# Patient Record
Sex: Male | Born: 1965 | Race: White | Hispanic: No | Marital: Married | Smoking: Never smoker
Health system: Southern US, Community
[De-identification: ages and names within clinical notes are randomized; demographics above are authoritative.]

## PROBLEM LIST (undated history)

## (undated) DIAGNOSIS — G43D0 Abdominal migraine, not intractable: Secondary | ICD-10-CM

## (undated) DIAGNOSIS — G4730 Sleep apnea, unspecified: Secondary | ICD-10-CM

## (undated) DIAGNOSIS — M5412 Radiculopathy, cervical region: Secondary | ICD-10-CM

## (undated) DIAGNOSIS — IMO0002 Reserved for concepts with insufficient information to code with codable children: Secondary | ICD-10-CM

## (undated) DIAGNOSIS — E119 Type 2 diabetes mellitus without complications: Secondary | ICD-10-CM

## (undated) DIAGNOSIS — R1115 Cyclical vomiting syndrome unrelated to migraine: Secondary | ICD-10-CM

## (undated) DIAGNOSIS — D582 Other hemoglobinopathies: Secondary | ICD-10-CM

## (undated) DIAGNOSIS — I10 Essential (primary) hypertension: Secondary | ICD-10-CM

## (undated) DIAGNOSIS — N289 Disorder of kidney and ureter, unspecified: Secondary | ICD-10-CM

## (undated) HISTORY — DX: Other hemoglobinopathies: D58.2

## (undated) HISTORY — DX: Radiculopathy, cervical region: M54.12

## (undated) HISTORY — PX: ESOPHAGOGASTRODUODENOSCOPY: SHX1529

---

## 2009-04-16 ENCOUNTER — Emergency Department (HOSPITAL_COMMUNITY): Admission: EM | Admit: 2009-04-16 | Discharge: 2009-04-17 | Payer: Self-pay | Admitting: Emergency Medicine

## 2009-04-18 ENCOUNTER — Emergency Department (HOSPITAL_COMMUNITY): Admission: EM | Admit: 2009-04-18 | Discharge: 2009-04-18 | Payer: Self-pay | Admitting: Emergency Medicine

## 2009-04-24 ENCOUNTER — Ambulatory Visit: Payer: Self-pay | Admitting: Gastroenterology

## 2009-04-24 ENCOUNTER — Inpatient Hospital Stay (HOSPITAL_COMMUNITY): Admission: EM | Admit: 2009-04-24 | Discharge: 2009-04-25 | Payer: Self-pay | Admitting: Emergency Medicine

## 2009-04-25 ENCOUNTER — Telehealth: Payer: Self-pay | Admitting: Gastroenterology

## 2009-04-25 ENCOUNTER — Ambulatory Visit: Payer: Self-pay | Admitting: Gastroenterology

## 2009-04-25 ENCOUNTER — Encounter: Payer: Self-pay | Admitting: Gastroenterology

## 2009-04-26 ENCOUNTER — Encounter: Payer: Self-pay | Admitting: Gastroenterology

## 2009-04-29 ENCOUNTER — Emergency Department (HOSPITAL_COMMUNITY): Admission: EM | Admit: 2009-04-29 | Discharge: 2009-04-29 | Payer: Self-pay | Admitting: Emergency Medicine

## 2009-04-30 ENCOUNTER — Telehealth (INDEPENDENT_AMBULATORY_CARE_PROVIDER_SITE_OTHER): Payer: Self-pay

## 2009-05-01 ENCOUNTER — Encounter (HOSPITAL_COMMUNITY): Admission: RE | Admit: 2009-05-01 | Discharge: 2009-05-31 | Payer: Self-pay | Admitting: Gastroenterology

## 2009-05-10 ENCOUNTER — Telehealth (INDEPENDENT_AMBULATORY_CARE_PROVIDER_SITE_OTHER): Payer: Self-pay | Admitting: *Deleted

## 2009-06-10 ENCOUNTER — Encounter: Payer: Self-pay | Admitting: Gastroenterology

## 2009-08-28 ENCOUNTER — Emergency Department (HOSPITAL_COMMUNITY): Admission: EM | Admit: 2009-08-28 | Discharge: 2009-08-28 | Payer: Self-pay | Admitting: Emergency Medicine

## 2010-05-14 ENCOUNTER — Ambulatory Visit: Admission: RE | Admit: 2010-05-14 | Discharge: 2010-05-14 | Payer: Self-pay | Admitting: Family Medicine

## 2010-06-19 ENCOUNTER — Ambulatory Visit (HOSPITAL_BASED_OUTPATIENT_CLINIC_OR_DEPARTMENT_OTHER)
Admission: RE | Admit: 2010-06-19 | Discharge: 2010-06-19 | Payer: Self-pay | Source: Home / Self Care | Attending: Neurology | Admitting: Neurology

## 2010-07-27 ENCOUNTER — Encounter: Payer: Self-pay | Admitting: Family Medicine

## 2010-08-05 NOTE — Progress Notes (Signed)
Summary: pt wants to speak with dr fields  Phone Note Call from Patient   Reason for Call: Talk to Nurse, Talk to Doctor Summary of Call: Pt called wanting to speak with Dr Darrick Penna. I told him that she was at hospital doing procedures today and offered him the nurses. He refused to speak to nurse. I took his number and told him i would let Dr Darrick Penna know that he had called.  [phone] Initial call taken by: Diana Eves,  May 10, 2009 10:03 AM     Appended Document: pt wants to speak with dr fields Please call him and let him know I am out of the office until NOV 22. Will call him when I return.  Appended Document: pt wants to speak with dr fields Called pt's home. His wife said he is at work. She asked me to call his cell #. I called [phone]. LM on VM for him to call. ( wife said she was not sure if he wanted [place] to make a referral for him to go elsewhere, or if he wanted to be seen earlier than his appt.)  Appended Document: pt wants to speak with dr fields His appt is 06/14/09.  Appended Document: pt wants to speak with dr fields Pt returned my call. York Spaniel he is not having the vomiting now. It was occurring after lunch every day. So he is not eating lunch, and he by passes the problem. When he eats later in the day, he does not get sick. His question is what is Dr. Darrick Penna next step, since his appt is in December? He expressed an interest in a referral to [facility], then said he didn't think he had to have a referral, that  he could make his own appt. I told him i will leave this message for Dr. Darrick Penna when she returns, in  the meantime if he has problems, or if he makes his own appt for [facility] to please let us know.  Appended Document: pt wants to speak with dr fields Spoke with pts wife. Pt being seen by Dr. Elvina Sidle at [facility]. Wife states records from [facility] did not make it to [facility]. Pt desires to follow with them. I personally called [facility] and confirmed the fax # is:  [phone]. Please fax all notes, labs, and procedure/path reports to [facility], ATTN: DR. Fulton Reek today.  Appended Document: pt wants to speak with dr fields I re-faxed everything again today to Dr Domenic Moras attention.

## 2010-08-05 NOTE — Progress Notes (Signed)
  Pt needs a GES in THE MORNING NEXT WEEK, Dx: vomiting. He will also need to go the lab and have a CORTISOL and TISSUE TRANSGLUTAMINASE IgA. West Bali MD  April 25, 2009 12:43 PM  Appended Document:  OPV in 2 mos, RE: vomiting and diarrhea. Call floor [phone] w/ appt. Pt in rm 314.  Appended Document:  Ov sch for 06/14/2009 @ 10:00 w/ SF.  Barbara on 300 at [facility] aware.  I also called the pt's home and spoke to wife and informed her to cx his Oct appt and informed her of Dec appt.  AS  Appended Document:  Per Dr. Darrick Penna, pt had labs at [facility].  Appended Document:  Pt is scheduled for GES on 05/01/09@8 :00am  Pt informed.

## 2010-08-05 NOTE — Consult Note (Signed)
Summary: Consultation Report  Consultation Report   Imported By: Diana Eves 06/10/2009 15:26:31  _____________________________________________________________________  External Attachment:    Type:   Image     Comment:   External Document

## 2010-08-05 NOTE — Letter (Signed)
Summary: GES ORDER  GES ORDER   Imported By: Elinor Parkinson 04/26/2009 12:35:26  _____________________________________________________________________  External Attachment:    Type:   Image     Comment:   External Document

## 2010-09-24 LAB — URINALYSIS, ROUTINE W REFLEX MICROSCOPIC
Bilirubin Urine: NEGATIVE
Glucose, UA: NEGATIVE mg/dL
Hgb urine dipstick: NEGATIVE
Ketones, ur: 40 mg/dL — AB
Leukocytes, UA: NEGATIVE
Nitrite: NEGATIVE
Protein, ur: 30 mg/dL — AB
Specific Gravity, Urine: 1.015 (ref 1.005–1.030)
Urobilinogen, UA: 0.2 mg/dL (ref 0.0–1.0)
pH: 8 (ref 5.0–8.0)

## 2010-09-24 LAB — COMPREHENSIVE METABOLIC PANEL
ALT: 100 U/L — ABNORMAL HIGH (ref 0–53)
AST: 48 U/L — ABNORMAL HIGH (ref 0–37)
Albumin: 4.4 g/dL (ref 3.5–5.2)
Alkaline Phosphatase: 87 U/L (ref 39–117)
BUN: 10 mg/dL (ref 6–23)
CO2: 22 mEq/L (ref 19–32)
Calcium: 10 mg/dL (ref 8.4–10.5)
Chloride: 105 mEq/L (ref 96–112)
Creatinine, Ser: 1.18 mg/dL (ref 0.4–1.5)
GFR calc Af Amer: >60 mL/min (ref >60)
GFR calc non Af Amer: >60 mL/min (ref >60)
Glucose, Bld: 179 mg/dL — ABNORMAL HIGH (ref 70–99)
Potassium: 4 mEq/L (ref 3.5–5.1)
Sodium: 141 mEq/L (ref 135–145)
Total Bilirubin: 1 mg/dL (ref 0.3–1.2)
Total Protein: 8.1 g/dL (ref 6.0–8.3)

## 2010-09-24 LAB — DIFFERENTIAL
Basophils Absolute: 0 10*3/uL (ref 0.0–0.1)
Basophils Relative: 0 % (ref 0–1)
Eosinophils Absolute: 0 10*3/uL (ref 0.0–0.7)
Eosinophils Relative: 0 % (ref 0–5)
Lymphocytes Relative: 10 % — ABNORMAL LOW (ref 12–46)
Lymphs Abs: 1.3 10*3/uL (ref 0.7–4.0)
Monocytes Absolute: 0.5 10*3/uL (ref 0.1–1.0)
Monocytes Relative: 4 % (ref 3–12)
Neutro Abs: 10.8 10*3/uL — ABNORMAL HIGH (ref 1.7–7.7)
Neutrophils Relative %: 86 % — ABNORMAL HIGH (ref 43–77)

## 2010-09-24 LAB — CBC
HCT: 47.9 % (ref 39.0–52.0)
Hemoglobin: 16.3 g/dL (ref 13.0–17.0)
MCHC: 34.1 g/dL (ref 30.0–36.0)
MCV: 86.4 fL (ref 78.0–100.0)
Platelets: 368 10*3/uL (ref 150–400)
RBC: 5.55 MIL/uL (ref 4.22–5.81)
RDW: 12.5 % (ref 11.5–15.5)
WBC: 12.6 10*3/uL — ABNORMAL HIGH (ref 4.0–10.5)

## 2010-09-24 LAB — URINE MICROSCOPIC-ADD ON

## 2010-09-24 LAB — LIPASE, BLOOD: Lipase: 21 U/L (ref 11–59)

## 2010-10-09 LAB — COMPREHENSIVE METABOLIC PANEL WITH GFR
ALT: 127 U/L — ABNORMAL HIGH (ref 0–53)
ALT: 85 U/L — ABNORMAL HIGH (ref 0–53)
AST: 51 U/L — ABNORMAL HIGH (ref 0–37)
AST: 58 U/L — ABNORMAL HIGH (ref 0–37)
Albumin: 4 g/dL (ref 3.5–5.2)
Albumin: 4.6 g/dL (ref 3.5–5.2)
Alkaline Phosphatase: 71 U/L (ref 39–117)
Alkaline Phosphatase: 77 U/L (ref 39–117)
BUN: 11 mg/dL (ref 6–23)
BUN: 15 mg/dL (ref 6–23)
CO2: 21 meq/L (ref 19–32)
CO2: 21 meq/L (ref 19–32)
Calcium: 9.2 mg/dL (ref 8.4–10.5)
Calcium: 9.4 mg/dL (ref 8.4–10.5)
Chloride: 104 meq/L (ref 96–112)
Chloride: 108 meq/L (ref 96–112)
Creatinine, Ser: 1.13 mg/dL (ref 0.4–1.5)
Creatinine, Ser: 1.17 mg/dL (ref 0.4–1.5)
GFR calc Af Amer: >60 mL/min (ref >60)
GFR calc Af Amer: >60 mL/min (ref >60)
GFR calc non Af Amer: >60 mL/min (ref >60)
GFR calc non Af Amer: >60 mL/min (ref >60)
Glucose, Bld: 147 mg/dL — ABNORMAL HIGH (ref 70–99)
Glucose, Bld: 175 mg/dL — ABNORMAL HIGH (ref 70–99)
Potassium: 3.4 meq/L — ABNORMAL LOW (ref 3.5–5.1)
Potassium: 3.6 meq/L (ref 3.5–5.1)
Sodium: 139 meq/L (ref 135–145)
Sodium: 139 meq/L (ref 135–145)
Total Bilirubin: 0.9 mg/dL (ref 0.3–1.2)
Total Bilirubin: 1.2 mg/dL (ref 0.3–1.2)
Total Protein: 7.4 g/dL (ref 6.0–8.3)
Total Protein: 7.9 g/dL (ref 6.0–8.3)

## 2010-10-09 LAB — DIFFERENTIAL
Basophils Absolute: 0 10*3/uL (ref 0.0–0.1)
Basophils Absolute: 0 10*3/uL (ref 0.0–0.1)
Basophils Absolute: 0 10*3/uL (ref 0.0–0.1)
Basophils Absolute: 0 10*3/uL (ref 0.0–0.1)
Basophils Absolute: 0 K/uL (ref 0.0–0.1)
Basophils Absolute: 0 K/uL (ref 0.0–0.1)
Basophils Relative: 0 % (ref 0–1)
Basophils Relative: 0 % (ref 0–1)
Basophils Relative: 0 % (ref 0–1)
Basophils Relative: 0 % (ref 0–1)
Basophils Relative: 0 % (ref 0–1)
Basophils Relative: 0 % (ref 0–1)
Eosinophils Absolute: 0 10*3/uL (ref 0.0–0.7)
Eosinophils Absolute: 0 10*3/uL (ref 0.0–0.7)
Eosinophils Absolute: 0 10*3/uL (ref 0.0–0.7)
Eosinophils Absolute: 0 K/uL (ref 0.0–0.7)
Eosinophils Absolute: 0 K/uL (ref 0.0–0.7)
Eosinophils Absolute: 0.1 10*3/uL (ref 0.0–0.7)
Eosinophils Relative: 0 % (ref 0–5)
Eosinophils Relative: 0 % (ref 0–5)
Eosinophils Relative: 0 % (ref 0–5)
Eosinophils Relative: 0 % (ref 0–5)
Eosinophils Relative: 0 % (ref 0–5)
Eosinophils Relative: 1 % (ref 0–5)
Lymphocytes Relative: 20 % (ref 12–46)
Lymphocytes Relative: 31 % (ref 12–46)
Lymphocytes Relative: 33 % (ref 12–46)
Lymphocytes Relative: 8 % — ABNORMAL LOW (ref 12–46)
Lymphocytes Relative: 9 % — ABNORMAL LOW (ref 12–46)
Lymphocytes Relative: 9 % — ABNORMAL LOW (ref 12–46)
Lymphs Abs: 0.8 10*3/uL (ref 0.7–4.0)
Lymphs Abs: 0.8 K/uL (ref 0.7–4.0)
Lymphs Abs: 0.8 K/uL (ref 0.7–4.0)
Lymphs Abs: 2 10*3/uL (ref 0.7–4.0)
Lymphs Abs: 2.9 10*3/uL (ref 0.7–4.0)
Lymphs Abs: 3 10*3/uL (ref 0.7–4.0)
Monocytes Absolute: 0.2 10*3/uL (ref 0.1–1.0)
Monocytes Absolute: 0.2 K/uL (ref 0.1–1.0)
Monocytes Absolute: 0.5 10*3/uL (ref 0.1–1.0)
Monocytes Absolute: 0.5 K/uL (ref 0.1–1.0)
Monocytes Absolute: 0.9 10*3/uL (ref 0.1–1.0)
Monocytes Absolute: 1 10*3/uL (ref 0.1–1.0)
Monocytes Relative: 10 % (ref 3–12)
Monocytes Relative: 11 % (ref 3–12)
Monocytes Relative: 2 % — ABNORMAL LOW (ref 3–12)
Monocytes Relative: 3 % (ref 3–12)
Monocytes Relative: 5 % (ref 3–12)
Monocytes Relative: 5 % (ref 3–12)
Neutro Abs: 5 10*3/uL (ref 1.7–7.7)
Neutro Abs: 5.4 10*3/uL (ref 1.7–7.7)
Neutro Abs: 7.3 10*3/uL (ref 1.7–7.7)
Neutro Abs: 7.9 K/uL — ABNORMAL HIGH (ref 1.7–7.7)
Neutro Abs: 8 K/uL — ABNORMAL HIGH (ref 1.7–7.7)
Neutro Abs: 8.6 10*3/uL — ABNORMAL HIGH (ref 1.7–7.7)
Neutrophils Relative %: 55 % (ref 43–77)
Neutrophils Relative %: 58 % (ref 43–77)
Neutrophils Relative %: 74 % (ref 43–77)
Neutrophils Relative %: 86 % — ABNORMAL HIGH (ref 43–77)
Neutrophils Relative %: 89 % — ABNORMAL HIGH (ref 43–77)
Neutrophils Relative %: 89 % — ABNORMAL HIGH (ref 43–77)

## 2010-10-09 LAB — CBC
HCT: 39.5 % (ref 39.0–52.0)
HCT: 43.2 % (ref 39.0–52.0)
HCT: 43.8 % (ref 39.0–52.0)
HCT: 44.3 % (ref 39.0–52.0)
HCT: 44.3 % (ref 39.0–52.0)
HCT: 45.9 % (ref 39.0–52.0)
Hemoglobin: 13.8 g/dL (ref 13.0–17.0)
Hemoglobin: 14.9 g/dL (ref 13.0–17.0)
Hemoglobin: 15.3 g/dL (ref 13.0–17.0)
Hemoglobin: 15.5 g/dL (ref 13.0–17.0)
Hemoglobin: 15.5 g/dL (ref 13.0–17.0)
Hemoglobin: 16.1 g/dL (ref 13.0–17.0)
MCHC: 34.5 g/dL (ref 30.0–36.0)
MCHC: 34.9 g/dL (ref 30.0–36.0)
MCHC: 35 g/dL (ref 30.0–36.0)
MCHC: 35 g/dL (ref 30.0–36.0)
MCHC: 35 g/dL (ref 30.0–36.0)
MCHC: 35 g/dL (ref 30.0–36.0)
MCV: 85.5 fL (ref 78.0–100.0)
MCV: 85.6 fL (ref 78.0–100.0)
MCV: 85.8 fL (ref 78.0–100.0)
MCV: 86.4 fL (ref 78.0–100.0)
MCV: 86.6 fL (ref 78.0–100.0)
MCV: 86.6 fL (ref 78.0–100.0)
Platelets: 259 10*3/uL (ref 150–400)
Platelets: 268 K/uL (ref 150–400)
Platelets: 298 10*3/uL (ref 150–400)
Platelets: 311 K/uL (ref 150–400)
Platelets: 327 K/uL (ref 150–400)
Platelets: 337 10*3/uL (ref 150–400)
RBC: 4.57 MIL/uL (ref 4.22–5.81)
RBC: 4.99 MIL/uL (ref 4.22–5.81)
RBC: 5.1 MIL/uL (ref 4.22–5.81)
RBC: 5.12 MIL/uL (ref 4.22–5.81)
RBC: 5.18 MIL/uL (ref 4.22–5.81)
RBC: 5.36 MIL/uL (ref 4.22–5.81)
RDW: 12.3 % (ref 11.5–15.5)
RDW: 12.6 % (ref 11.5–15.5)
RDW: 12.6 % (ref 11.5–15.5)
RDW: 12.6 % (ref 11.5–15.5)
RDW: 12.7 % (ref 11.5–15.5)
RDW: 12.8 % (ref 11.5–15.5)
WBC: 9.1 K/uL (ref 4.0–10.5)
WBC: 9.1 K/uL (ref 4.0–10.5)
WBC: 9.2 10*3/uL (ref 4.0–10.5)
WBC: 9.4 10*3/uL (ref 4.0–10.5)
WBC: 9.6 10*3/uL (ref 4.0–10.5)
WBC: 9.8 K/uL (ref 4.0–10.5)

## 2010-10-09 LAB — URINALYSIS, ROUTINE W REFLEX MICROSCOPIC
Bilirubin Urine: NEGATIVE
Bilirubin Urine: NEGATIVE
Glucose, UA: NEGATIVE mg/dL
Glucose, UA: NEGATIVE mg/dL
Glucose, UA: NEGATIVE mg/dL
Hgb urine dipstick: NEGATIVE
Hgb urine dipstick: NEGATIVE
Hgb urine dipstick: NEGATIVE
Ketones, ur: 40 mg/dL — AB
Ketones, ur: 40 mg/dL — AB
Ketones, ur: >80 mg/dL — AB
Leukocytes, UA: NEGATIVE
Nitrite: NEGATIVE
Nitrite: NEGATIVE
Nitrite: NEGATIVE
Protein, ur: 30 mg/dL — AB
Protein, ur: NEGATIVE mg/dL
Protein, ur: NEGATIVE mg/dL
Specific Gravity, Urine: 1.01 (ref 1.005–1.030)
Specific Gravity, Urine: 1.015 (ref 1.005–1.030)
Specific Gravity, Urine: 1.02 (ref 1.005–1.030)
Urobilinogen, UA: 0.2 mg/dL (ref 0.0–1.0)
Urobilinogen, UA: 0.2 mg/dL (ref 0.0–1.0)
Urobilinogen, UA: 0.2 mg/dL (ref 0.0–1.0)
pH: 6.5 (ref 5.0–8.0)
pH: 7.5 (ref 5.0–8.0)
pH: 8 (ref 5.0–8.0)

## 2010-10-09 LAB — HEPATITIS PANEL, ACUTE
HCV Ab: NEGATIVE
Hep A IgM: NEGATIVE
Hep B C IgM: NEGATIVE
Hepatitis B Surface Ag: NEGATIVE

## 2010-10-09 LAB — COMPREHENSIVE METABOLIC PANEL
ALT: 103 U/L — ABNORMAL HIGH (ref 0–53)
ALT: 81 U/L — ABNORMAL HIGH (ref 0–53)
ALT: 84 U/L — ABNORMAL HIGH (ref 0–53)
ALT: 85 U/L — ABNORMAL HIGH (ref 0–53)
AST: 35 U/L (ref 0–37)
AST: 36 U/L (ref 0–37)
AST: 49 U/L — ABNORMAL HIGH (ref 0–37)
AST: 50 U/L — ABNORMAL HIGH (ref 0–37)
Albumin: 3.5 g/dL (ref 3.5–5.2)
Albumin: 3.8 g/dL (ref 3.5–5.2)
Albumin: 4 g/dL (ref 3.5–5.2)
Albumin: 4.5 g/dL (ref 3.5–5.2)
Alkaline Phosphatase: 61 U/L (ref 39–117)
Alkaline Phosphatase: 66 U/L (ref 39–117)
Alkaline Phosphatase: 72 U/L (ref 39–117)
Alkaline Phosphatase: 72 U/L (ref 39–117)
BUN: 10 mg/dL (ref 6–23)
BUN: 12 mg/dL (ref 6–23)
BUN: 12 mg/dL (ref 6–23)
BUN: 7 mg/dL (ref 6–23)
CO2: 22 mEq/L (ref 19–32)
CO2: 22 mEq/L (ref 19–32)
CO2: 28 mEq/L (ref 19–32)
CO2: 28 mEq/L (ref 19–32)
Calcium: 8.4 mg/dL (ref 8.4–10.5)
Calcium: 8.9 mg/dL (ref 8.4–10.5)
Calcium: 9.2 mg/dL (ref 8.4–10.5)
Calcium: 9.7 mg/dL (ref 8.4–10.5)
Chloride: 106 mEq/L (ref 96–112)
Chloride: 107 mEq/L (ref 96–112)
Chloride: 108 mEq/L (ref 96–112)
Chloride: 108 mEq/L (ref 96–112)
Creatinine, Ser: 1.04 mg/dL (ref 0.4–1.5)
Creatinine, Ser: 1.04 mg/dL (ref 0.4–1.5)
Creatinine, Ser: 1.11 mg/dL (ref 0.4–1.5)
Creatinine, Ser: 1.13 mg/dL (ref 0.4–1.5)
GFR calc Af Amer: >60 mL/min (ref >60)
GFR calc Af Amer: >60 mL/min (ref >60)
GFR calc Af Amer: >60 mL/min (ref >60)
GFR calc Af Amer: >60 mL/min (ref >60)
GFR calc non Af Amer: >60 mL/min (ref >60)
GFR calc non Af Amer: >60 mL/min (ref >60)
GFR calc non Af Amer: >60 mL/min (ref >60)
GFR calc non Af Amer: >60 mL/min (ref >60)
Glucose, Bld: 100 mg/dL — ABNORMAL HIGH (ref 70–99)
Glucose, Bld: 103 mg/dL — ABNORMAL HIGH (ref 70–99)
Glucose, Bld: 158 mg/dL — ABNORMAL HIGH (ref 70–99)
Glucose, Bld: 171 mg/dL — ABNORMAL HIGH (ref 70–99)
Potassium: 3.6 mEq/L (ref 3.5–5.1)
Potassium: 4 mEq/L (ref 3.5–5.1)
Potassium: 4.2 mEq/L (ref 3.5–5.1)
Potassium: 4.2 mEq/L (ref 3.5–5.1)
Sodium: 139 mEq/L (ref 135–145)
Sodium: 139 mEq/L (ref 135–145)
Sodium: 140 mEq/L (ref 135–145)
Sodium: 140 mEq/L (ref 135–145)
Total Bilirubin: 0.8 mg/dL (ref 0.3–1.2)
Total Bilirubin: 1 mg/dL (ref 0.3–1.2)
Total Bilirubin: 1 mg/dL (ref 0.3–1.2)
Total Bilirubin: 1.2 mg/dL (ref 0.3–1.2)
Total Protein: 6.4 g/dL (ref 6.0–8.3)
Total Protein: 6.6 g/dL (ref 6.0–8.3)
Total Protein: 7.4 g/dL (ref 6.0–8.3)
Total Protein: 8 g/dL (ref 6.0–8.3)

## 2010-10-09 LAB — TISSUE TRANSGLUTAMINASE, IGA: Tissue Transglutaminase Ab, IgA: 0.6 U/mL (ref <7)

## 2010-10-09 LAB — BASIC METABOLIC PANEL WITH GFR
BUN: 12 mg/dL (ref 6–23)
CO2: 21 meq/L (ref 19–32)
Calcium: 9.4 mg/dL (ref 8.4–10.5)
Chloride: 107 meq/L (ref 96–112)
Creatinine, Ser: 1.08 mg/dL (ref 0.4–1.5)
GFR calc Af Amer: >60 mL/min (ref >60)
GFR calc non Af Amer: >60 mL/min (ref >60)
Glucose, Bld: 173 mg/dL — ABNORMAL HIGH (ref 70–99)
Potassium: 3.8 meq/L (ref 3.5–5.1)
Sodium: 140 meq/L (ref 135–145)

## 2010-10-09 LAB — LIPASE, BLOOD
Lipase: 17 U/L (ref 11–59)
Lipase: 18 U/L (ref 11–59)
Lipase: 19 U/L (ref 11–59)
Lipase: 20 U/L (ref 11–59)

## 2010-10-09 LAB — TSH: TSH: 0.399 u[IU]/mL (ref 0.350–4.500)

## 2010-10-09 LAB — PHOSPHORUS
Phosphorus: 1.3 mg/dL — ABNORMAL LOW (ref 2.3–4.6)
Phosphorus: 3 mg/dL (ref 2.3–4.6)

## 2010-10-09 LAB — LIPID PANEL
Cholesterol: 188 mg/dL (ref 0–200)
HDL: 42 mg/dL (ref >39)
LDL Cholesterol: 132 mg/dL — ABNORMAL HIGH (ref 0–99)
Total CHOL/HDL Ratio: 4.5 RATIO
Triglycerides: 72 mg/dL (ref <150)
VLDL: 14 mg/dL (ref 0–40)

## 2010-10-09 LAB — MAGNESIUM: Magnesium: 2 mg/dL (ref 1.5–2.5)

## 2010-10-09 LAB — CORTISOL: Cortisol, Plasma: 7.5 ug/dL

## 2010-10-09 LAB — URINE MICROSCOPIC-ADD ON

## 2010-10-09 LAB — AMYLASE: Amylase: 29 U/L (ref 27–131)

## 2010-10-09 LAB — PROTIME-INR
INR: 1.04 (ref 0.00–1.49)
Prothrombin Time: 13.5 seconds (ref 11.6–15.2)

## 2010-10-09 LAB — HEMOGLOBIN A1C
Hgb A1c MFr Bld: 5.6 % (ref 4.6–6.1)
Mean Plasma Glucose: 114 mg/dL

## 2010-10-09 LAB — APTT: aPTT: 25 seconds (ref 24–37)

## 2010-11-13 ENCOUNTER — Other Ambulatory Visit: Payer: Self-pay | Admitting: Family Medicine

## 2010-11-13 ENCOUNTER — Ambulatory Visit (HOSPITAL_COMMUNITY)
Admission: RE | Admit: 2010-11-13 | Discharge: 2010-11-13 | Disposition: A | Discharge status: Home / Self Care | Payer: Managed Care, Other (non HMO) | Source: Ambulatory Visit | Attending: Family Medicine | Admitting: Family Medicine

## 2010-11-13 DIAGNOSIS — G542 Cervical root disorders, not elsewhere classified: Secondary | ICD-10-CM

## 2010-11-13 DIAGNOSIS — M25519 Pain in unspecified shoulder: Secondary | ICD-10-CM | POA: Insufficient documentation

## 2010-11-13 DIAGNOSIS — R209 Unspecified disturbances of skin sensation: Secondary | ICD-10-CM | POA: Insufficient documentation

## 2010-11-13 DIAGNOSIS — M542 Cervicalgia: Secondary | ICD-10-CM | POA: Insufficient documentation

## 2011-07-09 ENCOUNTER — Inpatient Hospital Stay (HOSPITAL_COMMUNITY)
Admission: EM | Admit: 2011-07-09 | Discharge: 2011-07-10 | DRG: 392 | Disposition: A | Discharge status: Home / Self Care | Payer: Managed Care, Other (non HMO) | Attending: Family Medicine | Admitting: Family Medicine

## 2011-07-09 ENCOUNTER — Encounter: Payer: Self-pay | Admitting: *Deleted

## 2011-07-09 DIAGNOSIS — I10 Essential (primary) hypertension: Secondary | ICD-10-CM | POA: Diagnosis present

## 2011-07-09 DIAGNOSIS — E872 Acidosis, unspecified: Secondary | ICD-10-CM | POA: Diagnosis present

## 2011-07-09 DIAGNOSIS — R1115 Cyclical vomiting syndrome unrelated to migraine: Principal | ICD-10-CM | POA: Diagnosis present

## 2011-07-09 HISTORY — DX: Cyclical vomiting syndrome unrelated to migraine: R11.15

## 2011-07-09 LAB — BASIC METABOLIC PANEL
BUN: 18 mg/dL (ref 6–23)
CO2: 20 mEq/L (ref 19–32)
Calcium: 11.1 mg/dL — ABNORMAL HIGH (ref 8.4–10.5)
Chloride: 102 mEq/L (ref 96–112)
Creatinine, Ser: 1.07 mg/dL (ref 0.50–1.35)
GFR calc Af Amer: >90 mL/min (ref >90)
GFR calc non Af Amer: 83 mL/min — ABNORMAL LOW (ref >90)
Glucose, Bld: 184 mg/dL — ABNORMAL HIGH (ref 70–99)
Potassium: 4 mEq/L (ref 3.5–5.1)
Sodium: 138 mEq/L (ref 135–145)

## 2011-07-09 MED ORDER — MEPERIDINE HCL 50 MG/ML IJ SOLN
50.0000 mg | Freq: Once | INTRAMUSCULAR | Status: AC
  Administered 2011-07-09: 50 mg via INTRAVENOUS
  Filled 2011-07-09: qty 1

## 2011-07-09 MED ORDER — NALBUPHINE HCL 10 MG/ML IJ SOLN
10.0000 mg | INTRAMUSCULAR | Status: DC | PRN
  Filled 2011-07-09: qty 1

## 2011-07-09 MED ORDER — LORAZEPAM 2 MG/ML IJ SOLN
1.0000 mg | Freq: Once | INTRAMUSCULAR | Status: AC
  Administered 2011-07-09: 1 mg via INTRAVENOUS
  Filled 2011-07-09: qty 1

## 2011-07-09 MED ORDER — SODIUM CHLORIDE 0.9 % IV BOLUS (SEPSIS)
1000.0000 mL | Freq: Once | INTRAVENOUS | Status: AC
  Administered 2011-07-09: 1000 mL via INTRAVENOUS

## 2011-07-09 MED ORDER — SODIUM CHLORIDE 0.9 % IV SOLN
INTRAVENOUS | Status: DC
  Administered 2011-07-10: 01:00:00 via INTRAVENOUS

## 2011-07-09 MED ORDER — PROMETHAZINE HCL 25 MG/ML IJ SOLN
25.0000 mg | Freq: Once | INTRAMUSCULAR | Status: AC
  Administered 2011-07-09: 25 mg via INTRAVENOUS
  Filled 2011-07-09: qty 1

## 2011-07-09 NOTE — ED Notes (Signed)
MD at bedside. 

## 2011-07-09 NOTE — ED Notes (Signed)
Pt reports that he has vomited multiple times today. Pt states that he has spasms of epigastric abdominal pain that comes and goes. Pt rates pain 10/10. Pt wife states that "nothing works but nubain and protonix so don't waste your time." patient actively vomiting in the room at this time.

## 2011-07-09 NOTE — ED Notes (Signed)
Vomited mult times since 5 pm

## 2011-07-09 NOTE — ED Notes (Signed)
Vomiting at triage 

## 2011-07-09 NOTE — ED Provider Notes (Signed)
History  This chart was scribed for Ward Givens, MD by Bennett Scrape. This patient was seen in room APA03/APA03 and the patient's care was started at 10:29PM.  CSN: [id number]  Arrival date & time 07/09/11  2142   First MD Initiated Contact with Patient 07/09/11 2202      Chief Complaint  Patient presents with  . Emesis    The history is provided by the patient and the spouse. No language interpreter was used.    History obtained from wife because the patient actively vomiting  Patient has history of cyclic vomiting syndrome followed at [facility]. He relates he had an acute flareup starting today at 5 PM of multiple episodes of nausea, with upper abdominal pain that is a "10" but no diarrhea. He states the only thing that helps his pain and vomiting is phenergan and nubain. States if he gets anything else he doesn't get better. If nubain is unavailable he states to try demerol.    Pt's PCP is Dr. Lilyan Punt. Dr. Florentina Jenny at [facility] is his GI.   Past Medical History  Diagnosis Date  . Cyclical vomiting     History reviewed. No pertinent past surgical history.  History reviewed. No pertinent family history.  History  Substance Use Topics  . Smoking status: Never Smoker   . Smokeless tobacco: Not on file  . Alcohol Use: No  Lives with spouse    Review of Systems  A complete 10 system review of systems was obtained and is otherwise negative except as noted in the HPI.   Allergies  Review of patient's allergies indicates no known allergies.  Home Medications   Current Outpatient Rx  Name Route Sig Dispense Refill  . CYCLOBENZAPRINE HCL 10 MG PO TABS Oral Take 10 mg by mouth 3 (three) times daily as needed. For muscle relaxation     . HYDROCODONE-ACETAMINOPHEN 10-325 MG PO TABS Oral Take 1 tablet by mouth every 6 (six) hours as needed. For pain     . LISINOPRIL 20 MG PO TABS Oral Take 20 mg by mouth daily.      . SERTRALINE HCL PO Oral Take 1 tablet by mouth daily.        . TESTOSTERONE 4 MG/24HR TD PT24 Transdermal Place 2 patches onto the skin daily.        Triage Vitals: BP 123/88  Pulse 113  Temp 97.4 F (36.3 C)  Resp 24  SpO2 98% Vital signs normal    Physical Exam  Nursing note and vitals reviewed. Constitutional: He is oriented to person, place, and time. He appears well-developed and well-nourished.  Non-toxic appearance. He does not appear ill. No distress.       Actively vomiting  HENT:  Head: Normocephalic and atraumatic.  Right Ear: External ear normal.  Left Ear: External ear normal.  Nose: Nose normal. No mucosal edema or rhinorrhea.  Mouth/Throat: Oropharynx is clear and moist and mucous membranes are normal. No dental abscesses or uvula swelling.  Eyes: Conjunctivae and EOM are normal. Pupils are equal, round, and reactive to light.  Neck: Normal range of motion and full passive range of motion without pain. Neck supple.  Cardiovascular: Normal rate, regular rhythm and normal heart sounds.  Exam reveals no gallop and no friction rub.   No murmur heard. Pulmonary/Chest: Effort normal and breath sounds normal. No respiratory distress. He has no wheezes. He has no rhonchi. He has no rales. He exhibits no tenderness and no crepitus.  Abdominal: Soft.  Normal appearance and bowel sounds are normal. He exhibits no distension. There is tenderness. There is no rebound and no guarding.       He has mild upper tunnel discomfort to palpation  Musculoskeletal: Normal range of motion. He exhibits no edema and no tenderness.       Moves all extremities well.   Neurological: He is alert and oriented to person, place, and time. He has normal strength. No cranial nerve deficit.  Skin: Skin is warm, dry and intact. No rash noted. No erythema. No pallor.       Face is flushed  Psychiatric: His speech is normal and behavior is normal. His mood appears not anxious.       Pt  appears anxious    ED Course  Procedures (including critical care  time)  DIAGNOSTIC STUDIES: Oxygen Saturation is 98% on room air, normal by my interpretation.    COORDINATION OF CARE: 10:40PM-Discussed treatment plan with pt at bedside and pt agreed to plan. Pt is requesting nubain or demerol. 11:22PM-Discussed demerol treatment and pt agreed.  11:52PM-Pt rechecked and is doing better. 0100 pt told nurse he is waiting to get protonix, I was not told he wanted the protonix.   Results for orders placed during the hospital encounter of 07/09/11  BASIC METABOLIC PANEL      Component Value Range   Sodium 138  135 - 145 (mEq/L)   Potassium 4.0  3.5 - 5.1 (mEq/L)   Chloride 102  96 - 112 (mEq/L)   CO2 20  19 - 32 (mEq/L)   Glucose, Bld 184 (*) 70 - 99 (mg/dL)   BUN 18  6 - 23 (mg/dL)   Creatinine, Ser 1.61  0.50 - 1.35 (mg/dL)   Calcium 09.6 (*) 8.4 - 10.5 (mg/dL)   GFR calc non Af Amer 83 (*) >90 (mL/min)   GFR calc Af Amer >90  >90 (mL/min)   Laboratory interpretation all normal except hyperglycemia   Diagnoses that have been ruled out:  Diagnoses that are still under consideration:  Final diagnoses:  Cyclical vomiting    Plan discharge   MDM  I personally performed the services described in this documentation, which was scribed in my presence. The recorded information has been reviewed and considered. Devoria Albe, MD, Armando Gang         Ward Givens, MD 07/10/11 [phone]

## 2011-07-10 ENCOUNTER — Encounter (HOSPITAL_COMMUNITY): Payer: Self-pay | Admitting: *Deleted

## 2011-07-10 DIAGNOSIS — E872 Acidosis: Secondary | ICD-10-CM | POA: Diagnosis present

## 2011-07-10 DIAGNOSIS — R1115 Cyclical vomiting syndrome unrelated to migraine: Secondary | ICD-10-CM | POA: Diagnosis present

## 2011-07-10 DIAGNOSIS — I10 Essential (primary) hypertension: Secondary | ICD-10-CM | POA: Diagnosis present

## 2011-07-10 LAB — CBC
HCT: 51.5 % (ref 39.0–52.0)
Hemoglobin: 17.2 g/dL — ABNORMAL HIGH (ref 13.0–17.0)
MCH: 29.6 pg (ref 26.0–34.0)
MCHC: 33.4 g/dL (ref 30.0–36.0)
MCV: 88.5 fL (ref 78.0–100.0)
Platelets: 274 10*3/uL (ref 150–400)
RBC: 5.82 MIL/uL — ABNORMAL HIGH (ref 4.22–5.81)
RDW: 13.4 % (ref 11.5–15.5)
WBC: 11.2 10*3/uL — ABNORMAL HIGH (ref 4.0–10.5)

## 2011-07-10 LAB — COMPREHENSIVE METABOLIC PANEL
ALT: 41 U/L (ref 0–53)
AST: 22 U/L (ref 0–37)
Albumin: 3.9 g/dL (ref 3.5–5.2)
Alkaline Phosphatase: 55 U/L (ref 39–117)
BUN: 14 mg/dL (ref 6–23)
CO2: 27 mEq/L (ref 19–32)
Calcium: 9.8 mg/dL (ref 8.4–10.5)
Chloride: 107 mEq/L (ref 96–112)
Creatinine, Ser: 1.01 mg/dL (ref 0.50–1.35)
GFR calc Af Amer: >90 mL/min (ref >90)
GFR calc non Af Amer: 89 mL/min — ABNORMAL LOW (ref >90)
Glucose, Bld: 108 mg/dL — ABNORMAL HIGH (ref 70–99)
Potassium: 4.4 mEq/L (ref 3.5–5.1)
Sodium: 141 mEq/L (ref 135–145)
Total Bilirubin: 0.8 mg/dL (ref 0.3–1.2)
Total Protein: 7.6 g/dL (ref 6.0–8.3)

## 2011-07-10 LAB — MRSA PCR SCREENING: MRSA by PCR: NEGATIVE

## 2011-07-10 MED ORDER — CYCLOBENZAPRINE HCL 10 MG PO TABS: 10.0000 mg | ORAL_TABLET | Freq: Three times a day (TID) | ORAL | Status: DC | PRN

## 2011-07-10 MED ORDER — TESTOSTERONE 4 MG/24HR TD PT24: 2.0000 | MEDICATED_PATCH | Freq: Every day | TRANSDERMAL | Status: DC

## 2011-07-10 MED ORDER — HYDROMORPHONE HCL PF 1 MG/ML IJ SOLN
INTRAMUSCULAR | Status: AC
  Administered 2011-07-10: 2 mg via INTRAVENOUS
  Filled 2011-07-10: qty 2

## 2011-07-10 MED ORDER — HYDROMORPHONE HCL PF 1 MG/ML IJ SOLN
2.0000 mg | INTRAMUSCULAR | Status: DC | PRN
  Administered 2011-07-10: 2 mg via INTRAVENOUS

## 2011-07-10 MED ORDER — ONDANSETRON HCL 4 MG PO TABS: 4.0000 mg | ORAL_TABLET | Freq: Four times a day (QID) | ORAL | Status: DC | PRN

## 2011-07-10 MED ORDER — SERTRALINE HCL 50 MG PO TABS
25.0000 mg | ORAL_TABLET | Freq: Every day | ORAL | Status: DC
  Filled 2011-07-10: qty 1

## 2011-07-10 MED ORDER — DIPHENHYDRAMINE HCL 50 MG/ML IJ SOLN
25.0000 mg | Freq: Once | INTRAMUSCULAR | Status: AC
  Administered 2011-07-10: 25 mg via INTRAVENOUS
  Filled 2011-07-10: qty 1

## 2011-07-10 MED ORDER — PANTOPRAZOLE SODIUM 40 MG IV SOLR
40.0000 mg | Freq: Once | INTRAVENOUS | Status: AC
  Administered 2011-07-10: 40 mg via INTRAVENOUS
  Filled 2011-07-10: qty 40

## 2011-07-10 MED ORDER — MEPERIDINE HCL 50 MG/ML IJ SOLN
50.0000 mg | Freq: Once | INTRAMUSCULAR | Status: AC
  Administered 2011-07-10: 50 mg via INTRAVENOUS
  Filled 2011-07-10: qty 1

## 2011-07-10 MED ORDER — CALCIUM CARBONATE ANTACID 500 MG PO CHEW
1.0000 | CHEWABLE_TABLET | Freq: Three times a day (TID) | ORAL | Status: DC | PRN
  Administered 2011-07-10: 200 mg via ORAL
  Filled 2011-07-10: qty 1

## 2011-07-10 MED ORDER — POTASSIUM CHLORIDE IN NACL 20-0.9 MEQ/L-% IV SOLN
INTRAVENOUS | Status: DC
  Administered 2011-07-10: 06:00:00 via INTRAVENOUS

## 2011-07-10 MED ORDER — PROMETHAZINE HCL 25 MG/ML IJ SOLN: 25.0000 mg | Freq: Once | INTRAMUSCULAR | Status: DC

## 2011-07-10 MED ORDER — MEPERIDINE HCL 50 MG/ML IJ SOLN
25.0000 mg | Freq: Once | INTRAMUSCULAR | Status: AC
  Administered 2011-07-10: 25 mg via INTRAVENOUS
  Filled 2011-07-10: qty 1

## 2011-07-10 MED ORDER — LISINOPRIL 10 MG PO TABS
20.0000 mg | ORAL_TABLET | Freq: Every day | ORAL | Status: DC
  Administered 2011-07-10: 20 mg via ORAL
  Filled 2011-07-10: qty 2

## 2011-07-10 MED ORDER — PROMETHAZINE HCL 25 MG PO TABS: 25.0000 mg | ORAL_TABLET | Freq: Four times a day (QID) | ORAL | Status: AC | PRN

## 2011-07-10 MED ORDER — ONDANSETRON HCL 4 MG/2ML IJ SOLN
4.0000 mg | Freq: Four times a day (QID) | INTRAMUSCULAR | Status: DC | PRN
  Administered 2011-07-10: 4 mg via INTRAVENOUS

## 2011-07-10 MED ORDER — HYDROCODONE-ACETAMINOPHEN 10-325 MG PO TABS: 1.0000 | ORAL_TABLET | Freq: Four times a day (QID) | ORAL | Status: DC | PRN

## 2011-07-10 MED ORDER — PROMETHAZINE HCL 25 MG RE SUPP: 25.0000 mg | Freq: Four times a day (QID) | RECTAL | Status: AC | PRN

## 2011-07-10 MED ORDER — ALUM & MAG HYDROXIDE-SIMETH 200-200-20 MG/5ML PO SUSP: 30.0000 mL | Freq: Four times a day (QID) | ORAL | Status: DC | PRN

## 2011-07-10 MED ORDER — ONDANSETRON HCL 4 MG/2ML IJ SOLN
INTRAMUSCULAR | Status: AC
  Administered 2011-07-10: 4 mg via INTRAVENOUS
  Filled 2011-07-10: qty 2

## 2011-07-10 NOTE — H&P (Signed)
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 He normally has diarrhea that starts off with significant nausea vomiting and abdominal cramping. 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 His last hospitalization for this was back in July. He states that Nubain and Demerol really helpful he has an exacerbation. Unfortunately there is a Sport and exercise psychologist of Nubain right now and this hospital does not have any. He's received a significant amount of Demerol also in the emergency department over Protonix and is feeling better. He denies any blood in his stool or his vomit. Denies any fevers or recent illnesses. Denies any chest pain.  Review of Systems:  Otherwise negative  Past Medical History: Past Medical History  Diagnosis Date  . Cyclical vomiting    History reviewed. No pertinent past surgical history.  Medications: Prior to Admission medications   Medication Sig Start Date End Date Taking? Authorizing Provider  cyclobenzaprine (FLEXERIL) 10 MG tablet Take 10 mg by mouth 3 (three) times daily as needed. For muscle relaxation    Yes Historical Provider, MD  HYDROcodone-acetaminophen (NORCO) 10-325 MG per tablet Take 1 tablet by mouth every 6 (six) hours as needed. For pain    Yes Historical Provider, MD  lisinopril (PRINIVIL,ZESTRIL) 20 MG tablet Take 20 mg by mouth daily.     Yes Historical Provider, MD  SERTRALINE HCL PO Take 1 tablet by mouth daily.     Yes Historical Provider, MD  testosterone (ANDRODERM) 4 MG/24HR PT24 patch Place 2 patches onto the skin daily.     Yes Historical Provider, MD  promethazine (PHENERGAN) 25 MG suppository Place 1 suppository (25 mg total) rectally every 6 (six) hours as needed for nausea. 07/10/11 07/17/11  Ward Givens, MD  promethazine (PHENERGAN) 25 MG tablet Take 1 tablet (25 mg total) by mouth every 6 (six) hours as needed for nausea. 07/10/11 07/17/11  Ward Givens, MD    Allergies:  No Known Allergies  Social History:  reports that he has never smoked. He does not have any smokeless tobacco history on file. He reports that he does not drink alcohol or use illicit drugs.  Family History: History reviewed. No pertinent family history.  Physical Exam: Filed Vitals:   07/09/11 2156 07/09/11 2200 07/10/11 0300  BP: 123/88  162/94  Pulse: 113  90  Temp:  97.4 F (36.3 C)   Resp: 24  20  SpO2: 98%  99%   BP 162/94  Pulse 90  Temp 97.4 F (36.3 C)  Resp 20  SpO2 99% General appearance: alert, cooperative and no distress Lungs: clear to auscultation bilaterally Heart: regular rate and rhythm, S1, S2 normal, no murmur, click, rub or gallop Abdomen: soft, non-tender; bowel sounds normal; no masses,  no organomegaly Extremities: extremities normal, atraumatic, no cyanosis or edema Pulses: 2+ and symmetric Skin: Skin color, texture, turgor normal. No rashes or lesions Neurologic: Grossly normal  Labs on Admission:   [facility] 07/09/11 2241  NA 138  K 4.0  CL 102  CO2 20  GLUCOSE 184*  BUN 18  CREATININE 1.07  CALCIUM 11.1*  MG --  PHOS --     Radiological Exams on Admission: No results found.  Assessment/Plan Present on Admission:   46 year old male with exacerbation of cyclic vomiting syndrome #1 cyclic vomiting syndrome place him on Dilaudid and Zofran along with IV fluids. Provider with some times as he says this sometimes  helps. Provide with supportive measures.   Macy Lingenfelter [phone] 07/10/2011, 5:46 AM

## 2011-07-10 NOTE — Progress Notes (Addendum)
IV removed, site WNL.  Pt given d/c instructions and new prescriptions.  Discussed home care with patient and discussed home medications, patient verbalizes understanding. F/U appointments in place with his GI MD (per pt) in two weeks and pt will make his own appt with Dr. Gerda Diss, pt states they will keep appointment. Pt is stable at this time, no c/o N/V.Marland Kitchen Pt taken to main entrance in  by staff member (refused wheelchair).

## 2011-07-10 NOTE — ED Notes (Signed)
Pt requests "can you all give me more protonix or something?" EDP made aware orders received.

## 2011-07-10 NOTE — ED Notes (Signed)
Hospitalist at the bedside 

## 2011-07-10 NOTE — ED Provider Notes (Signed)
  Physical Exam  BP 123/88  Pulse 113  Temp 97.4 F (36.3 C)  Resp 24  SpO2 98%  Physical Exam  ED Course  Procedures  MDM Patient is continued to vomit. No relief with the anti-emetics and antacids and pain medicine. He'll be admitted to the hospital.      Juliet Rude. Rubin Payor, MD 07/10/11 [phone]

## 2011-07-10 NOTE — Discharge Summary (Signed)
DISCHARGE SUMMARY  Bryan Gilbert  MR#: [id number]  DOB:July 21, 1965  Date of Admission: 07/09/2011 Date of Discharge: 07/10/2011  Attending Physician:Wes Lezotte K  Patient's PCP: Lilyan Punt  Consults:  none  Discharge Diagnoses: Present on Admission:  .Cyclic vomiting syndrome .HTN (hypertension) .Metabolic acidosis    Discharge Medication List as of 07/10/2011 10:19 AM    START taking these medications   Details  promethazine (PHENERGAN) 25 MG suppository Place 1 suppository (25 mg total) rectally every 6 (six) hours as needed for nausea., Starting 07/10/2011, Until Fri 07/17/11, Print    promethazine (PHENERGAN) 25 MG tablet Take 1 tablet (25 mg total) by mouth every 6 (six) hours as needed for nausea., Starting 07/10/2011, Until Fri 07/17/11, Print      CONTINUE these medications which have NOT CHANGED   Details  cyclobenzaprine (FLEXERIL) 10 MG tablet Take 10 mg by mouth 3 (three) times daily as needed. For muscle relaxation , Until Discontinued, Historical Med    HYDROcodone-acetaminophen (NORCO) 10-325 MG per tablet Take 1 tablet by mouth every 6 (six) hours as needed. For pain , Until Discontinued, Historical Med    lisinopril (PRINIVIL,ZESTRIL) 20 MG tablet Take 20 mg by mouth daily.  , Until Discontinued, Historical Med    SERTRALINE HCL PO Take 1 tablet by mouth daily.  , Until Discontinued, Historical Med    testosterone (ANDRODERM) 4 MG/24HR PT24 patch Place 2 patches onto the skin daily.  , Until Discontinued, Historical Med          Hospital Course: Present on Admission:  .Cyclic vomiting syndrome: Patient is a 46 year old white male past medical history of hypertension and reported cyclic vomiting syndrome which has had an extensive workup and currently he's been followed by [facility] gastroenterology. Patient started having some episodes for the most part of yesterday to the point where he was unable to keep anything down and came into the emergency room. Multiple  attempts at giving him IV medications were unsuccessful. His respond in the past to pain medications. Because of symptoms were persisting, patient was admitted to the hospitalist service. He was started on IV fluids plus anti-emetics and narcotics. By the following day he was feeling much better. He was able to tolerate by mouth and he was able to be discharged home. No new medications were given her started on this patient.   Marland KitchenHTN (hypertension): Once patient is able start taking by mouth, and his blood pressure started to increase. Used to back on his lisinopril. This is stable.  .Metabolic acidosis: Patient was noted to have a mild elevated anion gap on admission of 16. This may been related to his vomiting. On IV fluids and repeat blood work in the morning, if his gap had resolved.   Day of Discharge BP 132/75  Pulse 113  Temp(Src) 97.8 F (36.6 C) (Oral)  Resp 18  Ht 5\' 6"  (1.676 m)  Wt 99.6 kg (219 lb 9.3 oz)  BMI 35.44 kg/m2  SpO2 95%  Physical Exam: General: Alert and oriented x3, no acute distress, looks stated age HEENT: Normocephalic, atraumatic mucous members are moist Cardiovascular: Regular rate and rhythm S1-S2 Lungs: Clear auscultation bilaterally Abdomen: Soft, nontender, nondistended, normoactive bowel sounds Extremities: No clubbing or cyanosis or edema  Results for orders placed during the hospital encounter of 07/09/11 (from the past 24 hour(s))  BASIC METABOLIC PANEL     Status: Abnormal   Collection Time   07/09/11 10:41 PM      Component Value Range  Sodium 138  135 - 145 (mEq/L)   Potassium 4.0  3.5 - 5.1 (mEq/L)   Chloride 102  96 - 112 (mEq/L)   CO2 20  19 - 32 (mEq/L)   Glucose, Bld 184 (*) 70 - 99 (mg/dL)   BUN 18  6 - 23 (mg/dL)   Creatinine, Ser 1.61  0.50 - 1.35 (mg/dL)   Calcium 09.6 (*) 8.4 - 10.5 (mg/dL)   GFR calc non Af Amer 83 (*) >90 (mL/min)   GFR calc Af Amer >90  >90 (mL/min)  MRSA PCR SCREENING     Status: Normal   Collection Time    07/10/11  5:56 AM      Component Value Range   MRSA by PCR NEGATIVE  NEGATIVE   CBC     Status: Abnormal   Collection Time   07/10/11  9:00 AM      Component Value Range   WBC 11.2 (*) 4.0 - 10.5 (K/uL)   RBC 5.82 (*) 4.22 - 5.81 (MIL/uL)   Hemoglobin 17.2 (*) 13.0 - 17.0 (g/dL)   HCT 04.5  40.9 - 81.1 (%)   MCV 88.5  78.0 - 100.0 (fL)   MCH 29.6  26.0 - 34.0 (pg)   MCHC 33.4  30.0 - 36.0 (g/dL)   RDW 91.4  78.2 - 95.6 (%)   Platelets 274  150 - 400 (K/uL)  COMPREHENSIVE METABOLIC PANEL     Status: Abnormal   Collection Time   07/10/11  9:00 AM      Component Value Range   Sodium 141  135 - 145 (mEq/L)   Potassium 4.4  3.5 - 5.1 (mEq/L)   Chloride 107  96 - 112 (mEq/L)   CO2 27  19 - 32 (mEq/L)   Glucose, Bld 108 (*) 70 - 99 (mg/dL)   BUN 14  6 - 23 (mg/dL)   Creatinine, Ser 2.13  0.50 - 1.35 (mg/dL)   Calcium 9.8  8.4 - 08.6 (mg/dL)   Total Protein 7.6  6.0 - 8.3 (g/dL)   Albumin 3.9  3.5 - 5.2 (g/dL)   AST 22  0 - 37 (U/L)   ALT 41  0 - 53 (U/L)   Alkaline Phosphatase 55  39 - 117 (U/L)   Total Bilirubin 0.8  0.3 - 1.2 (mg/dL)   GFR calc non Af Amer 89 (*) >90 (mL/min)   GFR calc Af Amer >90  >90 (mL/min)    Disposition: Improved, being discharged home   Follow-up Appts: Discharge Orders    Future Orders Please Complete By Expires   Diet - low sodium heart healthy      Increase activity slowly         Follow-up with Dr.Luking, PCP in in 3 weeks. Patient will follow up with [facility] gastroenterology at the end of the month-yet but he has a scheduled appointment  Tests Needing Follow-up: None  Time spent in discharge (includes decision making & examination of pt): 20  Signed: Sabine Tenenbaum K 07/10/2011, 11:53 AM

## 2011-07-10 NOTE — ED Notes (Signed)
Pt up to bathroom at this time

## 2011-07-11 ENCOUNTER — Encounter (HOSPITAL_COMMUNITY): Payer: Self-pay

## 2011-07-11 ENCOUNTER — Emergency Department (HOSPITAL_COMMUNITY)
Admission: EM | Admit: 2011-07-11 | Discharge: 2011-07-11 | Disposition: A | Discharge status: Home / Self Care | Payer: Managed Care, Other (non HMO) | Attending: Emergency Medicine | Admitting: Emergency Medicine

## 2011-07-11 DIAGNOSIS — R109 Unspecified abdominal pain: Secondary | ICD-10-CM | POA: Insufficient documentation

## 2011-07-11 DIAGNOSIS — Z79899 Other long term (current) drug therapy: Secondary | ICD-10-CM | POA: Insufficient documentation

## 2011-07-11 DIAGNOSIS — R1115 Cyclical vomiting syndrome unrelated to migraine: Secondary | ICD-10-CM

## 2011-07-11 DIAGNOSIS — I10 Essential (primary) hypertension: Secondary | ICD-10-CM | POA: Insufficient documentation

## 2011-07-11 DIAGNOSIS — R42 Dizziness and giddiness: Secondary | ICD-10-CM | POA: Insufficient documentation

## 2011-07-11 DIAGNOSIS — R10819 Abdominal tenderness, unspecified site: Secondary | ICD-10-CM | POA: Insufficient documentation

## 2011-07-11 HISTORY — DX: Reserved for concepts with insufficient information to code with codable children: IMO0002

## 2011-07-11 HISTORY — DX: Essential (primary) hypertension: I10

## 2011-07-11 LAB — COMPREHENSIVE METABOLIC PANEL
ALT: 36 U/L (ref 0–53)
AST: 25 U/L (ref 0–37)
Albumin: 4.3 g/dL (ref 3.5–5.2)
Alkaline Phosphatase: 57 U/L (ref 39–117)
BUN: 14 mg/dL (ref 6–23)
CO2: 20 mEq/L (ref 19–32)
Calcium: 10.3 mg/dL (ref 8.4–10.5)
Chloride: 104 mEq/L (ref 96–112)
Creatinine, Ser: 0.98 mg/dL (ref 0.50–1.35)
GFR calc Af Amer: >90 mL/min (ref >90)
GFR calc non Af Amer: >90 mL/min (ref >90)
Glucose, Bld: 136 mg/dL — ABNORMAL HIGH (ref 70–99)
Potassium: 3.8 mEq/L (ref 3.5–5.1)
Sodium: 140 mEq/L (ref 135–145)
Total Bilirubin: 0.9 mg/dL (ref 0.3–1.2)
Total Protein: 7.9 g/dL (ref 6.0–8.3)

## 2011-07-11 LAB — CBC
HCT: 54 % — ABNORMAL HIGH (ref 39.0–52.0)
Hemoglobin: 18.9 g/dL — ABNORMAL HIGH (ref 13.0–17.0)
MCH: 30.6 pg (ref 26.0–34.0)
MCHC: 35 g/dL (ref 30.0–36.0)
MCV: 87.5 fL (ref 78.0–100.0)
Platelets: 282 10*3/uL (ref 150–400)
RBC: 6.17 MIL/uL — ABNORMAL HIGH (ref 4.22–5.81)
RDW: 13.1 % (ref 11.5–15.5)
WBC: 12.5 10*3/uL — ABNORMAL HIGH (ref 4.0–10.5)

## 2011-07-11 LAB — LIPASE, BLOOD: Lipase: 29 U/L (ref 11–59)

## 2011-07-11 MED ORDER — ONDANSETRON HCL 4 MG/2ML IJ SOLN
4.0000 mg | Freq: Once | INTRAMUSCULAR | Status: AC
  Administered 2011-07-11: 4 mg via INTRAVENOUS
  Filled 2011-07-11: qty 2

## 2011-07-11 MED ORDER — SODIUM CHLORIDE 0.9 % IV BOLUS (SEPSIS)
1000.0000 mL | Freq: Once | INTRAVENOUS | Status: AC
  Administered 2011-07-11: 1000 mL via INTRAVENOUS

## 2011-07-11 MED ORDER — PANTOPRAZOLE SODIUM 40 MG IV SOLR
40.0000 mg | Freq: Once | INTRAVENOUS | Status: AC
  Administered 2011-07-11: 40 mg via INTRAVENOUS
  Filled 2011-07-11: qty 40

## 2011-07-11 MED ORDER — HYDROMORPHONE HCL PF 1 MG/ML IJ SOLN
2.0000 mg | Freq: Once | INTRAMUSCULAR | Status: AC
  Administered 2011-07-11: 2 mg via INTRAVENOUS
  Filled 2011-07-11: qty 2

## 2011-07-11 NOTE — ED Notes (Signed)
Received report agree with previous assessment 

## 2011-07-11 NOTE — ED Notes (Signed)
Pt presents with abdominal pain and vomiting since this AM. Pt has Hx of Cyclic Vomiting.

## 2011-07-11 NOTE — ED Provider Notes (Signed)
This chart was scribed for [employer]. Colon Branch, MD by Williemae Natter. The patient was seen in room APA10/AP[facility]10 and the patient's care was started at 3:40 PM.  CSN: [id number]  Arrival date & time 07/11/11  1424   First MD Initiated Contact with Patient 07/11/11 1505      Chief Complaint  Patient presents with  . Emesis  . Abdominal Pain    (Consider location/radiation/quality/duration/timing/severity/associated sxs/prior treatment) HPI Bryan Gilbert is a 46 y.o. male with a history of cyclic vomiting who presents to the Emergency Department complaining of profuse vomiting. Pts wife said pt had been complaining of abdominal pain and started vomiting early this morning. Pt also reported having neck pain that started around noon that was from a previous slipped disc injury. Pt stated that the pain does get better after he throws up but then progressively gets worse. Pt was recently seen in the emergency room for similar symptoms a few weeks ago. Wife stated that pt had been dizzy and dehydrated.  Gastroenterologist Dr. Fulton Reek PCP Dr. Gerda Diss  Past Medical History  Diagnosis Date  . Cyclical vomiting   . Hypertension   . Slipped intervertebral disc     History reviewed. No pertinent past surgical history.  No family history on file.  History  Substance Use Topics  . Smoking status: Never Smoker   . Smokeless tobacco: Not on file  . Alcohol Use: No      Review of Systems 10 Systems reviewed and are negative for acute change except as noted in the HPI.  Allergies  Review of patient's allergies indicates no known allergies.  Home Medications   Current Outpatient Rx  Name Route Sig Dispense Refill  . CYCLOBENZAPRINE HCL 10 MG PO TABS Oral Take 10 mg by mouth 3 (three) times daily as needed. For muscle relaxation     . HYDROCODONE-ACETAMINOPHEN 10-325 MG PO TABS Oral Take 1 tablet by mouth every 6 (six) hours as needed. For pain     . LISINOPRIL 20 MG PO TABS Oral Take 20 mg  by mouth daily.      Marland Kitchen PROMETHAZINE HCL 25 MG RE SUPP Rectal Place 1 suppository (25 mg total) rectally every 6 (six) hours as needed for nausea. 12 each 0  . PROMETHAZINE HCL 25 MG PO TABS Oral Take 1 tablet (25 mg total) by mouth every 6 (six) hours as needed for nausea. 8 tablet 0  . SERTRALINE HCL PO Oral Take 1 tablet by mouth daily.      . TESTOSTERONE 4 MG/24HR TD PT24 Transdermal Place 2 patches onto the skin daily.        BP 159/106  Pulse 96  Temp(Src) 98.5 F (36.9 C) (Oral)  Resp 22  Ht 5\' 6"  (1.676 m)  Wt 220 lb (99.791 kg)  BMI 35.51 kg/m2  SpO2 98%  Physical Exam  Nursing note and vitals reviewed. Constitutional: He is oriented to person, place, and time. He appears well-developed and well-nourished.  HENT:  Head: Normocephalic and atraumatic.  Neck: Normal range of motion. Neck supple.  Cardiovascular: Normal rate and regular rhythm.   Pulmonary/Chest: Effort normal and breath sounds normal.  Abdominal: There is tenderness. There is no rebound and no guarding.       Upper abdominal pain   Neurological: He is alert and oriented to person, place, and time.  Skin: Skin is warm and dry.    ED Course  Procedures (including critical care time) 4:56 PM Recheck Pt states that  pain has subsided. Pt has not been vomiting. Pt states that he is not ready to start eating yet. 7:00 PM Recheck Pt states that he is feeling much better and doesn't need any pain medication. Wanted the name of bentyl and levsin to discuss with their GI doctor.  Results for orders placed during the hospital encounter of 07/11/11  COMPREHENSIVE METABOLIC PANEL      Component Value Range   Sodium 140  135 - 145 (mEq/L)   Potassium 3.8  3.5 - 5.1 (mEq/L)   Chloride 104  96 - 112 (mEq/L)   CO2 20  19 - 32 (mEq/L)   Glucose, Bld 136 (*) 70 - 99 (mg/dL)   BUN 14  6 - 23 (mg/dL)   Creatinine, Ser 1.61  0.50 - 1.35 (mg/dL)   Calcium 09.6  8.4 - 10.5 (mg/dL)   Total Protein 7.9  6.0 - 8.3 (g/dL)    Albumin 4.3  3.5 - 5.2 (g/dL)   AST 25  0 - 37 (U/L)   ALT 36  0 - 53 (U/L)   Alkaline Phosphatase 57  39 - 117 (U/L)   Total Bilirubin 0.9  0.3 - 1.2 (mg/dL)   GFR calc non Af Amer >90  >90 (mL/min)   GFR calc Af Amer >90  >90 (mL/min)  LIPASE, BLOOD      Component Value Range   Lipase 29  11 - 59 (U/L)  CBC      Component Value Range   WBC 12.5 (*) 4.0 - 10.5 (K/uL)   RBC 6.17 (*) 4.22 - 5.81 (MIL/uL)   Hemoglobin 18.9 (*) 13.0 - 17.0 (g/dL)   HCT 04.5 (*) 40.9 - 52.0 (%)   MCV 87.5  78.0 - 100.0 (fL)   MCH 30.6  26.0 - 34.0 (pg)   MCHC 35.0  30.0 - 36.0 (g/dL)   RDW 81.1  91.4 - 78.2 (%)   Platelets 282  150 - 400 (K/uL)      MDM  Patient with h/o cyclical vomiting who began vomiting and abdominal pain this morning at 11. Usual treatment that results in relief is dilaudid. He is currently on zantac 300 mg daily. Labs are unremarkable. Given IVF x 2 liters, analgesics x 2, antiemetic and protonix with relief and resolution of vomiting.Pt feels improved after observation and/or treatment in ED.Pt stable in ED with no significant deterioration in condition.The patient appears reasonably screened and/or stabilized for discharge and I doubt any other medical condition or other [facility] requiring further screening, evaluation, or treatment in the ED at this time prior to discharge.  I personally performed the services described in this documentation, which was scribed in my presence. The recorded information has been reviewed and considered.  MDM Reviewed: previous chart, nursing note and vitals Interpretation: labs Total time providing critical care: 30.             Nicoletta Dress. Colon Branch, MD 07/11/11 1911

## 2011-07-13 ENCOUNTER — Emergency Department (HOSPITAL_COMMUNITY)
Admission: EM | Admit: 2011-07-13 | Discharge: 2011-07-13 | Disposition: A | Discharge status: Home / Self Care | Payer: Managed Care, Other (non HMO) | Attending: Emergency Medicine | Admitting: Emergency Medicine

## 2011-07-13 ENCOUNTER — Encounter (HOSPITAL_COMMUNITY): Payer: Self-pay | Admitting: Emergency Medicine

## 2011-07-13 DIAGNOSIS — R1115 Cyclical vomiting syndrome unrelated to migraine: Secondary | ICD-10-CM | POA: Insufficient documentation

## 2011-07-13 DIAGNOSIS — R197 Diarrhea, unspecified: Secondary | ICD-10-CM | POA: Insufficient documentation

## 2011-07-13 DIAGNOSIS — IMO0002 Reserved for concepts with insufficient information to code with codable children: Secondary | ICD-10-CM | POA: Insufficient documentation

## 2011-07-13 DIAGNOSIS — I10 Essential (primary) hypertension: Secondary | ICD-10-CM | POA: Insufficient documentation

## 2011-07-13 DIAGNOSIS — R109 Unspecified abdominal pain: Secondary | ICD-10-CM | POA: Insufficient documentation

## 2011-07-13 LAB — COMPREHENSIVE METABOLIC PANEL
ALT: 36 U/L (ref 0–53)
AST: 22 U/L (ref 0–37)
Albumin: 4.3 g/dL (ref 3.5–5.2)
Alkaline Phosphatase: 60 U/L (ref 39–117)
BUN: 13 mg/dL (ref 6–23)
CO2: 21 mEq/L (ref 19–32)
Calcium: 9.8 mg/dL (ref 8.4–10.5)
Chloride: 106 mEq/L (ref 96–112)
Creatinine, Ser: 0.99 mg/dL (ref 0.50–1.35)
GFR calc Af Amer: >90 mL/min (ref >90)
GFR calc non Af Amer: >90 mL/min (ref >90)
Glucose, Bld: 123 mg/dL — ABNORMAL HIGH (ref 70–99)
Potassium: 3.6 mEq/L (ref 3.5–5.1)
Sodium: 139 mEq/L (ref 135–145)
Total Bilirubin: 1.4 mg/dL — ABNORMAL HIGH (ref 0.3–1.2)
Total Protein: 7.9 g/dL (ref 6.0–8.3)

## 2011-07-13 LAB — URINALYSIS, ROUTINE W REFLEX MICROSCOPIC
Bilirubin Urine: NEGATIVE
Glucose, UA: NEGATIVE mg/dL
Hgb urine dipstick: NEGATIVE
Ketones, ur: 15 mg/dL — AB
Leukocytes, UA: NEGATIVE
Nitrite: NEGATIVE
Protein, ur: NEGATIVE mg/dL
Specific Gravity, Urine: 1.02 (ref 1.005–1.030)
Urobilinogen, UA: 0.2 mg/dL (ref 0.0–1.0)
pH: 8.5 — ABNORMAL HIGH (ref 5.0–8.0)

## 2011-07-13 LAB — CBC
HCT: 54.5 % — ABNORMAL HIGH (ref 39.0–52.0)
Hemoglobin: 19.1 g/dL — ABNORMAL HIGH (ref 13.0–17.0)
MCH: 30.2 pg (ref 26.0–34.0)
MCHC: 35 g/dL (ref 30.0–36.0)
MCV: 86.2 fL (ref 78.0–100.0)
Platelets: 285 10*3/uL (ref 150–400)
RBC: 6.32 MIL/uL — ABNORMAL HIGH (ref 4.22–5.81)
RDW: 12.8 % (ref 11.5–15.5)
WBC: 12.3 10*3/uL — ABNORMAL HIGH (ref 4.0–10.5)

## 2011-07-13 LAB — DIFFERENTIAL
Basophils Absolute: 0 10*3/uL (ref 0.0–0.1)
Basophils Relative: 0 % (ref 0–1)
Eosinophils Absolute: 0 10*3/uL (ref 0.0–0.7)
Eosinophils Relative: 0 % (ref 0–5)
Lymphocytes Relative: 12 % (ref 12–46)
Lymphs Abs: 1.5 10*3/uL (ref 0.7–4.0)
Monocytes Absolute: 0.9 10*3/uL (ref 0.1–1.0)
Monocytes Relative: 7 % (ref 3–12)
Neutro Abs: 9.8 10*3/uL — ABNORMAL HIGH (ref 1.7–7.7)
Neutrophils Relative %: 80 % — ABNORMAL HIGH (ref 43–77)

## 2011-07-13 LAB — LIPASE, BLOOD: Lipase: 30 U/L (ref 11–59)

## 2011-07-13 MED ORDER — HYDROMORPHONE HCL PF 1 MG/ML IJ SOLN
2.0000 mg | Freq: Once | INTRAMUSCULAR | Status: AC
  Administered 2011-07-13: 2 mg via INTRAVENOUS
  Filled 2011-07-13 (×2): qty 1

## 2011-07-13 MED ORDER — ONDANSETRON HCL 4 MG/2ML IJ SOLN
4.0000 mg | Freq: Once | INTRAMUSCULAR | Status: AC
  Administered 2011-07-13: 4 mg via INTRAVENOUS
  Filled 2011-07-13: qty 2

## 2011-07-13 MED ORDER — HYOSCYAMINE SULFATE 0.125 MG SL SUBL
0.2500 mg | SUBLINGUAL_TABLET | Freq: Once | SUBLINGUAL | Status: AC
  Administered 2011-07-13: 0.25 mg via SUBLINGUAL
  Filled 2011-07-13: qty 2

## 2011-07-13 MED ORDER — HYDROMORPHONE HCL PF 1 MG/ML IJ SOLN
1.0000 mg | Freq: Once | INTRAMUSCULAR | Status: AC
  Administered 2011-07-13: 1 mg via INTRAVENOUS
  Filled 2011-07-13: qty 1

## 2011-07-13 MED ORDER — OXYCODONE-ACETAMINOPHEN 5-325 MG PO TABS: 1.0000 | ORAL_TABLET | ORAL | Status: AC | PRN

## 2011-07-13 MED ORDER — PANTOPRAZOLE SODIUM 40 MG PO TBEC: 40.0000 mg | DELAYED_RELEASE_TABLET | Freq: Every day | ORAL | Status: DC

## 2011-07-13 MED ORDER — PANTOPRAZOLE SODIUM 20 MG PO TBEC: 40.0000 mg | DELAYED_RELEASE_TABLET | Freq: Every day | ORAL | Status: DC

## 2011-07-13 MED ORDER — PANTOPRAZOLE SODIUM 40 MG IV SOLR
40.0000 mg | Freq: Once | INTRAVENOUS | Status: AC
  Administered 2011-07-13: 40 mg via INTRAVENOUS
  Filled 2011-07-13: qty 40

## 2011-07-13 MED ORDER — HYDROMORPHONE HCL PF 1 MG/ML IJ SOLN: 1.0000 mg | Freq: Once | INTRAMUSCULAR | Status: DC

## 2011-07-13 MED ORDER — HYDROMORPHONE HCL PF 2 MG/ML IJ SOLN
2.0000 mg | Freq: Once | INTRAMUSCULAR | Status: AC
  Administered 2011-07-13: 2 mg via INTRAVENOUS
  Filled 2011-07-13 (×2): qty 1

## 2011-07-13 MED ORDER — ONDANSETRON HCL 8 MG PO TABS: 8.0000 mg | ORAL_TABLET | Freq: Three times a day (TID) | ORAL | Status: AC | PRN

## 2011-07-13 MED ORDER — SODIUM CHLORIDE 0.9 % IV BOLUS (SEPSIS)
1000.0000 mL | Freq: Once | INTRAVENOUS | Status: AC
  Administered 2011-07-13: 1000 mL via INTRAVENOUS

## 2011-07-13 NOTE — ED Notes (Signed)
Pt states feels better. Tolerating fluids at this time.

## 2011-07-13 NOTE — ED Notes (Signed)
Pt given a cup of ice at this time as pt refused liquids.

## 2011-07-13 NOTE — ED Notes (Signed)
Pt states has no relief from abd cramping and n/v. No N/V noted at this time.

## 2011-07-13 NOTE — ED Notes (Signed)
Pt admitted last Thursday for same. Pt states symptoms began again this am.

## 2011-07-13 NOTE — ED Notes (Signed)
Patient stated he did not want EKG done until someone could talk with his wife.  This test has been done previously and he does not feel it is necessary.  EKG does not pertain to his problem.  Notified nurse.

## 2011-07-13 NOTE — ED Provider Notes (Signed)
Pt improved Not vomiting at this time I had a long discussion with patient Will start protonix, stop zantac, start zofran, start percocet, f/u with his GI physician at [facility] Pt agreeable with plan He is resting comfortably at this time  Joya Gaskins, MD 07/13/11 1558

## 2011-07-13 NOTE — ED Notes (Signed)
Pt noted to be sitting [place] style in bed rocking back and forth, pt repeating " I need dilaudid it's the only thing that helps my N/V.  EDP notified.

## 2011-07-13 NOTE — ED Notes (Signed)
Pt asked "how much dilaudid are you giving me?"  Pt and spouse quick to state "needs 2 more mg" " and wouldn't be opposed to an admission". EDP notified.

## 2011-07-14 NOTE — ED Provider Notes (Signed)
History     CSN: [id number]  Arrival date & time 07/13/11  0946   First MD Initiated Contact with Patient 07/13/11 1026      Chief Complaint  Patient presents with  . Emesis    (Consider location/radiation/quality/duration/timing/severity/associated sxs/prior treatment) HPI Comments: Patient has 10 year history of cyclic vomiting syndrome, stating his GI tract "dumps" acid in the middle of the night,  Triggering these episodes.  He has been evaluated for Zollinger-Ellison,  But was negative.  He is actively followed by a GI physician at [facility] for this.  His symptoms have been relatively quiescent for the past 6 months,  But has had recurrent symptoms for the past week.  He was seen here 4 days ago,  And was admitted overnight,  Again 2 days ago,  But symptoms improved with IV fluids and anti-emetics,  So went home.  He woke up this morning feeling ok,  But as he was driving to work,  Uncontrolled vomiting occurred again.  He reports symptoms start with severe epigastric cramping.  He sometimes can get this to improve with hot tub soaks and home pain medicines,  Thus avoiding the vomiting cycles.  Home treatment was not successful today,  And has been constantly vomiting for the past 1+ hour.   Patient is a 46 y.o. male presenting with vomiting. The history is provided by the patient.  Emesis  This is a recurrent problem. The current episode started 3 to 5 hours ago. The problem occurs continuously. The problem has not changed since onset.The emesis has an appearance of bilious material. Associated symptoms include abdominal pain and diarrhea. Pertinent negatives include no arthralgias, no chills, no fever and no headaches. Associated symptoms comments: He had one episode of diarrhea this morning..    Past Medical History  Diagnosis Date  . Cyclical vomiting   . Hypertension   . Slipped intervertebral disc     History reviewed. No pertinent past surgical history.  No family history on  file.  History  Substance Use Topics  . Smoking status: Never Smoker   . Smokeless tobacco: Not on file  . Alcohol Use: No      Review of Systems  Constitutional: Negative for fever and chills.  HENT: Negative for congestion, sore throat and neck pain.   Eyes: Negative.   Respiratory: Negative for chest tightness and shortness of breath.   Cardiovascular: Negative for chest pain.  Gastrointestinal: Positive for vomiting, abdominal pain and diarrhea. Negative for nausea, blood in stool and abdominal distention.  Genitourinary: Negative.   Musculoskeletal: Negative for joint swelling and arthralgias.  Skin: Negative.  Negative for rash and wound.  Neurological: Negative for dizziness, weakness, light-headedness, numbness and headaches.  Hematological: Negative.   Psychiatric/Behavioral: Negative.     Allergies  Review of patient's allergies indicates no known allergies.  Home Medications   Current Outpatient Rx  Name Route Sig Dispense Refill  . CYCLOBENZAPRINE HCL 10 MG PO TABS Oral Take 10 mg by mouth 3 (three) times daily as needed. For muscle relaxation     . HYDROCODONE-ACETAMINOPHEN 10-325 MG PO TABS Oral Take 1 tablet by mouth 4 (four) times daily as needed. For pain    . LISINOPRIL 20 MG PO TABS Oral Take 20 mg by mouth daily.      Marland Kitchen PRAVASTATIN SODIUM 20 MG PO TABS Oral Take 20 mg by mouth daily.      Marland Kitchen PROMETHAZINE HCL 25 MG RE SUPP Rectal Place 1 suppository (25 mg  total) rectally every 6 (six) hours as needed for nausea. 12 each 0  . PROMETHAZINE HCL 25 MG PO TABS Oral Take 1 tablet (25 mg total) by mouth every 6 (six) hours as needed for nausea. 8 tablet 0  . SERTRALINE HCL 100 MG PO TABS Oral Take 100 mg by mouth daily.      . TESTOSTERONE 4 MG/24HR TD PT24 Transdermal Place 2 patches onto the skin daily.      Marland Kitchen ONDANSETRON HCL 8 MG PO TABS Oral Take 1 tablet (8 mg total) by mouth every 8 (eight) hours as needed for nausea. 20 tablet 0  . OXYCODONE-ACETAMINOPHEN  5-325 MG PO TABS Oral Take 1 tablet by mouth every 4 (four) hours as needed for pain. 20 tablet 0  . PANTOPRAZOLE SODIUM 40 MG PO TBEC Oral Take 1 tablet (40 mg total) by mouth daily. 30 tablet 0    BP 129/106  Pulse 98  Resp 17  Ht 5\' 6"  (1.676 m)  Wt 220 lb (99.791 kg)  BMI 35.51 kg/m2  SpO2 96%  Physical Exam  Nursing note and vitals reviewed. Constitutional: He is oriented to person, place, and time. He appears well-developed and well-nourished.  HENT:  Head: Normocephalic and atraumatic.  Eyes: Conjunctivae are normal.  Neck: Normal range of motion.  Cardiovascular: Normal rate, regular rhythm, normal heart sounds and intact distal pulses.   Pulmonary/Chest: Effort normal and breath sounds normal. He has no wheezes.  Abdominal: Soft. He exhibits no distension. There is no hepatosplenomegaly. There is tenderness in the epigastric area. There is no rebound and no guarding.       Actively vomiting at first presentation.    Musculoskeletal: Normal range of motion.  Neurological: He is alert and oriented to person, place, and time.  Skin: Skin is warm and dry.  Psychiatric: He has a normal mood and affect.    ED Course  Procedures (including critical care time)  Labs Reviewed  COMPREHENSIVE METABOLIC PANEL - Abnormal; Notable for the following:    Glucose, Bld 123 (*)    Total Bilirubin 1.4 (*)    All other components within normal limits  URINALYSIS, ROUTINE W REFLEX MICROSCOPIC - Abnormal; Notable for the following:    pH 8.5 (*)    Ketones, ur 15 (*)    All other components within normal limits  CBC - Abnormal; Notable for the following:    WBC 12.3 (*)    RBC 6.32 (*)    Hemoglobin 19.1 (*)    HCT 54.5 (*)    All other components within normal limits  DIFFERENTIAL - Abnormal; Notable for the following:    Neutrophils Relative 80 (*)    Neutro Abs 9.8 (*)    All other components within normal limits  LIPASE, BLOOD   No results found.   1. Cyclic vomiting  syndrome       MDM  Review of chart and per pt,  Only successful tx when sx are this severe is narcotics to relax gut muscles.  Patient given multiple doses of IV dilaudid, with finally obtaining pain relief and improvement in pain and vomiting.  IV fluids also given.  Zofran.  Patient requests trial of protonix in place of his zantac.  This was prescribed for him,  Along with zofran (has phenergan suppository at home as well).  F/u GI.         Candis Musa, PA 07/14/11 [phone]

## 2011-07-15 NOTE — ED Provider Notes (Signed)
Medical screening examination/treatment/procedure(s) were conducted as a shared visit with non-physician practitioner(s) and myself.  I personally evaluated the patient during the encounter   Joya Gaskins, MD 07/15/11 [phone]

## 2011-07-18 DIAGNOSIS — R1115 Cyclical vomiting syndrome unrelated to migraine: Secondary | ICD-10-CM | POA: Insufficient documentation

## 2011-07-18 DIAGNOSIS — Z79899 Other long term (current) drug therapy: Secondary | ICD-10-CM | POA: Insufficient documentation

## 2011-07-18 DIAGNOSIS — I10 Essential (primary) hypertension: Secondary | ICD-10-CM | POA: Insufficient documentation

## 2011-07-18 DIAGNOSIS — R109 Unspecified abdominal pain: Secondary | ICD-10-CM | POA: Insufficient documentation

## 2011-07-18 NOTE — ED Notes (Signed)
Pt sitting in the floor in front of registration, refusing to get in wheelchair and states i am supposed to skip triage and go straight to the back.

## 2011-07-19 ENCOUNTER — Emergency Department (HOSPITAL_COMMUNITY)
Admission: EM | Admit: 2011-07-19 | Discharge: 2011-07-19 | Disposition: A | Discharge status: Home / Self Care | Payer: Managed Care, Other (non HMO) | Attending: Emergency Medicine | Admitting: Emergency Medicine

## 2011-07-19 ENCOUNTER — Encounter (HOSPITAL_COMMUNITY): Payer: Self-pay | Admitting: Emergency Medicine

## 2011-07-19 DIAGNOSIS — R1115 Cyclical vomiting syndrome unrelated to migraine: Secondary | ICD-10-CM

## 2011-07-19 LAB — BASIC METABOLIC PANEL
BUN: 20 mg/dL (ref 6–23)
CO2: 21 mEq/L (ref 19–32)
Calcium: 10.7 mg/dL — ABNORMAL HIGH (ref 8.4–10.5)
Chloride: 101 mEq/L (ref 96–112)
Creatinine, Ser: 1.19 mg/dL (ref 0.50–1.35)
GFR calc Af Amer: 84 mL/min — ABNORMAL LOW (ref >90)
GFR calc non Af Amer: 73 mL/min — ABNORMAL LOW (ref >90)
Glucose, Bld: 170 mg/dL — ABNORMAL HIGH (ref 70–99)
Potassium: 3.8 mEq/L (ref 3.5–5.1)
Sodium: 137 mEq/L (ref 135–145)

## 2011-07-19 LAB — CBC
HCT: 55.7 % — ABNORMAL HIGH (ref 39.0–52.0)
Hemoglobin: 20.1 g/dL — ABNORMAL HIGH (ref 13.0–17.0)
MCH: 30.8 pg (ref 26.0–34.0)
MCHC: 36.1 g/dL — ABNORMAL HIGH (ref 30.0–36.0)
MCV: 85.3 fL (ref 78.0–100.0)
Platelets: 324 10*3/uL (ref 150–400)
RBC: 6.53 MIL/uL — ABNORMAL HIGH (ref 4.22–5.81)
RDW: 12.8 % (ref 11.5–15.5)
WBC: 15 10*3/uL — ABNORMAL HIGH (ref 4.0–10.5)

## 2011-07-19 MED ORDER — SODIUM CHLORIDE 0.9 % IV BOLUS (SEPSIS)
1000.0000 mL | Freq: Once | INTRAVENOUS | Status: AC
  Administered 2011-07-19: 1000 mL via INTRAVENOUS

## 2011-07-19 MED ORDER — HYDROMORPHONE HCL PF 1 MG/ML IJ SOLN: 1.0000 mg | Freq: Once | INTRAMUSCULAR | Status: AC

## 2011-07-19 MED ORDER — HYDROMORPHONE HCL PF 1 MG/ML IJ SOLN: 2.0000 mg | Freq: Once | INTRAMUSCULAR | Status: AC

## 2011-07-19 MED ORDER — HYDROMORPHONE HCL PF 2 MG/ML IJ SOLN
INTRAMUSCULAR | Status: AC
  Administered 2011-07-19: 1 mg via INTRAVENOUS
  Filled 2011-07-19: qty 1

## 2011-07-19 MED ORDER — SODIUM CHLORIDE 0.9 % IV SOLN: INTRAVENOUS | Status: DC

## 2011-07-19 MED ORDER — ONDANSETRON HCL 4 MG/2ML IJ SOLN
4.0000 mg | Freq: Once | INTRAMUSCULAR | Status: AC
  Administered 2011-07-19: 4 mg via INTRAVENOUS
  Filled 2011-07-19: qty 2

## 2011-07-19 MED ORDER — SODIUM CHLORIDE 0.9 % IV BOLUS (SEPSIS): 250.0000 mL | Freq: Once | INTRAVENOUS | Status: AC

## 2011-07-19 MED ORDER — HYDROMORPHONE HCL PF 2 MG/ML IJ SOLN
INTRAMUSCULAR | Status: AC
  Administered 2011-07-19: 2 mg via INTRAVENOUS
  Filled 2011-07-19: qty 1

## 2011-07-19 MED ORDER — HYDROMORPHONE HCL PF 2 MG/ML IJ SOLN
2.0000 mg | Freq: Once | INTRAMUSCULAR | Status: AC
  Administered 2011-07-19 (×2): 2 mg via INTRAVENOUS
  Administered 2011-07-19: 1 mg via INTRAVENOUS
  Filled 2011-07-19: qty 1

## 2011-07-19 NOTE — ED Notes (Signed)
Patient here for recurrent vomiting that has been going on for about a week per patient and patient wife. Patient has taken prescriptions that were prescribed, not helping. Pain and cramping in upper abdomen.

## 2011-07-19 NOTE — ED Provider Notes (Signed)
History     CSN: [id number]  Arrival date & time 07/18/11  2358   First MD Initiated Contact with Patient 07/19/11 [phone]      Chief Complaint  Patient presents with  . Emesis    (Consider location/radiation/quality/duration/timing/severity/associated sxs/prior treatment) Patient is a 46 y.o. male presenting with vomiting. The history is provided by the patient.  Emesis  The current episode started 3 to 5 hours ago. The problem occurs continuously. The problem has not changed since onset.The emesis has an appearance of stomach contents. There has been no fever. Associated symptoms include abdominal pain. Pertinent negatives include no cough, no diarrhea, no fever, no headaches, no myalgias and no URI.   Patient with history of cyclical vomiting syndrome has been struggling with it lately this month was doing better in the past few days and then this evening at about 1930 onset of abdominal pain cramping and recurrent vomiting.  Followed by GI. Current symptoms are typical for his syndrome. Past Medical History  Diagnosis Date  . Cyclical vomiting   . Hypertension   . Slipped intervertebral disc     History reviewed. No pertinent past surgical history.  Family History  Problem Relation Age of Onset  . Heart failure Father   . Hypertension Father     History  Substance Use Topics  . Smoking status: Never Smoker   . Smokeless tobacco: Not on file  . Alcohol Use: No      Review of Systems  Constitutional: Negative for fever.  HENT: Negative for congestion and neck pain.   Eyes: Negative for visual disturbance.  Respiratory: Negative for cough.   Cardiovascular: Negative for chest pain.  Gastrointestinal: Positive for vomiting and abdominal pain. Negative for diarrhea.  Genitourinary: Negative for dysuria.  Musculoskeletal: Negative for myalgias and back pain.  Neurological: Negative for headaches.  Hematological: Does not bruise/bleed easily.    Allergies  Review  of patient's allergies indicates no known allergies.  Home Medications   Current Outpatient Rx  Name Route Sig Dispense Refill  . CYCLOBENZAPRINE HCL 10 MG PO TABS Oral Take 10 mg by mouth 3 (three) times daily as needed. For muscle relaxation     . HYDROCODONE-ACETAMINOPHEN 10-325 MG PO TABS Oral Take 1 tablet by mouth 4 (four) times daily as needed. For pain    . LISINOPRIL 20 MG PO TABS Oral Take 20 mg by mouth daily.      Marland Kitchen ONDANSETRON HCL 8 MG PO TABS Oral Take 1 tablet (8 mg total) by mouth every 8 (eight) hours as needed for nausea. 20 tablet 0  . OXYCODONE-ACETAMINOPHEN 5-325 MG PO TABS Oral Take 1 tablet by mouth every 4 (four) hours as needed for pain. 20 tablet 0  . PANTOPRAZOLE SODIUM 40 MG PO TBEC Oral Take 1 tablet (40 mg total) by mouth daily. 30 tablet 0  . PRAVASTATIN SODIUM 20 MG PO TABS Oral Take 20 mg by mouth daily.      . SERTRALINE HCL 100 MG PO TABS Oral Take 100 mg by mouth daily.      . TESTOSTERONE 4 MG/24HR TD PT24 Transdermal Place 2 patches onto the skin daily.        BP 141/92  Pulse 102  Temp 98.4 F (36.9 C)  Resp 16  Ht 5\' 7"  (1.702 m)  Wt 220 lb (99.791 kg)  BMI 34.46 kg/m2  SpO2 91%  Physical Exam  Nursing note and vitals reviewed. Constitutional: He is oriented to person, place, and  time. He appears well-developed and well-nourished. He appears distressed.  HENT:  Head: Normocephalic and atraumatic.  Mouth/Throat: Oropharynx is clear and moist.  Eyes: Conjunctivae and EOM are normal. Pupils are equal, round, and reactive to light.  Neck: Normal range of motion. Neck supple.  Cardiovascular: Normal rate, regular rhythm, normal heart sounds and intact distal pulses.   No murmur heard. Pulmonary/Chest: Effort normal and breath sounds normal.  Abdominal: Soft. Bowel sounds are normal. There is no tenderness.  Musculoskeletal: Normal range of motion. He exhibits no edema.  Neurological: He is alert and oriented to person, place, and time. No  cranial nerve deficit. He exhibits normal muscle tone. Coordination normal.  Skin: Skin is warm. No rash noted.    ED Course  Procedures (including critical care time)  Labs Reviewed  CBC - Abnormal; Notable for the following:    WBC 15.0 (*)    RBC 6.53 (*)    Hemoglobin 20.1 (*)    HCT 55.7 (*)    MCHC 36.1 (*)    All other components within normal limits  BASIC METABOLIC PANEL - Abnormal; Notable for the following:    Glucose, Bld 170 (*)    Calcium 10.7 (*)    GFR calc non Af Amer 73 (*)    GFR calc Af Amer 84 (*)    All other components within normal limits   No results found. Results for orders placed during the hospital encounter of 07/19/11  CBC      Component Value Range   WBC 15.0 (*) 4.0 - 10.5 (K/uL)   RBC 6.53 (*) 4.22 - 5.81 (MIL/uL)   Hemoglobin 20.1 (*) 13.0 - 17.0 (g/dL)   HCT 53.2 (*) 99.2 - 52.0 (%)   MCV 85.3  78.0 - 100.0 (fL)   MCH 30.8  26.0 - 34.0 (pg)   MCHC 36.1 (*) 30.0 - 36.0 (g/dL)   RDW 42.6  83.4 - 19.6 (%)   Platelets 324  150 - 400 (K/uL)  BASIC METABOLIC PANEL      Component Value Range   Sodium 137  135 - 145 (mEq/L)   Potassium 3.8  3.5 - 5.1 (mEq/L)   Chloride 101  96 - 112 (mEq/L)   CO2 21  19 - 32 (mEq/L)   Glucose, Bld 170 (*) 70 - 99 (mg/dL)   BUN 20  6 - 23 (mg/dL)   Creatinine, Ser 2.22  0.50 - 1.35 (mg/dL)   Calcium 97.9 (*) 8.4 - 10.5 (mg/dL)   GFR calc non Af Amer 73 (*) >90 (mL/min)   GFR calc Af Amer 84 (*) >90 (mL/min)     1. Cyclic vomiting syndrome       MDM   Patient with history of cyclical vomiting syndrome improved with 5 mg of dilaudid. Feeling much better cramping and vomiting syndrome has improved and resolved. Patient labs without significant changes from baseline he has followup with GI available.        Shelda Jakes, MD 07/19/11 [phone]

## 2012-02-08 ENCOUNTER — Encounter (HOSPITAL_COMMUNITY): Payer: Self-pay | Admitting: *Deleted

## 2012-02-08 ENCOUNTER — Emergency Department (HOSPITAL_COMMUNITY)
Admission: EM | Admit: 2012-02-08 | Discharge: 2012-02-08 | Disposition: A | Discharge status: Home / Self Care | Payer: Managed Care, Other (non HMO) | Attending: Emergency Medicine | Admitting: Emergency Medicine

## 2012-02-08 DIAGNOSIS — R1115 Cyclical vomiting syndrome unrelated to migraine: Secondary | ICD-10-CM | POA: Insufficient documentation

## 2012-02-08 DIAGNOSIS — Z79899 Other long term (current) drug therapy: Secondary | ICD-10-CM | POA: Insufficient documentation

## 2012-02-08 DIAGNOSIS — I10 Essential (primary) hypertension: Secondary | ICD-10-CM | POA: Insufficient documentation

## 2012-02-08 LAB — CBC WITH DIFFERENTIAL/PLATELET
Basophils Absolute: 0 10*3/uL (ref 0.0–0.1)
Basophils Relative: 0 % (ref 0–1)
Eosinophils Absolute: 0 10*3/uL (ref 0.0–0.7)
Eosinophils Relative: 0 % (ref 0–5)
HCT: 51 % (ref 39.0–52.0)
Hemoglobin: 18.6 g/dL — ABNORMAL HIGH (ref 13.0–17.0)
Lymphocytes Relative: 12 % (ref 12–46)
Lymphs Abs: 1.8 10*3/uL (ref 0.7–4.0)
MCH: 30.7 pg (ref 26.0–34.0)
MCHC: 36.5 g/dL — ABNORMAL HIGH (ref 30.0–36.0)
MCV: 84.3 fL (ref 78.0–100.0)
Monocytes Absolute: 0.8 10*3/uL (ref 0.1–1.0)
Monocytes Relative: 5 % (ref 3–12)
Neutro Abs: 13.4 10*3/uL — ABNORMAL HIGH (ref 1.7–7.7)
Neutrophils Relative %: 84 % — ABNORMAL HIGH (ref 43–77)
Platelets: 315 10*3/uL (ref 150–400)
RBC: 6.05 MIL/uL — ABNORMAL HIGH (ref 4.22–5.81)
RDW: 12.2 % (ref 11.5–15.5)
WBC: 16 10*3/uL — ABNORMAL HIGH (ref 4.0–10.5)

## 2012-02-08 LAB — COMPREHENSIVE METABOLIC PANEL
ALT: 45 U/L (ref 0–53)
AST: 22 U/L (ref 0–37)
Albumin: 4.5 g/dL (ref 3.5–5.2)
Alkaline Phosphatase: 67 U/L (ref 39–117)
BUN: 23 mg/dL (ref 6–23)
CO2: 21 mEq/L (ref 19–32)
Calcium: 10.7 mg/dL — ABNORMAL HIGH (ref 8.4–10.5)
Chloride: 101 mEq/L (ref 96–112)
Creatinine, Ser: 1.02 mg/dL (ref 0.50–1.35)
GFR calc Af Amer: >90 mL/min (ref >90)
GFR calc non Af Amer: 87 mL/min — ABNORMAL LOW (ref >90)
Glucose, Bld: 177 mg/dL — ABNORMAL HIGH (ref 70–99)
Potassium: 4.1 mEq/L (ref 3.5–5.1)
Sodium: 139 mEq/L (ref 135–145)
Total Bilirubin: 1.2 mg/dL (ref 0.3–1.2)
Total Protein: 8.2 g/dL (ref 6.0–8.3)

## 2012-02-08 LAB — LIPASE, BLOOD: Lipase: 21 U/L (ref 11–59)

## 2012-02-08 MED ORDER — LORAZEPAM 2 MG/ML IJ SOLN
1.0000 mg | Freq: Once | INTRAMUSCULAR | Status: AC
  Administered 2012-02-08: 1 mg via INTRAVENOUS
  Filled 2012-02-08: qty 1

## 2012-02-08 MED ORDER — HYDROMORPHONE HCL PF 2 MG/ML IJ SOLN
2.0000 mg | Freq: Once | INTRAMUSCULAR | Status: AC
  Administered 2012-02-08: 2 mg via INTRAVENOUS
  Filled 2012-02-08: qty 1

## 2012-02-08 MED ORDER — PROMETHAZINE HCL 25 MG/ML IJ SOLN
25.0000 mg | Freq: Once | INTRAMUSCULAR | Status: AC
  Administered 2012-02-08: 25 mg via INTRAVENOUS
  Filled 2012-02-08: qty 1

## 2012-02-08 MED ORDER — SODIUM CHLORIDE 0.9 % IV BOLUS (SEPSIS)
1000.0000 mL | Freq: Once | INTRAVENOUS | Status: AC
  Administered 2012-02-08: 1000 mL via INTRAVENOUS

## 2012-02-08 MED ORDER — PANTOPRAZOLE SODIUM 20 MG PO TBEC: 20.0000 mg | DELAYED_RELEASE_TABLET | Freq: Every day | ORAL | Status: DC

## 2012-02-08 NOTE — ED Notes (Signed)
Unable to obtain temperature on patient due to vomiting.

## 2012-02-08 NOTE — ED Notes (Signed)
Vomiting since 130 pm. abd pain.

## 2012-02-08 NOTE — ED Provider Notes (Signed)
History     CSN: [id number]  Arrival date & time 02/08/12  1552   First MD Initiated Contact with Patient 02/08/12 1629      Chief Complaint  Patient presents with  . Emesis    (Consider location/radiation/quality/duration/timing/severity/associated sxs/prior treatment) HPI Comments: Patient has history of cyclic vomiting syndrome presenting with a typical exacerbation of the same since 1:30 PM say with nausea vomiting abdominal pain. He denies any fever, chest pain, shortness of breath, change in bowel habits. He follows at to gastroenterology and Dr. Gerda Diss. His wife has a letter from Dr. Gerda Diss  stating he must have IV Dilaudid for these exacerbations.  The history is provided by the patient and the spouse.    Past Medical History  Diagnosis Date  . Cyclical vomiting   . Hypertension   . Slipped intervertebral disc     History reviewed. No pertinent past surgical history.  Family History  Problem Relation Age of Onset  . Heart failure Father   . Hypertension Father     History  Substance Use Topics  . Smoking status: Never Smoker   . Smokeless tobacco: Not on file  . Alcohol Use: No      Review of Systems  Constitutional: Negative for fever, activity change and appetite change.  HENT: Negative for congestion and rhinorrhea.   Eyes: Negative for visual disturbance.  Respiratory: Negative for cough, chest tightness and shortness of breath.   Cardiovascular: Negative for chest pain.  Gastrointestinal: Positive for nausea, vomiting and abdominal pain.  Genitourinary: Negative for dysuria and hematuria.  Musculoskeletal: Negative for back pain.  Skin: Negative for rash.  Neurological: Negative for dizziness, weakness and headaches.    Allergies  Review of patient's allergies indicates no known allergies.  Home Medications   Current Outpatient Rx  Name Route Sig Dispense Refill  . CYCLOBENZAPRINE HCL 10 MG PO TABS Oral Take 10 mg by mouth 3 (three) times  daily as needed. For muscle relaxation     . HYDROCODONE-ACETAMINOPHEN 10-325 MG PO TABS Oral Take 1 tablet by mouth 4 (four) times daily as needed. For pain    . HYDROMORPHONE HCL 2 MG PO TABS Oral Take 2 mg by mouth daily as needed. For pain    . LISINOPRIL 20 MG PO TABS Oral Take 20 mg by mouth every evening.     Marland Kitchen PANTOPRAZOLE SODIUM 40 MG PO TBEC Oral Take 40 mg by mouth every evening.    Marland Kitchen PRAVASTATIN SODIUM 20 MG PO TABS Oral Take 20 mg by mouth every evening.     . TESTOSTERONE 10 MG/ACT (2%) TD GEL Transdermal Place 2 application onto the skin daily. *Apply two pumps topically to lower extremity once daily*    . PANTOPRAZOLE SODIUM 20 MG PO TBEC Oral Take 1 tablet (20 mg total) by mouth daily. 30 tablet 0    BP 139/86  Pulse 118  Resp 20  Ht 5\' 7"  (1.702 m)  Wt 220 lb (99.791 kg)  BMI 34.46 kg/m2  SpO2 98%  Physical Exam  Constitutional: He is oriented to person, place, and time. He appears well-developed and well-nourished. He appears distressed.       Hunched over  HENT:  Head: Normocephalic and atraumatic.  Mouth/Throat: Oropharynx is clear and moist. No oropharyngeal exudate.  Eyes: Pupils are equal, round, and reactive to light.  Neck: Normal range of motion. Neck supple.  Cardiovascular: Normal rate, regular rhythm and normal heart sounds.   Pulmonary/Chest: Effort normal and breath  sounds normal. No respiratory distress.  Abdominal: Soft. There is tenderness. There is no rebound and no guarding.       Epigastric tenderness with guarding  Musculoskeletal: Normal range of motion. He exhibits no edema and no tenderness.  Neurological: He is alert and oriented to person, place, and time. No cranial nerve deficit.  Skin: Skin is warm.    ED Course  Procedures (including critical care time)  Labs Reviewed  CBC WITH DIFFERENTIAL - Abnormal; Notable for the following:    WBC 16.0 (*)     RBC 6.05 (*)     Hemoglobin 18.6 (*)     MCHC 36.5 (*)     Neutrophils  Relative 84 (*)     Neutro Abs 13.4 (*)     All other components within normal limits  COMPREHENSIVE METABOLIC PANEL - Abnormal; Notable for the following:    Glucose, Bld 177 (*)     Calcium 10.7 (*)     GFR calc non Af Amer 87 (*)     All other components within normal limits  LIPASE, BLOOD  URINALYSIS, ROUTINE W REFLEX MICROSCOPIC   No results found.   1. Cyclic vomiting syndrome       MDM  Acute and chronic abdominal pain with nausea and vomiting. Vital stable, mild tachycardia. Abdomen soft and nonsurgical.  IV fluids, symptom control, reassess  Reassessed. Patient feeling more comfortable after medications. He does have leukocytosis similar to previous.  Symptoms improved with multiple rounds of medications. Patient feeling back to baseline and tolerating by mouth. Follow up with gastroenterologist at [facility].     Glynn Octave, MD 02/08/12 2008

## 2012-02-09 ENCOUNTER — Emergency Department (HOSPITAL_COMMUNITY)
Admission: EM | Admit: 2012-02-09 | Discharge: 2012-02-09 | Disposition: A | Discharge status: Home / Self Care | Payer: Managed Care, Other (non HMO) | Attending: Emergency Medicine | Admitting: Emergency Medicine

## 2012-02-09 ENCOUNTER — Encounter (HOSPITAL_COMMUNITY): Payer: Self-pay

## 2012-02-09 DIAGNOSIS — R1115 Cyclical vomiting syndrome unrelated to migraine: Secondary | ICD-10-CM

## 2012-02-09 DIAGNOSIS — R109 Unspecified abdominal pain: Secondary | ICD-10-CM | POA: Insufficient documentation

## 2012-02-09 DIAGNOSIS — I10 Essential (primary) hypertension: Secondary | ICD-10-CM | POA: Insufficient documentation

## 2012-02-09 DIAGNOSIS — R112 Nausea with vomiting, unspecified: Secondary | ICD-10-CM | POA: Insufficient documentation

## 2012-02-09 MED ORDER — GI COCKTAIL ~~LOC~~
30.0000 mL | Freq: Once | ORAL | Status: AC
  Administered 2012-02-09: 30 mL via ORAL
  Filled 2012-02-09: qty 30

## 2012-02-09 MED ORDER — HYDROMORPHONE HCL PF 2 MG/ML IJ SOLN
2.0000 mg | Freq: Once | INTRAMUSCULAR | Status: AC
  Administered 2012-02-09: 2 mg via INTRAVENOUS
  Filled 2012-02-09: qty 1

## 2012-02-09 MED ORDER — SODIUM CHLORIDE 0.9 % IV SOLN
Freq: Once | INTRAVENOUS | Status: AC
  Administered 2012-02-09: 11:00:00 via INTRAVENOUS

## 2012-02-09 MED ORDER — SODIUM CHLORIDE 0.9 % IV BOLUS (SEPSIS)
1000.0000 mL | Freq: Once | INTRAVENOUS | Status: AC
  Administered 2012-02-09: 1000 mL via INTRAVENOUS

## 2012-02-09 MED ORDER — LORAZEPAM 2 MG/ML IJ SOLN
1.0000 mg | Freq: Once | INTRAMUSCULAR | Status: AC
  Administered 2012-02-09: 1 mg via INTRAVENOUS
  Filled 2012-02-09: qty 1

## 2012-02-09 NOTE — ED Notes (Signed)
Pt seen in er yesterday for same, vomiting returned today "stomach dumped acid and now sick"

## 2012-02-09 NOTE — ED Notes (Signed)
Pt refused oral temp.  

## 2012-02-09 NOTE — ED Notes (Signed)
Pt made aware that he will need a ride to be discharged. Pt calling wife at this time for her to return. Pt denies pain. Airway patent. Pt resting comfortably.

## 2012-02-09 NOTE — ED Notes (Signed)
EMD at patients bedside.

## 2012-02-09 NOTE — ED Notes (Signed)
EMD at pt bedside.

## 2012-02-09 NOTE — ED Notes (Signed)
Pt requesting another 2mg  of Dilaudid. Pt states "it's usually the second shot that really helps." Pt vitals are stable. Airway patent. Pt resting in bed. Wife states that "he is coherent now to talk to the doctor." Md notified.

## 2012-02-09 NOTE — ED Provider Notes (Signed)
History    This chart was scribed for Bryan B. Bernette Mayers, MD, MD by Smitty Pluck. The patient was seen in room APA01 and the patient's care was started at 9:25AM.   CSN: [id number]  Arrival date & time 02/09/12  0913   First MD Initiated Contact with Patient 02/09/12 [phone]      Chief Complaint  Patient presents with  . Abdominal Pain  . Nausea  . Emesis    (Consider location/radiation/quality/duration/timing/severity/associated sxs/prior treatment) Patient is a 46 y.o. male presenting with abdominal pain and vomiting. The history is provided by the spouse and medical records. History Limited By: Pt refuses to talk until given pain medictions.  Abdominal Pain The primary symptoms of the illness include abdominal pain and vomiting.  Emesis  Associated symptoms include abdominal pain.   Navid Lenzen Stanhope is a 46 y.o. male who presents to the Emergency Department complaining of constant, moderate nausea, emesis and abdominal pain. Pt was in the ED 1 day ago for same symptoms and had labs. Pt was given 6 mg of dilaudid while in ED 1 day ago with relief. Pt's symptoms returned today. Pt has hx of similar symptoms. He was in ED 4x for same symptoms in January (he was admitted).  Past Medical History  Diagnosis Date  . Cyclical vomiting   . Hypertension   . Slipped intervertebral disc     History reviewed. No pertinent past surgical history.  Family History  Problem Relation Age of Onset  . Heart failure Father   . Hypertension Father     History  Substance Use Topics  . Smoking status: Never Smoker   . Smokeless tobacco: Not on file  . Alcohol Use: No      Review of Systems  Gastrointestinal: Positive for vomiting and abdominal pain.  All other systems reviewed and are negative.  10 Systems reviewed and all are negative for acute change except as noted in the HPI.   Allergies  Review of patient's allergies indicates no known allergies.  Home Medications   Current  Outpatient Rx  Name Route Sig Dispense Refill  . CYCLOBENZAPRINE HCL 10 MG PO TABS Oral Take 10 mg by mouth 3 (three) times daily as needed. For muscle relaxation     . HYDROCODONE-ACETAMINOPHEN 10-325 MG PO TABS Oral Take 1 tablet by mouth 4 (four) times daily as needed. For pain    . HYDROMORPHONE HCL 2 MG PO TABS Oral Take 2 mg by mouth daily as needed. For pain    . LISINOPRIL 20 MG PO TABS Oral Take 20 mg by mouth every evening.     Marland Kitchen PANTOPRAZOLE SODIUM 20 MG PO TBEC Oral Take 1 tablet (20 mg total) by mouth daily. 30 tablet 0  . PANTOPRAZOLE SODIUM 40 MG PO TBEC Oral Take 40 mg by mouth every evening.    Marland Kitchen PRAVASTATIN SODIUM 20 MG PO TABS Oral Take 20 mg by mouth every evening.     . TESTOSTERONE 10 MG/ACT (2%) TD GEL Transdermal Place 2 application onto the skin daily. *Apply two pumps topically to lower extremity once daily*      BP 136/87  Pulse 94  Resp 20  Ht 5\' 7"  (1.702 m)  Wt 215 lb (97.523 kg)  BMI 33.67 kg/m2  SpO2 100%  Physical Exam  Nursing note and vitals reviewed. Constitutional: He is oriented to person, place, and time. He appears well-developed and well-nourished.  HENT:  Head: Normocephalic and atraumatic.  Eyes: EOM are normal. Pupils  are equal, round, and reactive to light.  Neck: Normal range of motion. Neck supple.  Cardiovascular: Normal rate, normal heart sounds and intact distal pulses.   Pulmonary/Chest: Effort normal and breath sounds normal.  Abdominal:       Refuses abdominal exam   Musculoskeletal: Normal range of motion. He exhibits no edema and no tenderness.  Neurological: He is alert and oriented to person, place, and time. He has normal strength. No cranial nerve deficit or sensory deficit.  Skin: Skin is warm and dry. No rash noted.  Psychiatric: He has a normal mood and affect.    ED Course  Procedures (including critical care time) DIAGNOSTIC STUDIES: Oxygen Saturation is 100% on room air, normal by my interpretation.     COORDINATION OF CARE: 9:30AM EDP discusses pt ED treatment with pt  9:30AM EDP orders medication:  Scheduled Meds:    .  HYDROmorphone (DILAUDID) injection  2 mg Intravenous Once  . LORazepam  1 mg Intravenous Once  . sodium chloride  1,000 mL Intravenous Once   Continuous Infusions:  PRN Meds:.      Labs Reviewed - No data to display No results found.   No diagnosis found.    MDM  Pt sitting hunched over a pillow on the stretcher refuses to cooperate with history or physical exam until given pain medications. Here for same yesterday and given 6mg  of dilaudid total with relief.    I personally performed the services described in the documentation, which were scribed in my presence. The recorded information has been reviewed and considered.    10:27 AM Pt now allowing abdominal exam which is normal, benign, nonsurgical. His pain is improved. I have not seen any vomiting in the ED. He will attempt PO trial now.        Bryan B. Bernette Mayers, MD 02/09/12 2130

## 2012-02-09 NOTE — ED Notes (Signed)
Pt made aware that it is illegal to drive since he received 4mg  Dilaudid within the last 4 hours. Pt states that "I am walking to the [employer] to meet my wife and I don't even have a car here." Pt has patent airway. Denies pain at this time. Pain is stable and ambulatory. No Rx given at this visit. Pt instructed to return to ED if syx worsen.

## 2012-02-09 NOTE — ED Notes (Signed)
Pt requests an antacid. EMD made aware.

## 2012-02-11 ENCOUNTER — Encounter (HOSPITAL_COMMUNITY): Payer: Self-pay | Admitting: *Deleted

## 2012-02-11 ENCOUNTER — Observation Stay (HOSPITAL_COMMUNITY)
Admission: EM | Admit: 2012-02-11 | Discharge: 2012-02-12 | Disposition: A | Discharge status: Home / Self Care | Payer: Managed Care, Other (non HMO) | Attending: Internal Medicine | Admitting: Internal Medicine

## 2012-02-11 DIAGNOSIS — G4733 Obstructive sleep apnea (adult) (pediatric): Secondary | ICD-10-CM | POA: Insufficient documentation

## 2012-02-11 DIAGNOSIS — D45 Polycythemia vera: Secondary | ICD-10-CM | POA: Insufficient documentation

## 2012-02-11 DIAGNOSIS — E860 Dehydration: Secondary | ICD-10-CM | POA: Insufficient documentation

## 2012-02-11 DIAGNOSIS — D751 Secondary polycythemia: Secondary | ICD-10-CM

## 2012-02-11 DIAGNOSIS — R7989 Other specified abnormal findings of blood chemistry: Secondary | ICD-10-CM | POA: Diagnosis present

## 2012-02-11 DIAGNOSIS — R739 Hyperglycemia, unspecified: Secondary | ICD-10-CM | POA: Diagnosis present

## 2012-02-11 DIAGNOSIS — R1115 Cyclical vomiting syndrome unrelated to migraine: Principal | ICD-10-CM | POA: Insufficient documentation

## 2012-02-11 DIAGNOSIS — R7309 Other abnormal glucose: Secondary | ICD-10-CM | POA: Insufficient documentation

## 2012-02-11 DIAGNOSIS — E872 Acidosis: Secondary | ICD-10-CM

## 2012-02-11 DIAGNOSIS — I10 Essential (primary) hypertension: Secondary | ICD-10-CM | POA: Diagnosis present

## 2012-02-11 HISTORY — DX: Sleep apnea, unspecified: G47.30

## 2012-02-11 LAB — COMPREHENSIVE METABOLIC PANEL
ALT: 43 U/L (ref 0–53)
AST: 23 U/L (ref 0–37)
Albumin: 4.4 g/dL (ref 3.5–5.2)
Alkaline Phosphatase: 65 U/L (ref 39–117)
BUN: 16 mg/dL (ref 6–23)
CO2: 21 mEq/L (ref 19–32)
Calcium: 10.4 mg/dL (ref 8.4–10.5)
Chloride: 100 mEq/L (ref 96–112)
Creatinine, Ser: 1.02 mg/dL (ref 0.50–1.35)
GFR calc Af Amer: >90 mL/min (ref >90)
GFR calc non Af Amer: 87 mL/min — ABNORMAL LOW (ref >90)
Glucose, Bld: 171 mg/dL — ABNORMAL HIGH (ref 70–99)
Potassium: 3.8 mEq/L (ref 3.5–5.1)
Sodium: 137 mEq/L (ref 135–145)
Total Bilirubin: 1.3 mg/dL — ABNORMAL HIGH (ref 0.3–1.2)
Total Protein: 8 g/dL (ref 6.0–8.3)

## 2012-02-11 LAB — CBC WITH DIFFERENTIAL/PLATELET
Basophils Absolute: 0 10*3/uL (ref 0.0–0.1)
Basophils Relative: 0 % (ref 0–1)
Eosinophils Absolute: 0 10*3/uL (ref 0.0–0.7)
Eosinophils Relative: 0 % (ref 0–5)
HCT: 50.1 % (ref 39.0–52.0)
Hemoglobin: 18 g/dL — ABNORMAL HIGH (ref 13.0–17.0)
Lymphocytes Relative: 13 % (ref 12–46)
Lymphs Abs: 1.6 10*3/uL (ref 0.7–4.0)
MCH: 30.4 pg (ref 26.0–34.0)
MCHC: 35.9 g/dL (ref 30.0–36.0)
MCV: 84.6 fL (ref 78.0–100.0)
Monocytes Absolute: 0.8 10*3/uL (ref 0.1–1.0)
Monocytes Relative: 6 % (ref 3–12)
Neutro Abs: 9.4 10*3/uL — ABNORMAL HIGH (ref 1.7–7.7)
Neutrophils Relative %: 80 % — ABNORMAL HIGH (ref 43–77)
Platelets: 296 10*3/uL (ref 150–400)
RBC: 5.92 MIL/uL — ABNORMAL HIGH (ref 4.22–5.81)
RDW: 12.3 % (ref 11.5–15.5)
WBC: 11.8 10*3/uL — ABNORMAL HIGH (ref 4.0–10.5)

## 2012-02-11 LAB — TSH: TSH: 0.338 u[IU]/mL — ABNORMAL LOW (ref 0.350–4.500)

## 2012-02-11 LAB — LIPASE, BLOOD: Lipase: 21 U/L (ref 11–59)

## 2012-02-11 MED ORDER — LISINOPRIL 10 MG PO TABS
20.0000 mg | ORAL_TABLET | Freq: Every evening | ORAL | Status: DC
  Administered 2012-02-11: 20 mg via ORAL
  Filled 2012-02-11: qty 2

## 2012-02-11 MED ORDER — SODIUM CHLORIDE 0.9 % IJ SOLN
INTRAMUSCULAR | Status: AC
  Filled 2012-02-11: qty 3

## 2012-02-11 MED ORDER — LISINOPRIL 10 MG PO TABS
20.0000 mg | ORAL_TABLET | Freq: Every evening | ORAL | Status: DC
  Filled 2012-02-11: qty 2

## 2012-02-11 MED ORDER — HYDROCODONE-ACETAMINOPHEN 5-325 MG PO TABS: 1.0000 | ORAL_TABLET | ORAL | Status: DC | PRN

## 2012-02-11 MED ORDER — ALUM & MAG HYDROXIDE-SIMETH 200-200-20 MG/5ML PO SUSP
30.0000 mL | Freq: Four times a day (QID) | ORAL | Status: DC | PRN
  Administered 2012-02-11: 30 mL via ORAL
  Filled 2012-02-11: qty 30

## 2012-02-11 MED ORDER — ONDANSETRON HCL 4 MG/2ML IJ SOLN
4.0000 mg | Freq: Once | INTRAMUSCULAR | Status: AC
  Administered 2012-02-11: 4 mg via INTRAVENOUS
  Filled 2012-02-11: qty 2

## 2012-02-11 MED ORDER — TESTOSTERONE 10 MG/ACT (2%) TD GEL: 2.0000 "application " | Freq: Every day | TRANSDERMAL | Status: DC

## 2012-02-11 MED ORDER — ONDANSETRON HCL 4 MG/2ML IJ SOLN
4.0000 mg | Freq: Four times a day (QID) | INTRAMUSCULAR | Status: DC | PRN
  Administered 2012-02-11: 4 mg via INTRAVENOUS
  Filled 2012-02-11: qty 2

## 2012-02-11 MED ORDER — ALBUTEROL SULFATE (5 MG/ML) 0.5% IN NEBU: 2.5000 mg | INHALATION_SOLUTION | RESPIRATORY_TRACT | Status: DC | PRN

## 2012-02-11 MED ORDER — SODIUM CHLORIDE 0.9 % IV BOLUS (SEPSIS)
1000.0000 mL | Freq: Once | INTRAVENOUS | Status: AC
  Administered 2012-02-11: 1000 mL via INTRAVENOUS

## 2012-02-11 MED ORDER — MORPHINE SULFATE 4 MG/ML IJ SOLN
4.0000 mg | Freq: Once | INTRAMUSCULAR | Status: DC
  Filled 2012-02-11: qty 1

## 2012-02-11 MED ORDER — SODIUM CHLORIDE 0.9 % IJ SOLN
INTRAMUSCULAR | Status: AC
  Administered 2012-02-11: 3 mL
  Filled 2012-02-11: qty 3

## 2012-02-11 MED ORDER — HYDROMORPHONE HCL PF 1 MG/ML IJ SOLN
1.0000 mg | Freq: Once | INTRAMUSCULAR | Status: AC
  Administered 2012-02-11: 1 mg via INTRAVENOUS
  Filled 2012-02-11: qty 1

## 2012-02-11 MED ORDER — GI COCKTAIL ~~LOC~~
30.0000 mL | Freq: Once | ORAL | Status: AC
  Administered 2012-02-11: 30 mL via ORAL

## 2012-02-11 MED ORDER — SODIUM CHLORIDE 0.9 % IJ SOLN
INTRAMUSCULAR | Status: AC
  Administered 2012-02-11: 10 mL
  Filled 2012-02-11: qty 3

## 2012-02-11 MED ORDER — PANTOPRAZOLE SODIUM 40 MG IV SOLR
40.0000 mg | Freq: Once | INTRAVENOUS | Status: AC
  Administered 2012-02-11: 40 mg via INTRAVENOUS
  Filled 2012-02-11: qty 40

## 2012-02-11 MED ORDER — HYDROMORPHONE HCL PF 1 MG/ML IJ SOLN
1.0000 mg | INTRAMUSCULAR | Status: DC | PRN
  Administered 2012-02-11: 1 mg via INTRAVENOUS
  Filled 2012-02-11: qty 1

## 2012-02-11 MED ORDER — GI COCKTAIL ~~LOC~~
ORAL | Status: AC
  Administered 2012-02-11: 30 mL via ORAL
  Filled 2012-02-11: qty 30

## 2012-02-11 MED ORDER — ONDANSETRON HCL 4 MG PO TABS: 4.0000 mg | ORAL_TABLET | Freq: Four times a day (QID) | ORAL | Status: DC | PRN

## 2012-02-11 MED ORDER — ACETAMINOPHEN 650 MG RE SUPP: 650.0000 mg | Freq: Four times a day (QID) | RECTAL | Status: DC | PRN

## 2012-02-11 MED ORDER — PROMETHAZINE HCL 25 MG/ML IJ SOLN: 12.5000 mg | INTRAMUSCULAR | Status: DC | PRN

## 2012-02-11 MED ORDER — LORAZEPAM 2 MG/ML IJ SOLN
1.0000 mg | Freq: Once | INTRAMUSCULAR | Status: AC
  Administered 2012-02-11: 1 mg via INTRAVENOUS
  Filled 2012-02-11: qty 1

## 2012-02-11 MED ORDER — PANTOPRAZOLE SODIUM 40 MG IV SOLR
40.0000 mg | Freq: Two times a day (BID) | INTRAVENOUS | Status: DC
  Administered 2012-02-11 – 2012-02-12 (×2): 40 mg via INTRAVENOUS
  Filled 2012-02-11 (×2): qty 40

## 2012-02-11 MED ORDER — POTASSIUM CHLORIDE IN NACL 20-0.9 MEQ/L-% IV SOLN
INTRAVENOUS | Status: DC
  Administered 2012-02-11: 17:00:00 via INTRAVENOUS

## 2012-02-11 MED ORDER — ACETAMINOPHEN 325 MG PO TABS
650.0000 mg | ORAL_TABLET | Freq: Four times a day (QID) | ORAL | Status: DC | PRN
  Filled 2012-02-11: qty 2

## 2012-02-11 NOTE — ED Notes (Signed)
Pt reports pain is now between 1 and 2. Nausea has settled. Pt sitting up in bed NAD noted. Family at bedside. Pt being transferred to room 308.

## 2012-02-11 NOTE — H&P (Signed)
[facility] History and Physical  Bryan Gilbert RUE:[id number] DOB: April 14, 1966 DOA: 02/11/2012  Referring physician: Donnetta Hutching, M.D. PCP: Lilyan Punt, MD  GI: Samule Ohm, MD ([facility])  Chief Complaint: Abdominal pain and intractable nausea and vomiting.  HPI: The patient is a 46 year old man with a long-standing history of cyclic vomiting syndrome, who presents to the emergency department with a chief complaint of epigastric abdominal pain and intractable nausea and vomiting. He had actually presented to the emergency department on 02/08/2012 and 02/09/2012 for treatment. He was given antiemetics and IV hydromorphone. These measures temporarily relieved his symptoms. However, his pain and associated cyclic vomiting have become unrelenting. His abdominal pain is epigastric. It is 10 over 10 in intensity. He describes it as cramping. No radiation. His symptoms actually started approximately one week ago. It generally starts with diarrhea. It progresses to episodic and intermittent yet persistent stomach cramping and subsequent cyclic nausea and vomiting. Oral or IV Dilaudid helps. Hot baths help. He denies coffee grounds emesis or bright red blood in his emesis. He denies black tarry stools or bright red blood in his stools. He has not had diarrhea today.   In the emergency department, he is hemodynamically stable and afebrile. His lab data are significant for white blood cell count of 11.8, hemoglobin of 18.0, and a venous glucose of 171. His lipase and liver transaminases are within normal limits. He is being admitted for further evaluation and management.   Review of Systems:  As above in history present illness, otherwise negative.  Past Medical History  Diagnosis Date  . Cyclical vomiting   . Hypertension   . Slipped intervertebral disc   . Sleep apnea     states uses O2 and CPAP at night   History reviewed. No pertinent past surgical history. Social History: He is married. He has  3 children. He lives in [place]. 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 He denies tobacco, alcohol, and illicit drug use.  No Known Allergies  Family History  Problem Relation Age of Onset  . Heart failure Father   . Hypertension Father   His mother has some type of neurological disorder.  Prior to Admission medications   Medication Sig Start Date End Date Taking? Authorizing Provider  cyclobenzaprine (FLEXERIL) 10 MG tablet Take 10 mg by mouth 3 (three) times daily as needed. For muscle relaxation    Yes Historical Provider, MD  HYDROcodone-acetaminophen (NORCO) 10-325 MG per tablet Take 1 tablet by mouth 4 (four) times daily as needed. For pain   Yes Historical Provider, MD  HYDROmorphone (DILAUDID) 2 MG tablet Take 2 mg by mouth daily as needed. For pain   Yes Historical Provider, MD  lisinopril (PRINIVIL,ZESTRIL) 20 MG tablet Take 20 mg by mouth every evening.    Yes Historical Provider, MD  pantoprazole (PROTONIX) 40 MG tablet Take 40 mg by mouth every evening. 07/13/11 07/12/12 Yes Burgess Amor, PA  pravastatin (PRAVACHOL) 20 MG tablet Take 20 mg by mouth every evening.    Yes Historical Provider, MD  Testosterone (FORTESTA) 10 MG/ACT (2%) GEL Place 2 application onto the skin daily. *Apply two pumps topically to lower extremity once daily*   Yes Historical Provider, MD   Physical Exam: Filed Vitals:   02/11/12 1104 02/11/12 1324 02/11/12 1359 02/11/12 1602  BP: 144/92 143/94 143/94 122/77  Pulse: 86 89 88 98  Temp: 99 F (37.2 C)   97.8 F (36.6 C)  TempSrc: Oral   Oral  Resp: 20 17 17  18  Height: 5\' 7"  (1.702 m)   5\' 7"  (1.702 m)  Weight: 97.523 kg (215 lb)   97.523 kg (215 lb)  SpO2: 98% 92% 97% 98%     General:  Alert 46 year old Caucasian man lying in bed, in no acute distress.  Eyes: Pupils equal, round, and reactive to light. Extraocular movements are intact. Conjunctivae are clear. Sclerae are white.  ENT: Oropharynx reveals mildly dry mucous membranes. No posterior  exudates or erythema.  Neck: Supple, no adenopathy, no thyromegaly, no JVD.  Cardiovascular: Clear to auscultation bilaterally.  Respiratory: Clear to auscultation bilaterally.  Abdomen: Positive bowel sounds, soft, mildly tender in the epigastrium. No distention, no rigidity, no masses palpated. No hepatosplenomegaly.  Skin: Good turgor, no rashes.  Musculoskeletal: Pedal pulses palpable. No pedal edema.  Psychiatric: Pleasant affect.  Speech is clear.  Neurologic: Alert and oriented x3. Cranial nerves II through XII are intact.  Labs on Admission:  Basic Metabolic Panel:  Lab 02/11/12 4098 02/08/12 1643  NA 137 139  K 3.8 4.1  CL 100 101  CO2 21 21  GLUCOSE 171* 177*  BUN 16 23  CREATININE 1.02 1.02  CALCIUM 10.4 10.7*  MG -- --  PHOS -- --   Liver Function Tests:  Lab 02/11/12 1136 02/08/12 1643  AST 23 22  ALT 43 45  ALKPHOS 65 67  BILITOT 1.3* 1.2  PROT 8.0 8.2  ALBUMIN 4.4 4.5    Lab 02/11/12 1136 02/08/12 1643  LIPASE 21 21  AMYLASE -- --   No results found for this basename: AMMONIA:5 in the last 168 hours CBC:  Lab 02/11/12 1136 02/08/12 1643  WBC 11.8* 16.0*  NEUTROABS 9.4* 13.4*  HGB 18.0* 18.6*  HCT 50.1 51.0  MCV 84.6 84.3  PLT 296 315   Cardiac Enzymes: No results found for this basename: CKTOTAL:5,CKMB:5,CKMBINDEX:5,TROPONINI:5 in the last 168 hours  BNP (last 3 results) No results found for this basename: PROBNP:3 in the last 8760 hours CBG: No results found for this basename: GLUCAP:5 in the last 168 hours  Radiological Exams on Admission: No results found.  EKG:   Assessment/Plan Principal Problem:  *Cyclic vomiting syndrome Active Problems:  HTN (hypertension)  Hyperglycemia  Obstructive sleep apnea  Polycythemia, secondary   37. 46 year old patient who presents with cyclic vomiting syndrome, not managed with outpatient treatment. He has hyperglycemia but denies a diagnosis of diabetes. He has mild leukocytosis which  is likely reactive. His white blood count cell count is actually lower than it was a couple days ago. A urinalysis has not been ordered but it will be. He has no pulmonary symptoms. He appears to be somewhat volume depleted. He has polycythemia, possibly secondary to dehydration versus chronic hypoxia from sleep apnea.    Plan:  1. The patient received multiple doses of IV Dilaudid in the emergency department. He is feeling somewhat better, but will admit him for 24-hour observation for further treatment. 2. We'll start as needed IV hydromorphone every 3 hours and as needed Phenergan or as needed Zofran for nausea. 3. Will start IV Protonix every 12 hours. 4. We'll start a full liquid diet as tolerated. 5. For further evaluation, we'll order TSH and hemoglobin A1c.  Code Status: Full Family Communication: Discussed with his wife Disposition Plan: Home when improved  Time spent: One hour  Stassi Fadely [facility] Pager: [phone]  If 7PM-7AM, please contact night-coverage [email or web address] Password [facility] 02/11/2012, 4:41 PM

## 2012-02-11 NOTE — ED Notes (Signed)
Pt ready for transfer. Report given to floor. VSS.

## 2012-02-11 NOTE — ED Provider Notes (Signed)
History   This chart was scribed for Donnetta Hutching, MD by Charolett Bumpers . The patient was seen in room APA05/APA05. Patient's care was started at 11:29.    CSN: [id number]  Arrival date & time 02/11/12  1050   First MD Initiated Contact with Patient 02/11/12 1129      Chief Complaint  Patient presents with  . Emesis    (Consider location/radiation/quality/duration/timing/severity/associated sxs/prior treatment) HPI Bryan Gilbert is a 46 y.o. male who presents to the Emergency Department complaining of intermittent, moderate emesis for the past week. Pt reports that he has a h/o cyclic vomiting. Pt states that he has been seen here twice in the past 3 days for the same complaint as well as by his PCP yesterday and told if he needed to be seen again that he should be admitted for observation. Pt states that he has tried eating in middle of night to try to prevent the episodes, with no relief. Pt states that he has had cyclic vomiting episodes for the past 12 years, but hasn't had a flare up or been in ED since January this year. Pt has had dilaudid PTA orally and in ED, and reports no relief.     Level V caveat for urgent need for intervention  PCP: Dr. Lilyan Punt GI: Dr. Florentina Jenny at [facility]  Past Medical History  Diagnosis Date  . Cyclical vomiting   . Hypertension   . Slipped intervertebral disc     No past surgical history on file.  Family History  Problem Relation Age of Onset  . Heart failure Father   . Hypertension Father     History  Substance Use Topics  . Smoking status: Never Smoker   . Smokeless tobacco: Not on file  . Alcohol Use: No      Review of Systems  Unable to perform ROS: Other   A complete 10 system review of systems was obtained and all systems are negative except as noted in the HPI and PMH.   Allergies  Review of patient's allergies indicates no known allergies.  Home Medications   Current Outpatient Rx  Name Route Sig Dispense  Refill  . CYCLOBENZAPRINE HCL 10 MG PO TABS Oral Take 10 mg by mouth 3 (three) times daily as needed. For muscle relaxation     . HYDROCODONE-ACETAMINOPHEN 10-325 MG PO TABS Oral Take 1 tablet by mouth 4 (four) times daily as needed. For pain    . HYDROMORPHONE HCL 2 MG PO TABS Oral Take 2 mg by mouth daily as needed. For pain    . LISINOPRIL 20 MG PO TABS Oral Take 20 mg by mouth every evening.     Marland Kitchen PANTOPRAZOLE SODIUM 40 MG PO TBEC Oral Take 40 mg by mouth every evening.    Marland Kitchen PRAVASTATIN SODIUM 20 MG PO TABS Oral Take 20 mg by mouth every evening.     . TESTOSTERONE 10 MG/ACT (2%) TD GEL Transdermal Place 2 application onto the skin daily. *Apply two pumps topically to lower extremity once daily*      BP 144/92  Pulse 86  Temp 99 F (37.2 C) (Oral)  Resp 20  Ht 5\' 7"  (1.702 m)  Wt 215 lb (97.523 kg)  BMI 33.67 kg/m2  SpO2 98%  Physical Exam  Nursing note and vitals reviewed. Constitutional: He is oriented to person, place, and time. He appears well-developed and well-nourished. No distress.  HENT:  Head: Normocephalic and atraumatic.  Eyes: EOM are normal. Pupils  are equal, round, and reactive to light.  Neck: Neck supple. No tracheal deviation present.  Cardiovascular: Normal rate.   Pulmonary/Chest: Effort normal. No respiratory distress.  Abdominal: Soft. He exhibits no distension. There is tenderness.       Epigastric tenderness.   Musculoskeletal: Normal range of motion. He exhibits no edema.  Neurological: He is alert and oriented to person, place, and time. No sensory deficit.  Skin: Skin is warm and dry. There is pallor.  Psychiatric: He has a normal mood and affect. His behavior is normal.    ED Course  Procedures (including critical care time)  DIAGNOSTIC STUDIES: Oxygen Saturation is 98% on room air, normal by my interpretation.    COORDINATION OF CARE:  11:30-Medication Orders: Pantoprazole (Protonix) injection 40 mg-once; Ondansetron (Zofran) injection 4  mg-once  11:45-Medication Orders: Sodium chloride 0.9% bolus 1,000 mL-once; Sodium chloride 0.9% bolus 1,000 mL-once  12:00-Medication Orders: Hydromorphone (Dilaudid) injection 1 mg-once  12:15: Medication Orders: GI cocktail suspension; GI cocktail (Maalox, Lidocaine, Donnatal)-once  12:23-Discussed planned course of treatment with the patient including IV fluids and pain medication, who is agreeable at this time. Discussed possible admission.   12:45-Medication Orders: Hydromorphone (Dilaudid) injection 1 mg-once  Results for orders placed during the hospital encounter of 02/11/12  CBC WITH DIFFERENTIAL      Component Value Range   WBC 11.8 (*) 4.0 - 10.5 K/uL   RBC 5.92 (*) 4.22 - 5.81 MIL/uL   Hemoglobin 18.0 (*) 13.0 - 17.0 g/dL   HCT 04.5  40.9 - 81.1 %   MCV 84.6  78.0 - 100.0 fL   MCH 30.4  26.0 - 34.0 pg   MCHC 35.9  30.0 - 36.0 g/dL   RDW 91.4  78.2 - 95.6 %   Platelets 296  150 - 400 K/uL   Neutrophils Relative 80 (*) 43 - 77 %   Neutro Abs 9.4 (*) 1.7 - 7.7 K/uL   Lymphocytes Relative 13  12 - 46 %   Lymphs Abs 1.6  0.7 - 4.0 K/uL   Monocytes Relative 6  3 - 12 %   Monocytes Absolute 0.8  0.1 - 1.0 K/uL   Eosinophils Relative 0  0 - 5 %   Eosinophils Absolute 0.0  0.0 - 0.7 K/uL   Basophils Relative 0  0 - 1 %   Basophils Absolute 0.0  0.0 - 0.1 K/uL  COMPREHENSIVE METABOLIC PANEL      Component Value Range   Sodium 137  135 - 145 mEq/L   Potassium 3.8  3.5 - 5.1 mEq/L   Chloride 100  96 - 112 mEq/L   CO2 21  19 - 32 mEq/L   Glucose, Bld 171 (*) 70 - 99 mg/dL   BUN 16  6 - 23 mg/dL   Creatinine, Ser 2.13  0.50 - 1.35 mg/dL   Calcium 08.6  8.4 - 57.8 mg/dL   Total Protein 8.0  6.0 - 8.3 g/dL   Albumin 4.4  3.5 - 5.2 g/dL   AST 23  0 - 37 U/L   ALT 43  0 - 53 U/L   Alkaline Phosphatase 65  39 - 117 U/L   Total Bilirubin 1.3 (*) 0.3 - 1.2 mg/dL   GFR calc non Af Amer 87 (*) >90 mL/min   GFR calc Af Amer >90  >90 mL/min  LIPASE, BLOOD      Component Value  Range   Lipase 21  11 - 59 U/L  No results found.   No diagnosis found.    MDM  This is patient's fourth visit to the doctor this week, including third ED visit. Will hydrate, treat pain and nausea, admit    I personally performed the services described in this documentation, which was scribed in my presence. The recorded information has been reviewed and considered.      Donnetta Hutching, MD 02/11/12 1334

## 2012-02-11 NOTE — ED Notes (Signed)
Pt on floor of waiting room and vomiting, wife states that he has been here for same x 2 this week and has cyclic vomiting

## 2012-02-12 DIAGNOSIS — D751 Secondary polycythemia: Secondary | ICD-10-CM

## 2012-02-12 DIAGNOSIS — R6889 Other general symptoms and signs: Secondary | ICD-10-CM

## 2012-02-12 DIAGNOSIS — R7989 Other specified abnormal findings of blood chemistry: Secondary | ICD-10-CM | POA: Diagnosis present

## 2012-02-12 LAB — COMPREHENSIVE METABOLIC PANEL
ALT: 33 U/L (ref 0–53)
AST: 17 U/L (ref 0–37)
Albumin: 3.5 g/dL (ref 3.5–5.2)
Alkaline Phosphatase: 50 U/L (ref 39–117)
BUN: 13 mg/dL (ref 6–23)
CO2: 30 mEq/L (ref 19–32)
Calcium: 9.4 mg/dL (ref 8.4–10.5)
Chloride: 104 mEq/L (ref 96–112)
Creatinine, Ser: 1.09 mg/dL (ref 0.50–1.35)
GFR calc Af Amer: >90 mL/min (ref >90)
GFR calc non Af Amer: 80 mL/min — ABNORMAL LOW (ref >90)
Glucose, Bld: 96 mg/dL (ref 70–99)
Potassium: 4.6 mEq/L (ref 3.5–5.1)
Sodium: 139 mEq/L (ref 135–145)
Total Bilirubin: 1.7 mg/dL — ABNORMAL HIGH (ref 0.3–1.2)
Total Protein: 6.5 g/dL (ref 6.0–8.3)

## 2012-02-12 LAB — CBC
HCT: 46.5 % (ref 39.0–52.0)
Hemoglobin: 16 g/dL (ref 13.0–17.0)
MCH: 30 pg (ref 26.0–34.0)
MCHC: 34.4 g/dL (ref 30.0–36.0)
MCV: 87.1 fL (ref 78.0–100.0)
Platelets: 300 10*3/uL (ref 150–400)
RBC: 5.34 MIL/uL (ref 4.22–5.81)
RDW: 12.5 % (ref 11.5–15.5)
WBC: 10.8 10*3/uL — ABNORMAL HIGH (ref 4.0–10.5)

## 2012-02-12 LAB — T4, FREE: Free T4: 1.71 ng/dL (ref 0.80–1.80)

## 2012-02-12 LAB — HEMOGLOBIN A1C
Hgb A1c MFr Bld: 5.8 % — ABNORMAL HIGH (ref <5.7)
Mean Plasma Glucose: 120 mg/dL — ABNORMAL HIGH (ref <117)

## 2012-02-12 MED ORDER — SODIUM CHLORIDE 0.9 % IJ SOLN
INTRAMUSCULAR | Status: AC
  Administered 2012-02-12: 10 mL
  Filled 2012-02-12: qty 3

## 2012-02-12 NOTE — Progress Notes (Signed)
Patient given discharge instructions. No new questions. Will f/u with Dr. Lilyan Punt as needed. Patient waiting on ride.

## 2012-02-12 NOTE — Progress Notes (Signed)
Patient left with wife. Taken out of facility by staff via wheelchair.

## 2012-02-12 NOTE — Progress Notes (Signed)
UR chart review completed.  

## 2012-02-12 NOTE — Discharge Summary (Signed)
Physician Discharge Summary  Carvell Hoeffner Stenglein JYN:[id number] DOB: 05-13-1966 DOA: 02/11/2012  PCP: Lilyan Punt, MD  Admit date: 02/11/2012 Discharge date: 02/12/2012  Recommendations for Outpatient Follow-up:  1. The patient was discharged to home. He will followup with Dr. Gerda Diss as needed.  Discharge Diagnoses:  1. Cyclic vomiting syndrome. 2. Secondary polycythemia secondary to dehydration. Resolved. 3. Mild hyperglycemia. Hemoglobin A1c 5.8. 4. Slightly low TSH of 0.33. Free T4 pending at the time of discharge. 5. Hypertension. Well controlled. 6. Gastroesophageal reflux disease. Stable. 7. Leukocytosis, likely reactive. 8. Chronic obstructive sleep apnea, on CPAP.  Discharge Condition: improved.   Diet recommendation: as tolerated.   Wt Readings from Last 3 Encounters:  02/12/12 99.565 kg (219 lb 8 oz)  02/09/12 97.523 kg (215 lb)  02/08/12 99.791 kg (220 lb)    History of present illness:  The patient is a 46 year old man with a long-standing history of cyclic vomiting syndrome who presented to the emergency department on 02/11/2012 with a chief complaint of epigastric abdominal pain and intractable nausea and vomiting. In the emergency department, he was hemostatically stable and afebrile. His lab data were significant for a white blood cell count of 11.8, hemoglobin of 18.0, and venous glucose 171. His lipase and liver transaminases were within normal limits. He was admitted for further evaluation and management.   Hospital Course:  The patient was started on IV fluid hydration with IV fluids. Symptomatic treatment was started with as needed IV hydromorphone and as needed IV Zofran and Phenergan. Protonix was given IV every 12 hours. For evaluation, a number of studies were ordered. His hemoglobin A1c was 5.8, relatively normal. His venous glucose normalized to 96. His TSH was slightly low at 0.338. A free T4 was ordered but the results were pending at the time of discharge. A rinalysis  was ordered but was not ever collected. His white blood cell count improved to 10.8 with supportive treatment only. His hemoglobin improved from 18 to16. Over the course of the hospitalization, the patient's abdominal pain, nausea, and vomiting resolved. He was discharged home on his usual regimen of analgesics and antiemetics. He was informed that I would call him if his free T4 was abnormal.   Procedures:  None  Consultations:  None   Discharge Exam: Filed Vitals:   02/12/12 0531  BP: 116/71  Pulse: 83  Temp: 98.5 F (36.9 C)  Resp: 16   Filed Vitals:   02/11/12 1359 02/11/12 1602 02/11/12 2104 02/12/12 0531  BP: 143/94 122/77 124/77 116/71  Pulse: 88 98 99 83  Temp:  97.8 F (36.6 C) 99.5 F (37.5 C) 98.5 F (36.9 C)  TempSrc:  Oral    Resp: 17 18 16 16   Height:  5\' 7"  (1.702 m)    Weight:  97.523 kg (215 lb)  99.565 kg (219 lb 8 oz)  SpO2: 97% 98% 98% 97%    General: pleasant 46 year old Caucasian man sitting up in bed, in no acute distress. Cardiovascular: S1, S2, with no murmurs rubs or gallops.  Respiratory: Clear to auscultation bilaterally. Abdomen: Mildly obese, positive bowel sounds, soft, nontender, nondistended.   Discharge Instructions  Discharge Orders    Future Orders Please Complete By Expires   Diet - low sodium heart healthy      Increase activity slowly        Medication List  As of 02/12/2012  9:36 AM   TAKE these medications         cyclobenzaprine 10 MG tablet  Commonly known as: FLEXERIL   Take 10 mg by mouth 3 (three) times daily as needed. For muscle relaxation      FORTESTA 10 MG/ACT (2%) Gel   Generic drug: Testosterone   Place 2 application onto the skin daily. *Apply two pumps topically to lower extremity once daily*      HYDROcodone-acetaminophen 10-325 MG per tablet   Commonly known as: NORCO   Take 1 tablet by mouth 4 (four) times daily as needed. For pain      HYDROmorphone 2 MG tablet   Commonly known as: DILAUDID    Take 2 mg by mouth daily as needed. For pain      lisinopril 20 MG tablet   Commonly known as: PRINIVIL,ZESTRIL   Take 20 mg by mouth every evening.      pantoprazole 40 MG tablet   Commonly known as: PROTONIX   Take 40 mg by mouth every evening.      pravastatin 20 MG tablet   Commonly known as: PRAVACHOL   Take 20 mg by mouth every evening.              The results of significant diagnostics from this hospitalization (including imaging, microbiology, ancillary and laboratory) are listed below for reference.    Significant Diagnostic Studies: No results found.  Microbiology: No results found for this or any previous visit (from the past 240 hour(s)).   Labs: Basic Metabolic Panel:  Lab 02/12/12 1478 02/11/12 1136 02/08/12 1643  NA 139 137 139  K 4.6 3.8 4.1  CL 104 100 101  CO2 30 21 21   GLUCOSE 96 171* 177*  BUN 13 16 23   CREATININE 1.09 1.02 1.02  CALCIUM 9.4 10.4 10.7*  MG -- -- --  PHOS -- -- --   Liver Function Tests:  Lab 02/12/12 0507 02/11/12 1136 02/08/12 1643  AST 17 23 22   ALT 33 43 45  ALKPHOS 50 65 67  BILITOT 1.7* 1.3* 1.2  PROT 6.5 8.0 8.2  ALBUMIN 3.5 4.4 4.5    Lab 02/11/12 1136 02/08/12 1643  LIPASE 21 21  AMYLASE -- --   No results found for this basename: AMMONIA:5 in the last 168 hours CBC:  Lab 02/12/12 0507 02/11/12 1136 02/08/12 1643  WBC 10.8* 11.8* 16.0*  NEUTROABS -- 9.4* 13.4*  HGB 16.0 18.0* 18.6*  HCT 46.5 50.1 51.0  MCV 87.1 84.6 84.3  PLT 300 296 315   Cardiac Enzymes: No results found for this basename: CKTOTAL:5,CKMB:5,CKMBINDEX:5,TROPONINI:5 in the last 168 hours BNP: BNP (last 3 results) No results found for this basename: PROBNP:3 in the last 8760 hours CBG: No results found for this basename: GLUCAP:5 in the last 168 hours  Time coordinating discharge: less than 35  minutes  Signed:  Destin Vinsant  [facility] 02/12/2012, 9:36 AM

## 2012-04-25 ENCOUNTER — Encounter (HOSPITAL_COMMUNITY): Payer: Self-pay | Admitting: *Deleted

## 2012-04-25 ENCOUNTER — Emergency Department (HOSPITAL_COMMUNITY)
Admission: EM | Admit: 2012-04-25 | Discharge: 2012-04-26 | Disposition: A | Discharge status: Home / Self Care | Payer: Managed Care, Other (non HMO) | Attending: Emergency Medicine | Admitting: Emergency Medicine

## 2012-04-25 DIAGNOSIS — I10 Essential (primary) hypertension: Secondary | ICD-10-CM | POA: Insufficient documentation

## 2012-04-25 DIAGNOSIS — Z79899 Other long term (current) drug therapy: Secondary | ICD-10-CM | POA: Insufficient documentation

## 2012-04-25 DIAGNOSIS — R1115 Cyclical vomiting syndrome unrelated to migraine: Secondary | ICD-10-CM

## 2012-04-25 DIAGNOSIS — G4730 Sleep apnea, unspecified: Secondary | ICD-10-CM | POA: Insufficient documentation

## 2012-04-25 DIAGNOSIS — IMO0002 Reserved for concepts with insufficient information to code with codable children: Secondary | ICD-10-CM | POA: Insufficient documentation

## 2012-04-25 LAB — CBC WITH DIFFERENTIAL/PLATELET
Basophils Absolute: 0 10*3/uL (ref 0.0–0.1)
Basophils Relative: 0 % (ref 0–1)
Eosinophils Absolute: 0 10*3/uL (ref 0.0–0.7)
Eosinophils Relative: 0 % (ref 0–5)
HCT: 46.6 % (ref 39.0–52.0)
Hemoglobin: 16.5 g/dL (ref 13.0–17.0)
Lymphocytes Relative: 7 % — ABNORMAL LOW (ref 12–46)
Lymphs Abs: 0.9 10*3/uL (ref 0.7–4.0)
MCH: 30.1 pg (ref 26.0–34.0)
MCHC: 35.4 g/dL (ref 30.0–36.0)
MCV: 84.9 fL (ref 78.0–100.0)
Monocytes Absolute: 0.5 10*3/uL (ref 0.1–1.0)
Monocytes Relative: 4 % (ref 3–12)
Neutro Abs: 11 10*3/uL — ABNORMAL HIGH (ref 1.7–7.7)
Neutrophils Relative %: 89 % — ABNORMAL HIGH (ref 43–77)
Platelets: 308 10*3/uL (ref 150–400)
RBC: 5.49 MIL/uL (ref 4.22–5.81)
RDW: 12 % (ref 11.5–15.5)
WBC: 12.4 10*3/uL — ABNORMAL HIGH (ref 4.0–10.5)

## 2012-04-25 MED ORDER — HYDROMORPHONE HCL PF 2 MG/ML IJ SOLN
2.0000 mg | Freq: Once | INTRAMUSCULAR | Status: AC
  Administered 2012-04-25: 2 mg via INTRAVENOUS
  Filled 2012-04-25: qty 1

## 2012-04-25 MED ORDER — ONDANSETRON HCL 4 MG/2ML IJ SOLN
4.0000 mg | Freq: Once | INTRAMUSCULAR | Status: AC
  Administered 2012-04-25: 4 mg via INTRAVENOUS
  Filled 2012-04-25: qty 2

## 2012-04-25 MED ORDER — SODIUM CHLORIDE 0.9 % IV SOLN
Freq: Once | INTRAVENOUS | Status: AC
  Administered 2012-04-25: via INTRAVENOUS

## 2012-04-25 NOTE — ED Notes (Addendum)
Cyclic vomiting , onset 1 pm.  Went to [facility] and waited over 5 hours.  Unable to hold thermometer in mouth due to nausea at present.

## 2012-04-25 NOTE — ED Notes (Signed)
Patient and wife attempt to direct how medications are given and the rate at which they are given.

## 2012-04-25 NOTE — ED Notes (Addendum)
States he has "cyclic vomiting syndrome"  States he has had abdominal pain and vomiting since 1 pm today.  Friend states that they drove to [facility] to be seen with a 5hr wait, however decided to come to [facility] to be seen.  States that Dilaudid 2mg  is what they usually give him to make him stop vomiting.  States he has had cyclic vomiting syndrome for over 12 years.  No active vomiting at present.  The patient appears very agitated at present.

## 2012-04-25 NOTE — ED Notes (Signed)
Wife asks, Where is his GI cocktail, he needs one."  MD made aware of statement.

## 2012-04-25 NOTE — ED Provider Notes (Signed)
History     CSN: [id number]  Arrival date & time 04/25/12  2211   First MD Initiated Contact with Patient 04/25/12 2255      Chief Complaint  Patient presents with  . Emesis    (Consider location/radiation/quality/duration/timing/severity/associated sxs/prior treatment) Patient is a 46 y.o. male presenting with vomiting. The history is provided by the patient. No language interpreter was used.  Emesis  This is a new problem. The current episode started yesterday. The problem occurs continuously. The problem has been gradually worsening. The emesis has an appearance of stomach contents. There has been no fever. Associated symptoms include abdominal pain. Pertinent negatives include no diarrhea.   Pt's family member reports pt has cyclical vomiting syndrome.   She reports pt responds only to IV dilaudid.  Pt has a letter from Dr. Gerda Diss requesting ED give pt dilaudid.  Pt went to [facility] to be seen but reports the wait was to long so he came back here. Pt's wife reports pt has been seen here multiple times.  These are pt's chronic symptoms. Past Medical History  Diagnosis Date  . Cyclical vomiting   . Hypertension   . Slipped intervertebral disc   . Sleep apnea     states uses O2 and CPAP at night    History reviewed. No pertinent past surgical history.  Family History  Problem Relation Age of Onset  . Heart failure Father   . Hypertension Father     History  Substance Use Topics  . Smoking status: Never Smoker   . Smokeless tobacco: Not on file  . Alcohol Use: No      Review of Systems  Gastrointestinal: Positive for vomiting and abdominal pain. Negative for diarrhea.  All other systems reviewed and are negative.    Allergies  Review of patient's allergies indicates no known allergies.  Home Medications   Current Outpatient Rx  Name Route Sig Dispense Refill  . CYCLOBENZAPRINE HCL 10 MG PO TABS Oral Take 10 mg by mouth 3 (three) times daily as needed. For  muscle relaxation     . HYDROCODONE-ACETAMINOPHEN 10-325 MG PO TABS Oral Take 1 tablet by mouth 4 (four) times daily as needed. For pain    . HYDROMORPHONE HCL 2 MG PO TABS Oral Take 2 mg by mouth daily as needed. For pain    . LISINOPRIL 20 MG PO TABS Oral Take 20 mg by mouth every evening.     Marland Kitchen PANTOPRAZOLE SODIUM 40 MG PO TBEC Oral Take 40 mg by mouth every evening.    Marland Kitchen PRAVASTATIN SODIUM 20 MG PO TABS Oral Take 20 mg by mouth every evening.     . TESTOSTERONE 10 MG/ACT (2%) TD GEL Transdermal Place 2 application onto the skin daily. *Apply two pumps topically to lower extremity once daily*      BP 151/96  Pulse 111  Resp 18  Ht 5\' 7"  (1.702 m)  Wt 210 lb (95.255 kg)  BMI 32.89 kg/m2  SpO2 97%  Physical Exam  Nursing note and vitals reviewed. Constitutional: He is oriented to person, place, and time. He appears well-developed and well-nourished.  HENT:  Head: Normocephalic.  Right Ear: External ear normal.  Left Ear: External ear normal.  Eyes: Pupils are equal, round, and reactive to light.  Neck: Normal range of motion. Neck supple.  Cardiovascular: Normal rate.   Pulmonary/Chest: Effort normal.  Abdominal: Soft. Bowel sounds are normal. He exhibits no distension.  Musculoskeletal: Normal range of motion.  Neurological: He  is alert and oriented to person, place, and time.  Skin: Skin is warm.  Psychiatric: He has a normal mood and affect.    ED Course  Procedures (including critical care time)  Labs Reviewed - No data to display No results found.   1. Cyclical vomiting       MDM  Pt given IV fluids x 1 liter, dilaudid 2mg .  Pt given 2nd dosage.   Pt request Gi cocktail.  Labs returned and pt has no significant abnormality. Pt says pain is a one.  Pt has not vomitted while in ED.  Pt wants request 3rd dosage.    I advised pt to follow up with Dr. Gerda Diss.          Lonia Skinner [place], [place] 04/26/12 0019  Elson Areas, PA 04/26/12 0041  Lonia Skinner [place],  [place] 04/26/12 [phone]

## 2012-04-25 NOTE — ED Notes (Signed)
Requests pain medication. 

## 2012-04-25 NOTE — ED Notes (Signed)
Friend to nurses station requesting pain medications for patient.  Patient and friend advised that the patient needed MD evaluation prior to medication administration.

## 2012-04-26 LAB — COMPREHENSIVE METABOLIC PANEL
ALT: 67 U/L — ABNORMAL HIGH (ref 0–53)
AST: 24 U/L (ref 0–37)
Albumin: 4.4 g/dL (ref 3.5–5.2)
Alkaline Phosphatase: 73 U/L (ref 39–117)
BUN: 27 mg/dL — ABNORMAL HIGH (ref 6–23)
CO2: 22 mEq/L (ref 19–32)
Calcium: 10.1 mg/dL (ref 8.4–10.5)
Chloride: 100 mEq/L (ref 96–112)
Creatinine, Ser: 0.93 mg/dL (ref 0.50–1.35)
GFR calc Af Amer: >90 mL/min (ref >90)
GFR calc non Af Amer: >90 mL/min (ref >90)
Glucose, Bld: 194 mg/dL — ABNORMAL HIGH (ref 70–99)
Potassium: 3.8 mEq/L (ref 3.5–5.1)
Sodium: 137 mEq/L (ref 135–145)
Total Bilirubin: 0.8 mg/dL (ref 0.3–1.2)
Total Protein: 8.1 g/dL (ref 6.0–8.3)

## 2012-04-26 MED ORDER — GI COCKTAIL ~~LOC~~
ORAL | Status: AC
  Administered 2012-04-26: 30 mL
  Filled 2012-04-26: qty 30

## 2012-04-26 MED ORDER — HYDROMORPHONE HCL PF 2 MG/ML IJ SOLN
2.0000 mg | Freq: Once | INTRAMUSCULAR | Status: AC
  Administered 2012-04-26: 2 mg via INTRAVENOUS
  Filled 2012-04-26: qty 1

## 2012-04-26 NOTE — ED Notes (Signed)
Continues to complain of abdominal pain and wife to nurses station requesting liquids and crackers for the patient.

## 2012-04-26 NOTE — ED Notes (Addendum)
While discharging patient from ED, he states since he can't get anymore medication here, he will take some of his own.  Pt reaches into his bag and states that he is taking 2mg  of Dilaudid.  MD made aware of the patient's actions.  No active vomiting noted.  Pt is smiling and rates his pain 1-2/10.

## 2012-04-26 NOTE — ED Provider Notes (Signed)
Medical screening examination/treatment/procedure(s) were performed by non-physician practitioner and as supervising physician I was immediately available for consultation/collaboration.    Vida Roller, MD 04/26/12 [phone]

## 2012-04-26 NOTE — ED Notes (Signed)
Requests additional pain medication 

## 2012-09-14 ENCOUNTER — Encounter: Payer: Self-pay | Admitting: *Deleted

## 2012-09-17 ENCOUNTER — Encounter: Payer: Self-pay | Admitting: *Deleted

## 2012-09-20 ENCOUNTER — Encounter: Payer: Self-pay | Admitting: Family Medicine

## 2012-09-20 ENCOUNTER — Ambulatory Visit (INDEPENDENT_AMBULATORY_CARE_PROVIDER_SITE_OTHER): Payer: 59 | Admitting: Family Medicine

## 2012-09-20 VITALS — BP 118/68 | HR 80 | Wt 224.2 lb

## 2012-09-20 DIAGNOSIS — I10 Essential (primary) hypertension: Secondary | ICD-10-CM

## 2012-09-20 DIAGNOSIS — M549 Dorsalgia, unspecified: Secondary | ICD-10-CM

## 2012-09-20 DIAGNOSIS — G8929 Other chronic pain: Secondary | ICD-10-CM

## 2012-09-20 DIAGNOSIS — E785 Hyperlipidemia, unspecified: Secondary | ICD-10-CM

## 2012-09-20 MED ORDER — HYDROCODONE-ACETAMINOPHEN 10-325 MG PO TABS: 1.0000 | ORAL_TABLET | ORAL | Status: DC | PRN

## 2012-09-20 NOTE — Progress Notes (Signed)
Subjective:    Patient ID: Bryan Gilbert, male    DOB: April 14, 1966, 47 y.o.   MRN: [id number]  Abdominal Pain This is a chronic problem. The current episode started more than 1 year ago. The onset quality is gradual. The problem occurs intermittently. The most recent episode lasted 8 hours. The problem has been gradually improving. The pain is located in the generalized abdominal region and epigastric region. The pain is at a severity of 7/10. The pain is moderate. The quality of the pain is colicky and cramping. The abdominal pain does not radiate. Associated symptoms include anorexia, nausea and vomiting. Pertinent negatives include no fever, hematuria or weight loss. The pain is aggravated by certain positions. The pain is relieved by being still and liquids. He has tried proton pump inhibitors for the symptoms. The treatment provided mild relief. Prior diagnostic workup includes CT scan and GI consult.  Back Pain This is a chronic problem. The current episode started more than 1 year ago. The problem occurs daily. The problem has been gradually improving since onset. The pain is present in the thoracic spine. The quality of the pain is described as burning. Radiates to: radiates down l arm. The pain is at a severity of 8/10. The pain is moderate. The pain is worse during the night. The symptoms are aggravated by bending. Stiffness is present in the morning. Associated symptoms include abdominal pain, numbness (l arm), paresthesias (left arm) and weakness (slight l arm). Pertinent negatives include no bladder incontinence, bowel incontinence, fever, leg pain or weight loss. He has tried analgesics, bed rest and home exercises for the symptoms. The treatment provided moderate relief.      Review of Systems  Constitutional: Negative for fever, weight loss, activity change and appetite change.  HENT: Positive for neck stiffness (due to neck pain). Negative for facial swelling and sinus pressure.    Respiratory: Negative for choking, chest tightness and shortness of breath.   Gastrointestinal: Positive for nausea, vomiting, abdominal pain and anorexia. Negative for abdominal distention, anal bleeding and bowel incontinence.  Endocrine: Negative for cold intolerance.  Genitourinary: Negative for bladder incontinence, hematuria and enuresis.  Musculoskeletal: Positive for back pain.  Neurological: Positive for weakness (slight l arm), numbness (l arm) and paresthesias (left arm).   It should be noted that this patient does have a history of cyclical vomiting syndrome and has been in the ER multiple times over the past few years. Often having to be on IV medications to get the episode to go away. Should be noted that nortriptyline has helped him greatly with this.  Patient also has a history of herniated disc in his neck. He was seen at [facility] for this. They have been doing injections and this seems to be helping.  Patient also has a history of hypogonadism and is on medications for this through a specialist. Finally he also has a personal history hypertension sleep apnea for which she is being treated socially he does not smoke drinks infrequently he is separated from his wife but they're still living in the same household family history hypertension    Objective:   Physical Exam  Constitutional: He appears well-developed.  HENT:  Head: Normocephalic.  Neck: Neck supple. No tracheal deviation present.  Cardiovascular: Normal rate and normal heart sounds.   No murmur heard. Pulmonary/Chest: Effort normal. No respiratory distress. He has no wheezes.  Abdominal: Soft. He exhibits no distension. There is no tenderness.  Musculoskeletal:  Cervical back: He exhibits pain.  Lymphadenopathy:    He has no cervical adenopathy.          Assessment & Plan:  Chronic back pain - Plan: HYDROcodone-acetaminophen (NORCO) 10-325 MG per tablet  Other and unspecified  hyperlipidemia - Plan: Hepatic function panel, Lipid Profile  Unspecified essential hypertension - Plan: Basic Metabolic Panel (BMET) patient talked to at length, to watch diet,exercise,reduce weight. Check labs before follow up.  Cyclical vomiting syndrome-the nortriptyline seems to be doing very well for him he will continue this. I like to see the patient back in approximately 4 months.  As for the chronic pain he knows not to abuse medication use no greater than 5 per day and on days where it's not hurting him his bag use less.  His specialist at [facility] is Dr. Valere Dross follows his hypogonadism. Telephone [phone].

## 2012-09-20 NOTE — Patient Instructions (Signed)
Do labs before follow up in August

## 2012-10-03 ENCOUNTER — Other Ambulatory Visit: Payer: Self-pay | Admitting: *Deleted

## 2012-10-03 MED ORDER — ONDANSETRON HCL 8 MG PO TABS: 8.0000 mg | ORAL_TABLET | Freq: Three times a day (TID) | ORAL | Status: DC | PRN

## 2012-10-25 ENCOUNTER — Other Ambulatory Visit: Payer: Self-pay | Admitting: Family Medicine

## 2012-10-25 NOTE — Telephone Encounter (Signed)
Ok times 2  

## 2012-10-26 NOTE — Telephone Encounter (Signed)
Ok times 2  

## 2012-11-01 ENCOUNTER — Telehealth: Payer: Self-pay | Admitting: *Deleted

## 2012-11-01 ENCOUNTER — Encounter (HOSPITAL_COMMUNITY): Payer: Self-pay | Admitting: *Deleted

## 2012-11-01 ENCOUNTER — Emergency Department (HOSPITAL_COMMUNITY)
Admission: EM | Admit: 2012-11-01 | Discharge: 2012-11-01 | Disposition: A | Discharge status: Home / Self Care | Payer: Managed Care, Other (non HMO) | Attending: Emergency Medicine | Admitting: Emergency Medicine

## 2012-11-01 DIAGNOSIS — I10 Essential (primary) hypertension: Secondary | ICD-10-CM | POA: Insufficient documentation

## 2012-11-01 DIAGNOSIS — Z862 Personal history of diseases of the blood and blood-forming organs and certain disorders involving the immune mechanism: Secondary | ICD-10-CM | POA: Insufficient documentation

## 2012-11-01 DIAGNOSIS — Z9981 Dependence on supplemental oxygen: Secondary | ICD-10-CM | POA: Insufficient documentation

## 2012-11-01 DIAGNOSIS — Z79899 Other long term (current) drug therapy: Secondary | ICD-10-CM | POA: Insufficient documentation

## 2012-11-01 DIAGNOSIS — Z7982 Long term (current) use of aspirin: Secondary | ICD-10-CM | POA: Insufficient documentation

## 2012-11-01 DIAGNOSIS — R1115 Cyclical vomiting syndrome unrelated to migraine: Secondary | ICD-10-CM | POA: Insufficient documentation

## 2012-11-01 DIAGNOSIS — Z8739 Personal history of other diseases of the musculoskeletal system and connective tissue: Secondary | ICD-10-CM | POA: Insufficient documentation

## 2012-11-01 DIAGNOSIS — R109 Unspecified abdominal pain: Secondary | ICD-10-CM | POA: Insufficient documentation

## 2012-11-01 DIAGNOSIS — G4730 Sleep apnea, unspecified: Secondary | ICD-10-CM | POA: Insufficient documentation

## 2012-11-01 LAB — BASIC METABOLIC PANEL
BUN: 24 mg/dL — ABNORMAL HIGH (ref 6–23)
CO2: 25 mEq/L (ref 19–32)
Calcium: 10 mg/dL (ref 8.4–10.5)
Chloride: 100 mEq/L (ref 96–112)
Creatinine, Ser: 1.11 mg/dL (ref 0.50–1.35)
GFR calc Af Amer: >90 mL/min (ref >90)
GFR calc non Af Amer: 78 mL/min — ABNORMAL LOW (ref >90)
Glucose, Bld: 155 mg/dL — ABNORMAL HIGH (ref 70–99)
Potassium: 3.6 mEq/L (ref 3.5–5.1)
Sodium: 140 mEq/L (ref 135–145)

## 2012-11-01 LAB — CBC WITH DIFFERENTIAL/PLATELET
Basophils Absolute: 0 10*3/uL (ref 0.0–0.1)
Basophils Relative: 0 % (ref 0–1)
Eosinophils Absolute: 0 10*3/uL (ref 0.0–0.7)
Eosinophils Relative: 0 % (ref 0–5)
HCT: 50.3 % (ref 39.0–52.0)
Hemoglobin: 18.2 g/dL — ABNORMAL HIGH (ref 13.0–17.0)
Lymphocytes Relative: 24 % (ref 12–46)
Lymphs Abs: 2.6 10*3/uL (ref 0.7–4.0)
MCH: 30.4 pg (ref 26.0–34.0)
MCHC: 36.2 g/dL — ABNORMAL HIGH (ref 30.0–36.0)
MCV: 84 fL (ref 78.0–100.0)
Monocytes Absolute: 0.8 10*3/uL (ref 0.1–1.0)
Monocytes Relative: 7 % (ref 3–12)
Neutro Abs: 7.6 10*3/uL (ref 1.7–7.7)
Neutrophils Relative %: 69 % (ref 43–77)
Platelets: 333 10*3/uL (ref 150–400)
RBC: 5.99 MIL/uL — ABNORMAL HIGH (ref 4.22–5.81)
RDW: 12.5 % (ref 11.5–15.5)
WBC: 11 10*3/uL — ABNORMAL HIGH (ref 4.0–10.5)

## 2012-11-01 MED ORDER — HYDROMORPHONE HCL PF 2 MG/ML IJ SOLN
2.0000 mg | Freq: Once | INTRAMUSCULAR | Status: AC
  Administered 2012-11-01: 2 mg via INTRAVENOUS
  Filled 2012-11-01: qty 1

## 2012-11-01 MED ORDER — HYDROMORPHONE HCL PF 2 MG/ML IJ SOLN
INTRAMUSCULAR | Status: AC
  Administered 2012-11-01: 2 mg via INTRAVENOUS
  Filled 2012-11-01: qty 1

## 2012-11-01 MED ORDER — HYDROMORPHONE HCL PF 2 MG/ML IJ SOLN: 2.0000 mg | Freq: Once | INTRAMUSCULAR | Status: AC

## 2012-11-01 MED ORDER — SODIUM CHLORIDE 0.9 % IV BOLUS (SEPSIS)
1000.0000 mL | Freq: Once | INTRAVENOUS | Status: AC
  Administered 2012-11-01: 1000 mL via INTRAVENOUS

## 2012-11-01 MED ORDER — HYDROMORPHONE HCL PF 2 MG/ML IJ SOLN: 2.0000 mg | Freq: Once | INTRAMUSCULAR | Status: DC

## 2012-11-01 NOTE — ED Provider Notes (Signed)
History  This chart was scribed for Bryan Lennert, MD by Shari Heritage, ED Scribe. The patient was seen in room APA06/APA06. Patient's care was started at 1117.   CSN: [id number]  Arrival date & time 11/01/12  1043   First MD Initiated Contact with Patient 11/01/12 1117      Chief Complaint  Patient presents with  . Emesis  . Abdominal Pain    Patient is a 47 y.o. male presenting with vomiting and abdominal pain.  Emesis Severity:  Severe Duration:  1 day Timing:  Constant Quality:  Stomach contents Feeding tolerance: intolerant. Progression:  Unchanged Chronicity:  Chronic Associated symptoms: abdominal pain   Associated symptoms: no diarrhea and no headaches   Abdominal Pain Pain location:  Epigastric and periumbilical Pain radiates to:  Does not radiate Pain severity:  Moderate Progression:  Waxing and waning Relieved by:  Vomiting Associated symptoms: vomiting   Associated symptoms: no chest pain, no cough, no diarrhea, no fatigue and no hematuria     HPI Comments:  Dayln Tugwell Gilbert is a 47 y.o. male with history of cyclic vomiting, hypertension, sleep apnea who presents to the Emergency Department complaining of nausea, vomiting and abdominal pain onset yesterday night. Patient has taken 2 Dilaudid today, but patient hasn't gotten relief because he vomited up the medicines. Wife states that patient does not receive relief with anti-emetics. Wife states that Dilaudid and sometimes hot showers are all that improve symptoms. She says that patient usually comes to the ED for 2 doses of IV Dilaudid when he is unable to tolerate medicines PO at home. Patient is followed by a GI specialist at [facility].    Past Medical History  Diagnosis Date  . Cyclical vomiting   . Hypertension   . Slipped intervertebral disc   . Sleep apnea     states uses O2 and CPAP at night  . Cervical neuralgia   . Elevated hemoglobin     Past Surgical History  Procedure Laterality Date  .  Esophagogastroduodenoscopy      Family History  Problem Relation Age of Onset  . Heart failure Father   . Hypertension Father     History  Substance Use Topics  . Smoking status: Never Smoker   . Smokeless tobacco: Never Used  . Alcohol Use: No      Review of Systems  Constitutional: Negative for appetite change and fatigue.  HENT: Negative for congestion, sinus pressure and ear discharge.   Eyes: Negative for discharge.  Respiratory: Negative for cough.   Cardiovascular: Negative for chest pain.  Gastrointestinal: Positive for vomiting and abdominal pain. Negative for diarrhea.  Genitourinary: Negative for frequency and hematuria.  Musculoskeletal: Negative for back pain.  Skin: Negative for rash.  Neurological: Negative for seizures and headaches.  Psychiatric/Behavioral: Negative for hallucinations.    Allergies  Lipitor  Home Medications   Current Outpatient Rx  Name  Route  Sig  Dispense  Refill  . aspirin 81 MG tablet   Oral   Take 81 mg by mouth daily.         . cyclobenzaprine (FLEXERIL) 10 MG tablet   Oral   Take 10 mg by mouth 3 (three) times daily as needed. For muscle relaxation          . diazepam (VALIUM) 5 MG tablet   Oral   Take 5 mg by mouth every 6 (six) hours as needed.         Marland Kitchen HYDROcodone-acetaminophen (NORCO) 10-325 MG  per tablet   Oral   Take 1 tablet by mouth every 4 (four) hours as needed (no greater than 6 per day). For pain   150 tablet   3   . HYDROmorphone (DILAUDID) 2 MG tablet   Oral   Take 2 mg by mouth daily as needed. For pain         . lisinopril-hydrochlorothiazide (PRINZIDE,ZESTORETIC) 20-12.5 MG per tablet   Oral   Take 1 tablet by mouth daily.         . nortriptyline (PAMELOR) 50 MG capsule   Oral   Take 50 mg by mouth. Two caps at bedtime         . ondansetron (ZOFRAN) 8 MG tablet   Oral   Take 1 tablet (8 mg total) by mouth every 8 (eight) hours as needed for nausea.   20 tablet   6   .  pantoprazole (PROTONIX) 40 MG tablet   Oral   Take 40 mg by mouth daily.         . potassium chloride SA (K-DUR,KLOR-CON) 20 MEQ tablet   Oral   Take 20 mEq by mouth daily.         . pravastatin (PRAVACHOL) 20 MG tablet   Oral   Take 20 mg by mouth every evening.          . Probiotic Product (PHILLIPS COLON HEALTH PO)   Oral   Take by mouth. One cap daily         . Testosterone (AXIRON) 30 MG/ACT SOLN   Transdermal   Place onto the skin. 2 pumps daily         . traMADol (ULTRAM) 50 MG tablet      TAKE 1 TABLET 4 TIMES A DAY AS NEEDED   20 tablet   2   . zolpidem (AMBIEN) 5 MG tablet   Oral   Take 5 mg by mouth at bedtime as needed for sleep.           BP 136/83  Pulse 107  Temp(Src) 97.9 F (36.6 C) (Oral)  Resp 20  SpO2 97%  Physical Exam  Constitutional: He is oriented to person, place, and time. He appears well-developed.  HENT:  Head: Normocephalic.  Eyes: Conjunctivae and EOM are normal. No scleral icterus.  Neck: Neck supple. No thyromegaly present.  Cardiovascular: Normal rate and regular rhythm.  Exam reveals no gallop and no friction rub.   No murmur heard. Pulmonary/Chest: No stridor. He has no wheezes. He has no rales. He exhibits no tenderness.  Abdominal: He exhibits no distension. There is tenderness. There is no rebound.  Moderate periumbilical tenderness.  Musculoskeletal: Normal range of motion. He exhibits no edema.  Lymphadenopathy:    He has no cervical adenopathy.  Neurological: He is oriented to person, place, and time. Coordination normal.  Skin: No rash noted. No erythema.  Psychiatric: He has a normal mood and affect. His behavior is normal.    ED Course  Procedures (including critical care time) DIAGNOSTIC STUDIES: Oxygen Saturation is 97% on room air, adequate by my interpretation.    COORDINATION OF CARE: 11:19 AM- Patient informed of current plan for treatment and evaluation and agrees with plan at this time.       Labs Reviewed - No data to display No results found.   No diagnosis found.    MDM      The chart was scribed for me under my direct supervision.  I personally performed  the history, physical, and medical decision making and all procedures in the evaluation of this patient.Bryan Lennert, MD 11/01/12 [phone]

## 2012-11-01 NOTE — ED Notes (Signed)
Pt c/o n/v// abd pain that started Sunday, has cyclic vomiting and takes dilaudid for the n/v, states that Zofran and phenergan does not work. Did take another dilaudid tablet this am and threw it back up.

## 2012-11-01 NOTE — Telephone Encounter (Signed)
Pt started having abd cramping Sunday. Taking dilaudid only has 2 left needs refill. Pt has not started vomiting yet but wants refill so he doesn't. Has been in the tub/shower keeping heat on the area. Does he need office visit or can refill be given.

## 2012-11-01 NOTE — Telephone Encounter (Signed)
May refill his diet log did. I will sign prescription. Patient will followup sooner if problems.

## 2012-11-01 NOTE — Telephone Encounter (Signed)
Dilaudid 2mg  #10 one q 6 hours prn script signed by dr Lorin Picket. Pt notified rx is ready for pick up on voicemail

## 2012-11-01 NOTE — ED Notes (Signed)
edp aware of pt. vo to give zofran 4mg  iv but pt refused at this time b/c he states it does not work. EDP aware

## 2012-11-01 NOTE — ED Notes (Signed)
Only vomited once while in ED at beginning of visit. Pt states he feels much better. Denies any pain, denies any nausea. Color wnl. Pt still alert/awake

## 2012-11-02 ENCOUNTER — Emergency Department (HOSPITAL_COMMUNITY)
Admission: EM | Admit: 2012-11-02 | Discharge: 2012-11-02 | Disposition: A | Discharge status: Home / Self Care | Payer: Managed Care, Other (non HMO) | Attending: Emergency Medicine | Admitting: Emergency Medicine

## 2012-11-02 ENCOUNTER — Encounter (HOSPITAL_COMMUNITY): Payer: Self-pay | Admitting: Emergency Medicine

## 2012-11-02 DIAGNOSIS — G4730 Sleep apnea, unspecified: Secondary | ICD-10-CM | POA: Insufficient documentation

## 2012-11-02 DIAGNOSIS — R1115 Cyclical vomiting syndrome unrelated to migraine: Secondary | ICD-10-CM | POA: Insufficient documentation

## 2012-11-02 DIAGNOSIS — Z8739 Personal history of other diseases of the musculoskeletal system and connective tissue: Secondary | ICD-10-CM | POA: Insufficient documentation

## 2012-11-02 DIAGNOSIS — Z862 Personal history of diseases of the blood and blood-forming organs and certain disorders involving the immune mechanism: Secondary | ICD-10-CM | POA: Insufficient documentation

## 2012-11-02 DIAGNOSIS — Z79899 Other long term (current) drug therapy: Secondary | ICD-10-CM | POA: Insufficient documentation

## 2012-11-02 DIAGNOSIS — R109 Unspecified abdominal pain: Secondary | ICD-10-CM | POA: Insufficient documentation

## 2012-11-02 DIAGNOSIS — Z7982 Long term (current) use of aspirin: Secondary | ICD-10-CM | POA: Insufficient documentation

## 2012-11-02 DIAGNOSIS — I10 Essential (primary) hypertension: Secondary | ICD-10-CM | POA: Insufficient documentation

## 2012-11-02 DIAGNOSIS — M549 Dorsalgia, unspecified: Secondary | ICD-10-CM | POA: Insufficient documentation

## 2012-11-02 DIAGNOSIS — Z8669 Personal history of other diseases of the nervous system and sense organs: Secondary | ICD-10-CM | POA: Insufficient documentation

## 2012-11-02 LAB — URINE MICROSCOPIC-ADD ON

## 2012-11-02 LAB — URINALYSIS, ROUTINE W REFLEX MICROSCOPIC
Bilirubin Urine: NEGATIVE
Glucose, UA: NEGATIVE mg/dL
Hgb urine dipstick: NEGATIVE
Ketones, ur: 15 mg/dL — AB
Leukocytes, UA: NEGATIVE
Nitrite: NEGATIVE
Specific Gravity, Urine: 1.025 (ref 1.005–1.030)
Urobilinogen, UA: 0.2 mg/dL (ref 0.0–1.0)
pH: 6.5 (ref 5.0–8.0)

## 2012-11-02 LAB — RAPID URINE DRUG SCREEN, HOSP PERFORMED
Amphetamines: NOT DETECTED
Barbiturates: NOT DETECTED
Benzodiazepines: NOT DETECTED
Cocaine: NOT DETECTED
Opiates: POSITIVE — AB
Tetrahydrocannabinol: NOT DETECTED

## 2012-11-02 MED ORDER — HYDROMORPHONE HCL PF 2 MG/ML IJ SOLN
2.0000 mg | INTRAMUSCULAR | Status: DC | PRN
  Administered 2012-11-02 (×2): 2 mg via INTRAVENOUS
  Filled 2012-11-02 (×3): qty 1

## 2012-11-02 MED ORDER — SODIUM CHLORIDE 0.9 % IV SOLN
Freq: Once | INTRAVENOUS | Status: AC
  Administered 2012-11-02: 1000 mL via INTRAVENOUS

## 2012-11-02 MED ORDER — HYDROMORPHONE HCL PF 2 MG/ML IJ SOLN
2.0000 mg | Freq: Once | INTRAMUSCULAR | Status: AC
  Administered 2012-11-02: 2 mg via INTRAVENOUS

## 2012-11-02 MED ORDER — PROMETHAZINE HCL 25 MG/ML IJ SOLN
12.5000 mg | Freq: Once | INTRAMUSCULAR | Status: AC
  Administered 2012-11-02: 12.5 mg via INTRAVENOUS
  Filled 2012-11-02: qty 1

## 2012-11-02 NOTE — ED Notes (Signed)
Gave pt carkers and a drink

## 2012-11-02 NOTE — ED Notes (Signed)
Pt here for vomiting. Pt wife states pt was here yesterday for same.

## 2012-11-02 NOTE — ED Notes (Signed)
Wife of pt had me push the dilaudid first, stating that phenergan or zofran never do him any good. After pushing meds, wife stated that it would need to be repeated in 30 minutes stating, "If you wait longer then we will have to start all over again." Information given to PA. No active vomiting since arrival to ED.

## 2012-11-02 NOTE — ED Provider Notes (Signed)
History     CSN: [id number]  Arrival date & time 11/02/12  0811   First MD Initiated Contact with Patient 11/02/12 0818      Chief Complaint  Patient presents with  . Nausea  . Emesis    (Consider location/radiation/quality/duration/timing/severity/associated sxs/prior treatment) HPI Comments: Pt is seen by Dr Lorin Picket A. Luking. He presents to ED with c/o cyclic vomiting. His significant other gives hx of pt having this problem for several year. She has a letter from Dr Gerda Diss stating the pt should receive Dilaudid for his cyclic events. Pt was in the ED on yesterday April 29 and treated for the same. The vomiting and pain returned this am and would not respond to home meds. Pt states he could not keep most of them down. Pt's friend gives a very detailed schedule for pt to receive the medications in order to help break this cycle. No reported injury or recent operation, or fever.  The history is provided by the patient.    Past Medical History  Diagnosis Date  . Cyclical vomiting   . Hypertension   . Slipped intervertebral disc   . Sleep apnea     states uses O2 and CPAP at night  . Cervical neuralgia   . Elevated hemoglobin     Past Surgical History  Procedure Laterality Date  . Esophagogastroduodenoscopy      Family History  Problem Relation Age of Onset  . Heart failure Father   . Hypertension Father     History  Substance Use Topics  . Smoking status: Never Smoker   . Smokeless tobacco: Never Used  . Alcohol Use: No      Review of Systems  Constitutional: Negative for activity change.       All ROS Neg except as noted in HPI  HENT: Negative for nosebleeds and neck pain.   Eyes: Negative for photophobia and discharge.  Respiratory: Negative for cough, shortness of breath and wheezing.   Cardiovascular: Negative for chest pain and palpitations.  Gastrointestinal: Positive for nausea, vomiting and abdominal pain. Negative for blood in stool.  Genitourinary:  Negative for dysuria, frequency and hematuria.  Musculoskeletal: Positive for back pain. Negative for arthralgias.  Skin: Negative.   Neurological: Negative for dizziness, seizures and speech difficulty.  Psychiatric/Behavioral: Negative for hallucinations and confusion.    Allergies  Lipitor  Home Medications   Current Outpatient Rx  Name  Route  Sig  Dispense  Refill  . aspirin EC 81 MG tablet   Oral   Take 81 mg by mouth daily.         . cyclobenzaprine (FLEXERIL) 10 MG tablet   Oral   Take 10 mg by mouth 3 (three) times daily as needed. For muscle relaxation          . diazepam (VALIUM) 5 MG tablet   Oral   Take 5 mg by mouth every 6 (six) hours as needed for anxiety.          Marland Kitchen HYDROcodone-acetaminophen (NORCO) 10-325 MG per tablet   Oral   Take 1 tablet by mouth every 4 (four) hours as needed (no greater than 6 per day). For pain   150 tablet   3   . HYDROmorphone (DILAUDID) 2 MG tablet   Oral   Take 2 mg by mouth daily as needed. For pain         . lisinopril-hydrochlorothiazide (PRINZIDE,ZESTORETIC) 20-12.5 MG per tablet   Oral   Take 1 tablet by  mouth daily.         . nortriptyline (PAMELOR) 50 MG capsule   Oral   Take 100 mg by mouth at bedtime. Two caps at bedtime         . ondansetron (ZOFRAN) 8 MG tablet   Oral   Take 1 tablet (8 mg total) by mouth every 8 (eight) hours as needed for nausea.   20 tablet   6   . pantoprazole (PROTONIX) 40 MG tablet   Oral   Take 40 mg by mouth daily.         . potassium chloride SA (K-DUR,KLOR-CON) 20 MEQ tablet   Oral   Take 40 mEq by mouth daily.          . pravastatin (PRAVACHOL) 20 MG tablet   Oral   Take 20 mg by mouth every evening.          . Probiotic Product (PHILLIPS COLON HEALTH PO)   Oral   Take 1 capsule by mouth daily. One cap daily         . Testosterone (AXIRON) 30 MG/ACT SOLN   Transdermal   Place 2 application onto the skin daily. 2 pumps daily         . traMADol  (ULTRAM) 50 MG tablet   Oral   Take 50 mg by mouth 4 (four) times daily.         Marland Kitchen zolpidem (AMBIEN) 5 MG tablet   Oral   Take 5 mg by mouth at bedtime as needed for sleep.           BP 150/100  Temp(Src) 99.1 F (37.3 C) (Oral)  Resp 16  Ht 5\' 7"  (1.702 m)  Wt 224 lb (101.606 kg)  BMI 35.08 kg/m2  Physical Exam  Nursing note and vitals reviewed. Constitutional: He is oriented to person, place, and time. He appears well-developed and well-nourished.  Non-toxic appearance. He has a sickly appearance.  HENT:  Head: Normocephalic.  Right Ear: Tympanic membrane and external ear normal.  Left Ear: Tympanic membrane and external ear normal.  Eyes: EOM and lids are normal. Pupils are equal, round, and reactive to light.  Neck: Normal range of motion. Neck supple. Carotid bruit is not present.  Cardiovascular: Normal rate, regular rhythm, normal heart sounds, intact distal pulses and normal pulses.   Pulmonary/Chest: Breath sounds normal. No respiratory distress.  Abdominal: Soft. Bowel sounds are normal.  Pt is curled over a pillow and will not cooperate for examination. Bowel sounds present.  Musculoskeletal: Normal range of motion.  Lymphadenopathy:       Head (right side): No submandibular adenopathy present.       Head (left side): No submandibular adenopathy present.    He has no cervical adenopathy.  Neurological: He is alert and oriented to person, place, and time. He has normal strength. No cranial nerve deficit or sensory deficit.  Skin: Skin is warm and dry.  Psychiatric: He has a normal mood and affect. His speech is normal.    ED Course  Procedures (including critical care time)  Labs Reviewed - No data to display No results found.   No diagnosis found.    MDM  I have reviewed nursing notes, vital signs, and all appropriate lab and imaging results for this patient.  9:58am - Nausea improving after the 2nd dose of dilaudid. Stomach cramping also  improving. Case discussed with Dr Stephannie Li. After 3 dose of dilaudid, pt sitting up talking with his  wife. No vomiting noted. Pt feels he can manage this issue at home. Pt encouraged to see Dr Gerda Diss in the next 3 to 5 days.    Kathie Dike, PA-C 11/07/12 2246

## 2012-11-04 ENCOUNTER — Observation Stay (HOSPITAL_COMMUNITY)
Admission: EM | Admit: 2012-11-04 | Discharge: 2012-11-04 | Disposition: A | Discharge status: Home / Self Care | Payer: Managed Care, Other (non HMO) | Attending: Emergency Medicine | Admitting: Emergency Medicine

## 2012-11-04 ENCOUNTER — Encounter (HOSPITAL_COMMUNITY): Payer: Self-pay | Admitting: Emergency Medicine

## 2012-11-04 DIAGNOSIS — R1110 Vomiting, unspecified: Secondary | ICD-10-CM | POA: Insufficient documentation

## 2012-11-04 DIAGNOSIS — I10 Essential (primary) hypertension: Secondary | ICD-10-CM | POA: Insufficient documentation

## 2012-11-04 DIAGNOSIS — R1115 Cyclical vomiting syndrome unrelated to migraine: Secondary | ICD-10-CM

## 2012-11-04 DIAGNOSIS — R197 Diarrhea, unspecified: Secondary | ICD-10-CM | POA: Insufficient documentation

## 2012-11-04 DIAGNOSIS — R1033 Periumbilical pain: Principal | ICD-10-CM | POA: Insufficient documentation

## 2012-11-04 LAB — COMPREHENSIVE METABOLIC PANEL
ALT: 41 U/L (ref 0–53)
AST: 22 U/L (ref 0–37)
Albumin: 4.5 g/dL (ref 3.5–5.2)
Alkaline Phosphatase: 74 U/L (ref 39–117)
BUN: 19 mg/dL (ref 6–23)
CO2: 25 mEq/L (ref 19–32)
Calcium: 9.9 mg/dL (ref 8.4–10.5)
Chloride: 98 mEq/L (ref 96–112)
Creatinine, Ser: 1.06 mg/dL (ref 0.50–1.35)
GFR calc Af Amer: >90 mL/min (ref >90)
GFR calc non Af Amer: 82 mL/min — ABNORMAL LOW (ref >90)
Glucose, Bld: 166 mg/dL — ABNORMAL HIGH (ref 70–99)
Potassium: 3.8 mEq/L (ref 3.5–5.1)
Sodium: 138 mEq/L (ref 135–145)
Total Bilirubin: 0.9 mg/dL (ref 0.3–1.2)
Total Protein: 8.2 g/dL (ref 6.0–8.3)

## 2012-11-04 LAB — LIPASE, BLOOD: Lipase: 23 U/L (ref 11–59)

## 2012-11-04 LAB — CBC WITH DIFFERENTIAL/PLATELET
Basophils Absolute: 0.1 10*3/uL (ref 0.0–0.1)
Basophils Relative: 0 % (ref 0–1)
Eosinophils Absolute: 0.1 10*3/uL (ref 0.0–0.7)
Eosinophils Relative: 1 % (ref 0–5)
HCT: 49.2 % (ref 39.0–52.0)
Hemoglobin: 18 g/dL — ABNORMAL HIGH (ref 13.0–17.0)
Lymphocytes Relative: 28 % (ref 12–46)
Lymphs Abs: 3.7 10*3/uL (ref 0.7–4.0)
MCH: 30.2 pg (ref 26.0–34.0)
MCHC: 36.6 g/dL — ABNORMAL HIGH (ref 30.0–36.0)
MCV: 82.4 fL (ref 78.0–100.0)
Monocytes Absolute: 0.8 10*3/uL (ref 0.1–1.0)
Monocytes Relative: 6 % (ref 3–12)
Neutro Abs: 8.5 10*3/uL — ABNORMAL HIGH (ref 1.7–7.7)
Neutrophils Relative %: 64 % (ref 43–77)
Platelets: 404 10*3/uL — ABNORMAL HIGH (ref 150–400)
RBC: 5.97 MIL/uL — ABNORMAL HIGH (ref 4.22–5.81)
RDW: 12.1 % (ref 11.5–15.5)
WBC: 13.2 10*3/uL — ABNORMAL HIGH (ref 4.0–10.5)

## 2012-11-04 MED ORDER — SODIUM CHLORIDE 0.9 % IV BOLUS (SEPSIS)
1000.0000 mL | Freq: Once | INTRAVENOUS | Status: AC
  Administered 2012-11-04: 1000 mL via INTRAVENOUS

## 2012-11-04 MED ORDER — LORAZEPAM 2 MG/ML IJ SOLN
1.0000 mg | Freq: Once | INTRAMUSCULAR | Status: AC
  Administered 2012-11-04: 1 mg via INTRAVENOUS

## 2012-11-04 MED ORDER — HYDROMORPHONE HCL PF 2 MG/ML IJ SOLN
INTRAMUSCULAR | Status: AC
  Filled 2012-11-04: qty 1

## 2012-11-04 MED ORDER — HYDROMORPHONE HCL PF 1 MG/ML IJ SOLN: 1.0000 mg | Freq: Once | INTRAMUSCULAR | Status: DC

## 2012-11-04 MED ORDER — HYDROMORPHONE HCL PF 2 MG/ML IJ SOLN: 2.0000 mg | Freq: Once | INTRAMUSCULAR | Status: AC

## 2012-11-04 MED ORDER — LORAZEPAM 2 MG/ML IJ SOLN
INTRAMUSCULAR | Status: AC
  Filled 2012-11-04: qty 1

## 2012-11-04 MED ORDER — HYDROMORPHONE HCL PF 2 MG/ML IJ SOLN
INTRAMUSCULAR | Status: AC
  Administered 2012-11-04: 2 mg via INTRAVENOUS
  Filled 2012-11-04: qty 1

## 2012-11-04 MED ORDER — HYDROMORPHONE HCL PF 2 MG/ML IJ SOLN
2.0000 mg | Freq: Once | INTRAMUSCULAR | Status: AC
  Administered 2012-11-04: 2 mg via INTRAVENOUS
  Filled 2012-11-04: qty 1

## 2012-11-04 MED ORDER — HYDROMORPHONE HCL PF 1 MG/ML IJ SOLN
2.0000 mg | Freq: Once | INTRAMUSCULAR | Status: AC
  Administered 2012-11-04: 2 mg via INTRAVENOUS

## 2012-11-04 NOTE — ED Notes (Signed)
Pt c/o cyclic vomiting. Pt c/o abd pain/n/v/d since 0600.

## 2012-11-04 NOTE — ED Notes (Signed)
Pt requesting crackers and ginger ale, drink and crackers provided, pt states that he does not want to be admitted, denies pain at this time, Idol, PA wants current IVF increased to 943ml/hr, which has been done, will continue to monitor

## 2012-11-04 NOTE — ED Notes (Signed)
Patient states pain is only down to 7 and that this episode is worse that last few.  Okayed w/Julie Idol, PA to give Dilaudid early.

## 2012-11-04 NOTE — ED Provider Notes (Signed)
History     CSN: [id number]  Arrival date & time 11/04/12  0802   First MD Initiated Contact with Patient 11/04/12 [phone]      Chief Complaint  Patient presents with  . Emesis  . Abdominal Pain  . Diarrhea    (Consider location/radiation/quality/duration/timing/severity/associated sxs/prior treatment) HPI Comments: Bryan Gilbert is a 47 y.o. Male presenting with his 3rd ed visit in 4 days of uncontrolled nonbloody vomiting, with diarrhea and periumbilical abdominal pain which is relieved with emesis and consistent with his cyclic vomiting syndrome.  He is currently followed for this by a GI specialist at [facility],  With his home dilaudid tablets filled by his pcp Dr Gerda Diss.  He attempted to take his last dose shortly after 6 am when his symptoms returned,  But promptly vomited this up.  He has had no fevers or chills and he reports his current symptoms are similar to previous episodes of this syndrome.  His wife states by the 3rd episode he is usually admitted for supportive care.  Antiemetics and intestinal antispasmodics generally do not relieve his symptoms.     The history is provided by the patient and the spouse.    Past Medical History  Diagnosis Date  . Cyclical vomiting   . Hypertension   . Slipped intervertebral disc   . Sleep apnea     states uses O2 and CPAP at night  . Cervical neuralgia   . Elevated hemoglobin     Past Surgical History  Procedure Laterality Date  . Esophagogastroduodenoscopy      Family History  Problem Relation Age of Onset  . Heart failure Father   . Hypertension Father     History  Substance Use Topics  . Smoking status: Never Smoker   . Smokeless tobacco: Never Used  . Alcohol Use: No      Review of Systems  Constitutional: Negative for fever.  HENT: Negative for congestion, sore throat and neck pain.   Eyes: Negative.   Respiratory: Negative for chest tightness and shortness of breath.   Cardiovascular: Negative for chest pain.   Gastrointestinal: Positive for nausea, vomiting, abdominal pain and diarrhea. Negative for blood in stool.  Genitourinary: Negative.   Musculoskeletal: Negative for joint swelling and arthralgias.  Skin: Negative.  Negative for rash and wound.  Neurological: Negative for dizziness, weakness, light-headedness, numbness and headaches.  Psychiatric/Behavioral: Negative.     Allergies  Lipitor  Home Medications   Current Outpatient Rx  Name  Route  Sig  Dispense  Refill  . aspirin EC 81 MG tablet   Oral   Take 81 mg by mouth daily.         . cyclobenzaprine (FLEXERIL) 10 MG tablet   Oral   Take 10 mg by mouth 3 (three) times daily as needed. For muscle relaxation          . diazepam (VALIUM) 5 MG tablet   Oral   Take 5 mg by mouth every 6 (six) hours as needed for anxiety.          Marland Kitchen HYDROcodone-acetaminophen (NORCO) 10-325 MG per tablet   Oral   Take 1 tablet by mouth every 4 (four) hours as needed (no greater than 6 per day). For pain   150 tablet   3   . HYDROmorphone (DILAUDID) 2 MG tablet   Oral   Take 2 mg by mouth daily as needed. For pain         . lisinopril-hydrochlorothiazide (PRINZIDE,ZESTORETIC)  20-12.5 MG per tablet   Oral   Take 1 tablet by mouth daily.         . nortriptyline (PAMELOR) 50 MG capsule   Oral   Take 100 mg by mouth at bedtime. Two caps at bedtime         . ondansetron (ZOFRAN) 8 MG tablet   Oral   Take 1 tablet (8 mg total) by mouth every 8 (eight) hours as needed for nausea.   20 tablet   6   . pantoprazole (PROTONIX) 40 MG tablet   Oral   Take 40 mg by mouth daily.         . potassium chloride SA (K-DUR,KLOR-CON) 20 MEQ tablet   Oral   Take 40 mEq by mouth daily.          . pravastatin (PRAVACHOL) 20 MG tablet   Oral   Take 20 mg by mouth every evening.          . Probiotic Product (PHILLIPS COLON HEALTH PO)   Oral   Take 1 capsule by mouth daily. One cap daily         . Testosterone (AXIRON) 30  MG/ACT SOLN   Transdermal   Place 2 application onto the skin daily. 2 pumps daily         . traMADol (ULTRAM) 50 MG tablet   Oral   Take 50 mg by mouth 4 (four) times daily.         Marland Kitchen zolpidem (AMBIEN) 5 MG tablet   Oral   Take 5 mg by mouth at bedtime as needed for sleep.           BP 129/89  Pulse 92  Temp(Src) 98.3 F (36.8 C) (Oral)  Resp 20  Ht 5\' 7"  (1.702 m)  Wt 220 lb (99.791 kg)  BMI 34.45 kg/m2  SpO2 97%  Physical Exam  Nursing note and vitals reviewed. Constitutional: He appears well-developed. He appears ill.  Sitting hunched forward,  Unable to lie back secondary to pain.  HENT:  Head: Normocephalic and atraumatic.  Mouth/Throat: Mucous membranes are dry.  Eyes: Conjunctivae are normal.  Neck: Normal range of motion.  Cardiovascular: Normal rate, regular rhythm, normal heart sounds and intact distal pulses.   Pulmonary/Chest: Effort normal and breath sounds normal. He has no wheezes.  Abdominal: Soft. Bowel sounds are normal. There is tenderness. There is no rebound and no guarding.  Musculoskeletal: Normal range of motion.  Neurological: He is alert.  Skin: Skin is warm and dry.  Psychiatric: He has a normal mood and affect.    ED Course  Procedures (including critical care time)  Labs Reviewed  CBC WITH DIFFERENTIAL  COMPREHENSIVE METABOLIC PANEL  LIPASE, BLOOD   No results found.   No diagnosis found.  Medications  HYDROmorphone (DILAUDID) injection 1 mg (not administered)  HYDROmorphone (DILAUDID) 2 MG/ML injection (not administered)  sodium chloride 0.9 % bolus 1,000 mL (1,000 mLs Intravenous New Bag/Given 11/04/12 0836)  HYDROmorphone (DILAUDID) injection 2 mg (2 mg Intravenous Given 11/04/12 0831)  HYDROmorphone (DILAUDID) injection 2 mg (2 mg Intravenous Given 11/04/12 0858)  LORazepam (ATIVAN) injection 1 mg (1 mg Intravenous Given 11/04/12 0950)     MDM  Pt has been given multiple doses of dilaudid per IV with moderate but not  complete relief of pain or nausea. Has persistent epigastric and ruq discomfort.  Still with nausea.   Third visit over the past 4 days without breaking this acute cyclic vomiting  episode.  He does have an elevated wbc count,  Possibly due to vomiting itself.  He does not have an acute abdomen at re-exam.  Will plan for admission.  Discussed with Dr Hyacinth Meeker who agrees with plan.  Discussed with Dr. Lendell Caprice who will admit to obs.  Temp admit orders written.  11:27 AM called back to pts room.  States he is feeling improved now and does not wish to be admitted.  He states he tolerated his sx yesterday by waking at  3 am,  Ate a small piece of bread,  Then applied a heating pad to his abd. For about 30 minutes and had a relatively comfortable day.  Has decided to try this again tonight, prefers not to come into the hospital at this time.  Advised to return for any worsened sx., uncontrolled vomiting, or if he develops fever.  Discussed elevated wbc count which he has had in the past with prior episodes of vomiting syndrome.  He has no lower abd pain,  Specifically no RLQ pain.  Call placed to Dr Lendell Caprice to inform of pts decision to go home.          Burgess Amor, PA-C 11/04/12 1051  Burgess Amor, PA-C 11/04/12 1130

## 2012-11-05 NOTE — ED Provider Notes (Signed)
Medical screening examination/treatment/procedure(s) were performed by non-physician practitioner and as supervising physician I was immediately available for consultation/collaboration.    Vida Roller, MD 11/05/12 [phone]

## 2012-11-07 ENCOUNTER — Other Ambulatory Visit: Payer: Self-pay | Admitting: Family Medicine

## 2012-11-07 ENCOUNTER — Emergency Department (HOSPITAL_COMMUNITY)
Admission: EM | Admit: 2012-11-07 | Discharge: 2012-11-07 | Disposition: A | Discharge status: Home / Self Care | Payer: Managed Care, Other (non HMO) | Attending: Emergency Medicine | Admitting: Emergency Medicine

## 2012-11-07 ENCOUNTER — Encounter (HOSPITAL_COMMUNITY): Payer: Self-pay | Admitting: *Deleted

## 2012-11-07 DIAGNOSIS — Z8669 Personal history of other diseases of the nervous system and sense organs: Secondary | ICD-10-CM | POA: Insufficient documentation

## 2012-11-07 DIAGNOSIS — R109 Unspecified abdominal pain: Secondary | ICD-10-CM

## 2012-11-07 DIAGNOSIS — Z7982 Long term (current) use of aspirin: Secondary | ICD-10-CM | POA: Insufficient documentation

## 2012-11-07 DIAGNOSIS — Z8739 Personal history of other diseases of the musculoskeletal system and connective tissue: Secondary | ICD-10-CM | POA: Insufficient documentation

## 2012-11-07 DIAGNOSIS — I10 Essential (primary) hypertension: Secondary | ICD-10-CM | POA: Insufficient documentation

## 2012-11-07 DIAGNOSIS — Z79899 Other long term (current) drug therapy: Secondary | ICD-10-CM | POA: Insufficient documentation

## 2012-11-07 DIAGNOSIS — Z862 Personal history of diseases of the blood and blood-forming organs and certain disorders involving the immune mechanism: Secondary | ICD-10-CM | POA: Insufficient documentation

## 2012-11-07 DIAGNOSIS — G4730 Sleep apnea, unspecified: Secondary | ICD-10-CM | POA: Insufficient documentation

## 2012-11-07 LAB — CBC WITH DIFFERENTIAL/PLATELET
Basophils Absolute: 0 10*3/uL (ref 0.0–0.1)
Basophils Relative: 0 % (ref 0–1)
Eosinophils Absolute: 0 10*3/uL (ref 0.0–0.7)
Eosinophils Relative: 0 % (ref 0–5)
HCT: 48.7 % (ref 39.0–52.0)
Hemoglobin: 17.7 g/dL — ABNORMAL HIGH (ref 13.0–17.0)
Lymphocytes Relative: 14 % (ref 12–46)
Lymphs Abs: 2 10*3/uL (ref 0.7–4.0)
MCH: 30.1 pg (ref 26.0–34.0)
MCHC: 36.3 g/dL — ABNORMAL HIGH (ref 30.0–36.0)
MCV: 82.8 fL (ref 78.0–100.0)
Monocytes Absolute: 0.8 10*3/uL (ref 0.1–1.0)
Monocytes Relative: 6 % (ref 3–12)
Neutro Abs: 11.3 10*3/uL — ABNORMAL HIGH (ref 1.7–7.7)
Neutrophils Relative %: 80 % — ABNORMAL HIGH (ref 43–77)
Platelets: 421 10*3/uL — ABNORMAL HIGH (ref 150–400)
RBC: 5.88 MIL/uL — ABNORMAL HIGH (ref 4.22–5.81)
RDW: 12.1 % (ref 11.5–15.5)
WBC: 14.2 10*3/uL — ABNORMAL HIGH (ref 4.0–10.5)

## 2012-11-07 LAB — BASIC METABOLIC PANEL
BUN: 21 mg/dL (ref 6–23)
CO2: 23 mEq/L (ref 19–32)
Calcium: 10.4 mg/dL (ref 8.4–10.5)
Chloride: 95 mEq/L — ABNORMAL LOW (ref 96–112)
Creatinine, Ser: 1.09 mg/dL (ref 0.50–1.35)
GFR calc Af Amer: >90 mL/min (ref >90)
GFR calc non Af Amer: 80 mL/min — ABNORMAL LOW (ref >90)
Glucose, Bld: 202 mg/dL — ABNORMAL HIGH (ref 70–99)
Potassium: 4.2 mEq/L (ref 3.5–5.1)
Sodium: 134 mEq/L — ABNORMAL LOW (ref 135–145)

## 2012-11-07 MED ORDER — HYDROMORPHONE HCL PF 2 MG/ML IJ SOLN
2.0000 mg | Freq: Once | INTRAMUSCULAR | Status: AC
  Administered 2012-11-07: 2 mg via INTRAVENOUS
  Filled 2012-11-07: qty 1

## 2012-11-07 MED ORDER — ONDANSETRON HCL 4 MG/2ML IJ SOLN
4.0000 mg | Freq: Once | INTRAMUSCULAR | Status: AC
  Administered 2012-11-07: 4 mg via INTRAVENOUS
  Filled 2012-11-07: qty 2

## 2012-11-07 MED ORDER — SODIUM CHLORIDE 0.9 % IV BOLUS (SEPSIS)
500.0000 mL | Freq: Once | INTRAVENOUS | Status: AC
  Administered 2012-11-07: 19:00:00 via INTRAVENOUS

## 2012-11-07 NOTE — ED Provider Notes (Signed)
History     CSN: [id number]  Arrival date & time 11/07/12  1643   First MD Initiated Contact with Patient 11/07/12 1809      Chief Complaint  Patient presents with  . Emesis    (Consider location/radiation/quality/duration/timing/severity/associated sxs/prior treatment) Patient is a 47 y.o. male presenting with vomiting. The history is provided by the patient (the pt complains of vomiting). No language interpreter was used.  Emesis Severity:  Moderate Timing:  Constant Quality:  Bilious material Able to tolerate:  Liquids Progression:  Unchanged Associated symptoms: no abdominal pain, no diarrhea and no headaches     Past Medical History  Diagnosis Date  . Cyclical vomiting   . Hypertension   . Slipped intervertebral disc   . Sleep apnea     states uses O2 and CPAP at night  . Cervical neuralgia   . Elevated hemoglobin     Past Surgical History  Procedure Laterality Date  . Esophagogastroduodenoscopy      Family History  Problem Relation Age of Onset  . Heart failure Father   . Hypertension Father     History  Substance Use Topics  . Smoking status: Never Smoker   . Smokeless tobacco: Never Used  . Alcohol Use: No      Review of Systems  Constitutional: Negative for appetite change and fatigue.  HENT: Negative for congestion, sinus pressure and ear discharge.   Eyes: Negative for discharge.  Respiratory: Negative for cough.   Cardiovascular: Negative for chest pain.  Gastrointestinal: Positive for vomiting. Negative for abdominal pain and diarrhea.  Genitourinary: Negative for frequency and hematuria.  Musculoskeletal: Negative for back pain.  Skin: Negative for rash.  Neurological: Negative for seizures and headaches.  Psychiatric/Behavioral: Negative for hallucinations.    Allergies  Lipitor  Home Medications   Current Outpatient Rx  Name  Route  Sig  Dispense  Refill  . aspirin EC 81 MG tablet   Oral   Take 81 mg by mouth at bedtime.          . cyclobenzaprine (FLEXERIL) 10 MG tablet   Oral   Take 10 mg by mouth 3 (three) times daily as needed. For muscle relaxation          . diazepam (VALIUM) 5 MG tablet   Oral   Take 5 mg by mouth every 6 (six) hours as needed for anxiety.          Marland Kitchen HYDROcodone-acetaminophen (NORCO) 10-325 MG per tablet   Oral   Take 1 tablet by mouth every 4 (four) hours as needed (no greater than 6 per day). For pain   150 tablet   3   . HYDROmorphone (DILAUDID) 2 MG tablet   Oral   Take 2 mg by mouth daily as needed. For pain         . lisinopril-hydrochlorothiazide (PRINZIDE,ZESTORETIC) 20-12.5 MG per tablet   Oral   Take 1 tablet by mouth at bedtime.          . nortriptyline (PAMELOR) 50 MG capsule   Oral   Take 100 mg by mouth at bedtime. Two caps at bedtime         . pantoprazole (PROTONIX) 40 MG tablet   Oral   Take 40 mg by mouth at bedtime.          . potassium chloride SA (K-DUR,KLOR-CON) 20 MEQ tablet   Oral   Take 40 mEq by mouth at bedtime.          Marland Kitchen  pravastatin (PRAVACHOL) 20 MG tablet   Oral   Take 20 mg by mouth every evening.          . Probiotic Product (PHILLIPS COLON HEALTH PO)   Oral   Take 1 capsule by mouth at bedtime. One cap daily         . Testosterone (AXIRON) 30 MG/ACT SOLN   Transdermal   Place 2 application onto the skin daily. 2 pumps daily         . traMADol (ULTRAM) 50 MG tablet   Oral   Take 50 mg by mouth 4 (four) times daily.         Marland Kitchen zolpidem (AMBIEN) 5 MG tablet   Oral   Take 5 mg by mouth at bedtime as needed for sleep.         Marland Kitchen ondansetron (ZOFRAN) 8 MG tablet   Oral   Take 1 tablet (8 mg total) by mouth every 8 (eight) hours as needed for nausea.   20 tablet   6     BP 135/73  Pulse 99  Temp(Src) 0 F (-17.8 C)  Resp 18  Ht 5\' 7"  (1.702 m)  Wt 220 lb (99.791 kg)  BMI 34.45 kg/m2  SpO2 94%  Physical Exam  Constitutional: He is oriented to person, place, and time. He appears  well-developed.  HENT:  Head: Normocephalic.  Eyes: Conjunctivae and EOM are normal. No scleral icterus.  Neck: Neck supple. No thyromegaly present.  Cardiovascular: Normal rate and regular rhythm.  Exam reveals no gallop and no friction rub.   No murmur heard. Pulmonary/Chest: No stridor. He has no wheezes. He has no rales. He exhibits no tenderness.  Abdominal: He exhibits no distension. There is tenderness. There is no rebound.  Musculoskeletal: Normal range of motion. He exhibits no edema.  Lymphadenopathy:    He has no cervical adenopathy.  Neurological: He is oriented to person, place, and time. Coordination normal.  Skin: No rash noted. No erythema.  Psychiatric: He has a normal mood and affect. His behavior is normal.    ED Course  Procedures (including critical care time)  Labs Reviewed  CBC WITH DIFFERENTIAL - Abnormal; Notable for the following:    WBC 14.2 (*)    RBC 5.88 (*)    Hemoglobin 17.7 (*)    MCHC 36.3 (*)    Platelets 421 (*)    Neutrophils Relative 80 (*)    Neutro Abs 11.3 (*)    All other components within normal limits  BASIC METABOLIC PANEL - Abnormal; Notable for the following:    Sodium 134 (*)    Chloride 95 (*)    Glucose, Bld 202 (*)    GFR calc non Af Amer 80 (*)    All other components within normal limits   No results found.   1. Abdominal pain       MDM         Benny Lennert, MD 11/07/12 2048

## 2012-11-07 NOTE — ED Notes (Signed)
Pt has cyclical vomiting, Restarted today.

## 2012-11-07 NOTE — ED Notes (Signed)
Vomiting at triage 

## 2012-11-07 NOTE — ED Notes (Signed)
Report per wife: Pt has been here 3 previous time for the same "cycle", and refused to be admitted last week. Pt reports relief with a total of 6 mg of Dilaudid.

## 2012-11-07 NOTE — ED Notes (Signed)
Pt given crackers and gingerale.

## 2012-11-09 NOTE — ED Provider Notes (Signed)
Medical screening examination/treatment/procedure(s) were performed by non-physician practitioner and as supervising physician I was immediately available for consultation/collaboration.    Daequan Kozma D Ahja Martello, MD 11/09/12 2336 

## 2012-11-21 ENCOUNTER — Other Ambulatory Visit: Payer: Self-pay | Admitting: Family Medicine

## 2012-11-21 ENCOUNTER — Telehealth: Payer: Self-pay | Admitting: Family Medicine

## 2012-11-21 NOTE — Telephone Encounter (Signed)
Patient scheduled an appointment for Thursday Nov 24, 2012 at 9:00am with Dr. Lorin Picket for follow up from ER visits from 4 times recently.  Wants a refill on Dilaudid to use for his Cyclic Vomiting.  Please call patient.

## 2012-11-21 NOTE — Telephone Encounter (Signed)
Patient may have refill on Dilaudid tablets. He should keep his appointment. 2 mg. #8. 1 every 6 hours as needed for severe abdominal pain, cautioned drowsiness

## 2012-11-21 NOTE — Telephone Encounter (Signed)
Pt to pick up rx at office.

## 2012-11-24 ENCOUNTER — Encounter: Payer: Self-pay | Admitting: Family Medicine

## 2012-11-24 ENCOUNTER — Ambulatory Visit (INDEPENDENT_AMBULATORY_CARE_PROVIDER_SITE_OTHER): Payer: 59 | Admitting: Family Medicine

## 2012-11-24 VITALS — BP 118/76 | Temp 98.0°F | Wt 210.0 lb

## 2012-11-24 DIAGNOSIS — M542 Cervicalgia: Secondary | ICD-10-CM

## 2012-11-24 DIAGNOSIS — D751 Secondary polycythemia: Secondary | ICD-10-CM

## 2012-11-24 DIAGNOSIS — D45 Polycythemia vera: Secondary | ICD-10-CM

## 2012-11-24 MED ORDER — NORTRIPTYLINE HCL 50 MG PO CAPS: 50.0000 mg | ORAL_CAPSULE | Freq: Every day | ORAL | Status: DC

## 2012-11-24 NOTE — Progress Notes (Signed)
  Subjective:    Patient ID: Bryan Gilbert, male    DOB: Nov 21, 1965, 47 y.o.   MRN: [id number]  Emesis  This is a chronic problem. The current episode started more than 1 year ago.  No diarrhea. No fever chills or sweats. He relates he's had multiple episodes of this. It is felt to be cyclical vomiting syndrome he will have an intense pain in the mid epigastric area along with nausea sometimes diarrhea multiple episodes of vomiting he has tried numerous medicines to help but nothing is seen to help the only thing that has ever helped was Dilaudid IV has helped. He does have limited number of these tablets that he uses.We have tried to use nortriptyline to help minimize this so far 100 mg every evening has not helped  Neck pain-he has a disc problem in his neck chronic pain in the neck radiates into the arm he had seen a specialist at [facility] they set him up for a injection he's had 2 separate injections which seemed to help. He feels he is having increased pain discomfort having to take hydrocodone anywhere between 1 and 6 times a day he would like to see the specialist again to have another injection.  He has been under a lot of stress at home. He and his wife were together but their relationship is not solid. He is uncertain if that will get any better. He hopes his stress levels will get better.  Family history noncontributory social history does not smoke  Review of Systems  Gastrointestinal: Positive for vomiting.   Negative for headaches fevers chills swelling in the legs negative for hematemesis or hematochezia    Objective:   Physical Exam Lungs are clear, heart is regular, abdomen soft slightly overweight extremities no edema skin warm dry       Assessment & Plan:  Cyclical vomiting syndrome-nortriptyline increased to 150 mg each evening. He will use a 50 mg capsule 3 each evening. He will followup within 3 months to see how this is doing Dilaudid 2 mg will be used when necessary. He  was just recently given a prescription for 8 tablets. Hydrocodone no greater than 6 times a day to help him with the neck pain. To use it less often when possible Hypertension blood pressure stable Neck pain-we will try to arrange for injection. Patient followup in 3 months

## 2012-11-30 ENCOUNTER — Other Ambulatory Visit: Payer: Self-pay | Admitting: *Deleted

## 2012-11-30 MED ORDER — LISINOPRIL-HYDROCHLOROTHIAZIDE 20-12.5 MG PO TABS: 1.0000 | ORAL_TABLET | Freq: Every day | ORAL | Status: DC

## 2012-12-01 ENCOUNTER — Telehealth: Payer: Self-pay | Admitting: Family Medicine

## 2012-12-01 NOTE — Telephone Encounter (Signed)
Patient needs a refill of Dilaudid

## 2012-12-01 NOTE — Telephone Encounter (Signed)
diluadid 2mg , #8 , one q6 hours prn , not for frequent use

## 2012-12-02 ENCOUNTER — Telehealth: Payer: Self-pay | Admitting: *Deleted

## 2012-12-02 ENCOUNTER — Other Ambulatory Visit: Payer: Self-pay | Admitting: *Deleted

## 2012-12-02 MED ORDER — HYDROMORPHONE HCL 2 MG PO TABS: 2.0000 mg | ORAL_TABLET | Freq: Four times a day (QID) | ORAL | Status: DC | PRN

## 2012-12-02 NOTE — Telephone Encounter (Signed)
Discussed with wife that prescription is written for 1 or 2 the max due to respiratory depression.

## 2012-12-02 NOTE — Telephone Encounter (Signed)
rx ready for pick up. Wife notifie.d  

## 2012-12-02 NOTE — Telephone Encounter (Signed)
Pt is having to take to 3 or 4 at a time. So 8 will only last two episodes. He had one episode last Thursday and one episode yesterday.

## 2012-12-23 ENCOUNTER — Other Ambulatory Visit: Payer: Self-pay | Admitting: Family Medicine

## 2012-12-23 NOTE — Telephone Encounter (Signed)
Ok this with 4 refills

## 2012-12-24 ENCOUNTER — Other Ambulatory Visit: Payer: Self-pay | Admitting: Family Medicine

## 2012-12-24 NOTE — Telephone Encounter (Signed)
RX called into pharmacy

## 2012-12-26 NOTE — Telephone Encounter (Signed)
Ambien 5 mg with 4 refills called into vm at [facility] [place]

## 2012-12-26 NOTE — Telephone Encounter (Signed)
Okay x5 total

## 2013-01-18 ENCOUNTER — Other Ambulatory Visit: Payer: Self-pay | Admitting: Family Medicine

## 2013-01-18 NOTE — Telephone Encounter (Signed)
Last seen for pain management 09/20/12

## 2013-01-18 NOTE — Telephone Encounter (Signed)
Ok times one mo needs ov before refilling

## 2013-01-18 NOTE — Telephone Encounter (Signed)
Ok times one mo needs ov w scott

## 2013-01-19 ENCOUNTER — Other Ambulatory Visit: Payer: Self-pay | Admitting: Family Medicine

## 2013-01-19 NOTE — Telephone Encounter (Signed)
Ok plus two ref thought we did last eve

## 2013-03-15 ENCOUNTER — Encounter: Payer: Self-pay | Admitting: Family Medicine

## 2013-03-15 ENCOUNTER — Ambulatory Visit (INDEPENDENT_AMBULATORY_CARE_PROVIDER_SITE_OTHER): Payer: 59 | Admitting: Family Medicine

## 2013-03-15 VITALS — BP 122/78 | Ht 66.0 in | Wt 207.0 lb

## 2013-03-15 DIAGNOSIS — R7309 Other abnormal glucose: Secondary | ICD-10-CM

## 2013-03-15 DIAGNOSIS — R739 Hyperglycemia, unspecified: Secondary | ICD-10-CM

## 2013-03-15 DIAGNOSIS — R6889 Other general symptoms and signs: Secondary | ICD-10-CM

## 2013-03-15 DIAGNOSIS — D751 Secondary polycythemia: Secondary | ICD-10-CM

## 2013-03-15 DIAGNOSIS — E291 Testicular hypofunction: Secondary | ICD-10-CM

## 2013-03-15 DIAGNOSIS — R7989 Other specified abnormal findings of blood chemistry: Secondary | ICD-10-CM

## 2013-03-15 DIAGNOSIS — I10 Essential (primary) hypertension: Secondary | ICD-10-CM

## 2013-03-15 DIAGNOSIS — G4733 Obstructive sleep apnea (adult) (pediatric): Secondary | ICD-10-CM

## 2013-03-15 MED ORDER — HYDROCODONE-ACETAMINOPHEN 10-325 MG PO TABS: ORAL_TABLET | ORAL | Status: DC

## 2013-03-15 NOTE — Progress Notes (Addendum)
  Subjective:    Patient ID: Bryan Gilbert, male    DOB: July 12, 1965, 47 y.o.   MRN: [id number]  HPI Patient arrives for a follow up on his meds. Also here to discuss CPAP machine. Patient has stopped his Pravastatin and Pamelor. Patient relates that he read a lot about pravastatin in the arms up but he does not want to take it anymore he states he would just manage with diet he understands the importance of keeping cholesterol looked good is not interested in other medicines  He states the Pamelor did not help his cyclical vomiting syndrome so therefore he stopped taking this medicine.  Patient does have sleep apnea he relates that his insurance is requiring him to repeat nighttime study of oxygen to see if he still needs a supplement. All of this is pending currently he is setting this up if he has difficult time he'll let us know  Patient relates moderate fatigue tiredness feeling run down states he stopped taking testosterone several weeks back but at the same time he feels he needs it. He would like to have some testing done. PMH family history social history noncontributory he is separated from his wife that is causing some stress see above Review of Systems    see above Objective:   Physical Exam Neck no masses throat normal lungs are clear no crackles heart regular abdomen soft no guarding rebound extremities no edema skin warm dry       Assessment & Plan:  #1 sleep apnea-patient is to get monitor download plus overnight testing to see how well his oxygen levels are. He is having a hard time getting this accomplished he will let us know we will set, needs over night BiPAP w/o O@ to monitor O2 levels #2 hyperlipidemia I advised to keep cholesterol in good control did lab tests to see where exactly does not want to be on medicine #3 hypergonadism-when he did take testosterone his hemoglobin went up. I feel that the patient would be wise not to be on testosterone he understands my point  of view. #4 HTN decent control #5 cyclical vomiting syndrome-Dilaudid when necessary recently it has been under good control this. Patient to followup in 3 months. #6 chronic neck pain he will be getting back in with a specialist for more injections refills on hydrocodone given see back in 3 months

## 2013-03-15 NOTE — Patient Instructions (Signed)
As part of your visit today we have covered chronic pain. You have been given prescription for pain medicines. Certain pain medications such as hydrocodone  allow for refills. Other pain medications such as oxycodone require separate prescriptions. Nonetheless, your expected to come in for a office visit before further pain medications are issued.   We will not refill medications or early nor will we give an extended month supply at the end of these prescriptions.It is your responsibility to keep up with medications. They will not be replaced.  It is your responsibility to schedule an office visit in 3-4 months to be seen before you are out of your medication. Do not call our office to request early refills or additional refills.   We are required by law to adhere to regulations. Failure on our part to follow these regulations could jeopardize our prescription license which in turn would cause us not to be able to care for you.  

## 2013-04-24 ENCOUNTER — Telehealth: Payer: Self-pay | Admitting: Family Medicine

## 2013-04-24 MED ORDER — HYDROCODONE-ACETAMINOPHEN 10-325 MG PO TABS: ORAL_TABLET | ORAL | Status: DC

## 2013-04-24 MED ORDER — TRAMADOL HCL 50 MG PO TABS: 50.0000 mg | ORAL_TABLET | Freq: Four times a day (QID) | ORAL | Status: DC | PRN

## 2013-04-24 MED ORDER — CYCLOBENZAPRINE HCL 10 MG PO TABS: 10.0000 mg | ORAL_TABLET | Freq: Three times a day (TID) | ORAL | Status: DC | PRN

## 2013-04-24 NOTE — Telephone Encounter (Signed)
Wife states Gared needs these prescriptions, she would like to pick these up today.   traMADol (ULTRAM) 50 MG tablet cyclobenzaprine (FLEXERIL) 10 MG tablet HYDROcodone-acetaminophen (NORCO) 10-325 MG per tablet  ---  Patient is now taking six a day.  Will be going back to five a day.  Please call Patient. Thanks

## 2013-04-24 NOTE — Telephone Encounter (Signed)
May give 2 refills on tramadol 6 refills on flexeril 2 scripts on Hydrocodone 10/325, #180,1 q 4 hours OV before more scripts

## 2013-04-24 NOTE — Telephone Encounter (Signed)
Last office visit 03/15/13

## 2013-04-24 NOTE — Addendum Note (Signed)
Addended by: Dereck Ligas on: 04/24/2013 02:15 PM   Modules accepted: Orders

## 2013-04-24 NOTE — Telephone Encounter (Signed)
Tramadol and hydrocodone scripts ready for pickup. Flexeril was sent to pharmacy. Wife was notified.

## 2013-04-25 ENCOUNTER — Ambulatory Visit (INDEPENDENT_AMBULATORY_CARE_PROVIDER_SITE_OTHER): Payer: Managed Care, Other (non HMO) | Admitting: Family Medicine

## 2013-04-25 ENCOUNTER — Encounter: Payer: Self-pay | Admitting: Family Medicine

## 2013-04-25 VITALS — BP 110/70 | Temp 98.7°F | Ht 66.0 in | Wt 217.0 lb

## 2013-04-25 DIAGNOSIS — H6091 Unspecified otitis externa, right ear: Secondary | ICD-10-CM

## 2013-04-25 DIAGNOSIS — H60399 Other infective otitis externa, unspecified ear: Secondary | ICD-10-CM

## 2013-04-25 MED ORDER — OFLOXACIN 0.3 % OT SOLN: 5.0000 [drp] | Freq: Two times a day (BID) | OTIC | Status: DC

## 2013-04-25 MED ORDER — AZITHROMYCIN 250 MG PO TABS: ORAL_TABLET | ORAL | Status: DC

## 2013-04-25 NOTE — Progress Notes (Signed)
  Subjective:    Patient ID: Bryan Gilbert, male    DOB: [birth year], 47 y.o.   MRN: [id number]  HPI Patient arrives with right ear pain since Sunday.  Pain sharp at times.  No recent cough congestion drainage.  Does use a earplugs for sleeping.   Review of Systems No vomiting no diarrhea no rash. Positive chronic pain. On strong analgesics regularly.     Objective:   Physical Exam Alert no major distress. Right external canal inflamed. Some preauricular node tenderness. Pharynx normal neck supple lungs clear heart rare rhythm.       Assessment & Plan:  Impression external otitis with lymphadenitis. Plan antibiotic eardrops prescribed. Floxin. Z-Pak. Local measures discussed. Avoid earplugs for now. Plan followup as needed. 15 minutes spent most in discussion. WSL

## 2013-04-28 ENCOUNTER — Other Ambulatory Visit: Payer: Self-pay | Admitting: Family Medicine

## 2013-05-03 ENCOUNTER — Encounter: Payer: Self-pay | Admitting: Family Medicine

## 2013-05-18 ENCOUNTER — Telehealth: Payer: Self-pay | Admitting: Family Medicine

## 2013-05-18 NOTE — Telephone Encounter (Signed)
See result of pulse oximetry

## 2013-05-19 ENCOUNTER — Encounter (HOSPITAL_COMMUNITY): Payer: Self-pay | Admitting: Emergency Medicine

## 2013-05-19 ENCOUNTER — Emergency Department (HOSPITAL_COMMUNITY): Payer: Managed Care, Other (non HMO)

## 2013-05-19 ENCOUNTER — Emergency Department (HOSPITAL_COMMUNITY)
Admission: EM | Admit: 2013-05-19 | Discharge: 2013-05-19 | Disposition: A | Discharge status: Home / Self Care | Payer: Managed Care, Other (non HMO) | Attending: Emergency Medicine | Admitting: Emergency Medicine

## 2013-05-19 DIAGNOSIS — I10 Essential (primary) hypertension: Secondary | ICD-10-CM | POA: Insufficient documentation

## 2013-05-19 DIAGNOSIS — M542 Cervicalgia: Secondary | ICD-10-CM | POA: Insufficient documentation

## 2013-05-19 DIAGNOSIS — Z79899 Other long term (current) drug therapy: Secondary | ICD-10-CM | POA: Insufficient documentation

## 2013-05-19 DIAGNOSIS — Z7982 Long term (current) use of aspirin: Secondary | ICD-10-CM | POA: Insufficient documentation

## 2013-05-19 DIAGNOSIS — Z862 Personal history of diseases of the blood and blood-forming organs and certain disorders involving the immune mechanism: Secondary | ICD-10-CM | POA: Insufficient documentation

## 2013-05-19 DIAGNOSIS — R197 Diarrhea, unspecified: Secondary | ICD-10-CM | POA: Insufficient documentation

## 2013-05-19 DIAGNOSIS — R1013 Epigastric pain: Secondary | ICD-10-CM | POA: Insufficient documentation

## 2013-05-19 DIAGNOSIS — G8929 Other chronic pain: Secondary | ICD-10-CM | POA: Insufficient documentation

## 2013-05-19 DIAGNOSIS — R1115 Cyclical vomiting syndrome unrelated to migraine: Secondary | ICD-10-CM

## 2013-05-19 LAB — CBC WITH DIFFERENTIAL/PLATELET
Basophils Absolute: 0 10*3/uL (ref 0.0–0.1)
Basophils Relative: 0 % (ref 0–1)
Eosinophils Absolute: 0 10*3/uL (ref 0.0–0.7)
Eosinophils Relative: 0 % (ref 0–5)
HCT: 43.7 % (ref 39.0–52.0)
Hemoglobin: 15.2 g/dL (ref 13.0–17.0)
Lymphocytes Relative: 16 % (ref 12–46)
Lymphs Abs: 1.9 10*3/uL (ref 0.7–4.0)
MCH: 30.2 pg (ref 26.0–34.0)
MCHC: 34.8 g/dL (ref 30.0–36.0)
MCV: 86.7 fL (ref 78.0–100.0)
Monocytes Absolute: 0.6 10*3/uL (ref 0.1–1.0)
Monocytes Relative: 5 % (ref 3–12)
Neutro Abs: 9.4 10*3/uL — ABNORMAL HIGH (ref 1.7–7.7)
Neutrophils Relative %: 79 % — ABNORMAL HIGH (ref 43–77)
Platelets: 388 10*3/uL (ref 150–400)
RBC: 5.04 MIL/uL (ref 4.22–5.81)
RDW: 12.1 % (ref 11.5–15.5)
WBC: 11.9 10*3/uL — ABNORMAL HIGH (ref 4.0–10.5)

## 2013-05-19 LAB — COMPREHENSIVE METABOLIC PANEL
ALT: 56 U/L — ABNORMAL HIGH (ref 0–53)
AST: 27 U/L (ref 0–37)
Albumin: 4.5 g/dL (ref 3.5–5.2)
Alkaline Phosphatase: 70 U/L (ref 39–117)
BUN: 14 mg/dL (ref 6–23)
CO2: 25 mEq/L (ref 19–32)
Calcium: 10.3 mg/dL (ref 8.4–10.5)
Chloride: 101 mEq/L (ref 96–112)
Creatinine, Ser: 0.98 mg/dL (ref 0.50–1.35)
GFR calc Af Amer: >90 mL/min (ref >90)
GFR calc non Af Amer: >90 mL/min (ref >90)
Glucose, Bld: 162 mg/dL — ABNORMAL HIGH (ref 70–99)
Potassium: 3.6 mEq/L (ref 3.5–5.1)
Sodium: 142 mEq/L (ref 135–145)
Total Bilirubin: 0.6 mg/dL (ref 0.3–1.2)
Total Protein: 8.2 g/dL (ref 6.0–8.3)

## 2013-05-19 LAB — URINALYSIS, ROUTINE W REFLEX MICROSCOPIC
Bilirubin Urine: NEGATIVE
Glucose, UA: NEGATIVE mg/dL
Hgb urine dipstick: NEGATIVE
Leukocytes, UA: NEGATIVE
Nitrite: NEGATIVE
Protein, ur: NEGATIVE mg/dL
Specific Gravity, Urine: 1.025 (ref 1.005–1.030)
Urobilinogen, UA: 0.2 mg/dL (ref 0.0–1.0)
pH: 6.5 (ref 5.0–8.0)

## 2013-05-19 LAB — LIPASE, BLOOD: Lipase: 19 U/L (ref 11–59)

## 2013-05-19 MED ORDER — ONDANSETRON HCL 4 MG/2ML IJ SOLN
4.0000 mg | Freq: Once | INTRAMUSCULAR | Status: AC
  Administered 2013-05-19: 4 mg via INTRAVENOUS
  Filled 2013-05-19: qty 2

## 2013-05-19 MED ORDER — HYDROMORPHONE HCL PF 2 MG/ML IJ SOLN
INTRAMUSCULAR | Status: AC
  Administered 2013-05-19: 2 mg
  Filled 2013-05-19: qty 1

## 2013-05-19 MED ORDER — ONDANSETRON HCL 4 MG/2ML IJ SOLN: 4.0000 mg | Freq: Once | INTRAMUSCULAR | Status: DC

## 2013-05-19 MED ORDER — HYDROMORPHONE HCL PF 2 MG/ML IJ SOLN
2.0000 mg | Freq: Once | INTRAMUSCULAR | Status: AC
  Administered 2013-05-19: 2 mg via INTRAVENOUS
  Filled 2013-05-19: qty 1

## 2013-05-19 MED ORDER — GI COCKTAIL ~~LOC~~: 30.0000 mL | Freq: Once | ORAL | Status: DC

## 2013-05-19 MED ORDER — SODIUM CHLORIDE 0.9 % IV BOLUS (SEPSIS)
1000.0000 mL | Freq: Once | INTRAVENOUS | Status: AC
  Administered 2013-05-19: 1000 mL via INTRAVENOUS

## 2013-05-19 MED ORDER — LORAZEPAM 2 MG/ML IJ SOLN
1.0000 mg | Freq: Once | INTRAMUSCULAR | Status: AC
  Administered 2013-05-19: 1 mg via INTRAVENOUS
  Filled 2013-05-19: qty 1

## 2013-05-19 MED ORDER — PROMETHAZINE HCL 25 MG/ML IJ SOLN
25.0000 mg | Freq: Once | INTRAMUSCULAR | Status: AC
  Administered 2013-05-19: 25 mg via INTRAVENOUS
  Filled 2013-05-19: qty 1

## 2013-05-19 MED ORDER — GI COCKTAIL ~~LOC~~
ORAL | Status: AC
  Administered 2013-05-19: 30 mL
  Filled 2013-05-19: qty 30

## 2013-05-19 MED ORDER — ONDANSETRON HCL 4 MG/2ML IJ SOLN
4.0000 mg | Freq: Once | INTRAMUSCULAR | Status: AC
  Administered 2013-05-19: 4 mg via INTRAVENOUS

## 2013-05-19 MED ORDER — ONDANSETRON HCL 4 MG/2ML IJ SOLN
INTRAMUSCULAR | Status: AC
  Filled 2013-05-19: qty 2

## 2013-05-19 NOTE — ED Notes (Signed)
Abdominal pain,  Nausea and vomiting since 7 am.  Has episodes of this.  Wife calls the episodes abdominal migraine and cyclic vomiting.  " this has been going on for 2 weeks,  This episode is worse this morning".

## 2013-05-19 NOTE — ED Provider Notes (Signed)
CSN: [id number]     Arrival date & time 05/19/13  1118 History  This chart was scribed for Glynn Octave, MD by Bennett Scrape, ED Scribe. This patient was seen in room APA11/APA11 and the patient's care was started at 1:17 PM.   Chief Complaint  Patient presents with  . Abdominal Pain  . Emesis    The history is provided by the patient. No language interpreter was used.    HPI Comments: Bryan Gilbert is a 47 y.o. male with a h/o cyclic vomiting for the past 13 years who presents to the Emergency Department complaining of 2 weeks of intermittent epigastric abdominal pain described as cramping with intermittent nausea, emesis and dirrahea. Last episode of emesis was 5 minutes ago.  Wife states that the pt has been taking Stadol and injectable Imitrex for "abdominal migraines" prescribed by a [facility]ist in September at home with improvement but reports that the symptyoms became uncontrolled this morning. He has tried "everthing including warm baths" at home as well with no improvement. Wife states that the pt has been thoroughly worked up by [employer] before with prior CT scans and endoscopies. He denies any differentiation from his normal flare ups. He states that he has been unable to eat due to eating worsening his symptoms and that sitting up hunched over mildly improves his symptoms. He denies any prior abdominal surgeries or alcohol use. Last episode this severe occurred months ago. He has prior admissions for the same at [facility] and [facility]. He denies any other symptoms. He is also currently taking Hydrocodone for chronic neck pain and his history is significant for HTN as well.  [facility]  Past Medical History  Diagnosis Date  . Cyclical vomiting   . Hypertension   . Slipped intervertebral disc   . Sleep apnea     states uses O2 and CPAP at night  . Cervical neuralgia   . Elevated hemoglobin    Past Surgical History  Procedure Laterality Date  .  Esophagogastroduodenoscopy     Family History  Problem Relation Age of Onset  . Heart failure Father   . Hypertension Father    History  Substance Use Topics  . Smoking status: Never Smoker   . Smokeless tobacco: Never Used  . Alcohol Use: No    Review of Systems  A complete 10 system review of systems was obtained and all systems are negative except as noted in the HPI and PMH.   Allergies  Lipitor  Home Medications   Current Outpatient Rx  Name  Route  Sig  Dispense  Refill  . aspirin EC 81 MG tablet   Oral   Take 81 mg by mouth at bedtime.          . cyclobenzaprine (FLEXERIL) 10 MG tablet   Oral   Take 1 tablet (10 mg total) by mouth 3 (three) times daily as needed. For muscle relaxation   90 tablet   5   . diazepam (VALIUM) 5 MG tablet      TAKE 1 TABLET EVERY 6 HOURS AS NEEDED   22 tablet   2   . HYDROcodone-acetaminophen (NORCO) 10-325 MG Bryan tablet      TAKE 1 TABLET BY MOUTH EVERY 4 HOURS AS NEEDED FOR PAIN   180 tablet   0   . lisinopril-hydrochlorothiazide (PRINZIDE,ZESTORETIC) 20-12.5 MG Bryan tablet   Oral   Take 1 tablet by mouth at bedtime.   30 tablet   5   .  pantoprazole (PROTONIX) 40 MG tablet      TAKE 1 TABLET BY MOUTH EVERY DAY   90 tablet   1   . Probiotic Product (PHILLIPS COLON HEALTH PO)   Oral   Take 1 capsule by mouth at bedtime. One cap daily         . zolpidem (AMBIEN) 5 MG tablet   Oral   Take 5 mg by mouth at bedtime as needed for sleep.         Marland Kitchen ondansetron (ZOFRAN) 8 MG tablet   Oral   Take 1 tablet (8 mg total) by mouth every 8 (eight) hours as needed for nausea.   20 tablet   6   . traMADol (ULTRAM) 50 MG tablet   Oral   Take 1 tablet (50 mg total) by mouth 4 (four) times daily as needed for pain.   30 tablet   1    Triage Vitals: BP 132/97  Pulse 90  Temp(Src) 98.2 F (36.8 C)  Resp 18  Ht 5\' 7"  (1.702 m)  Wt 200 lb (90.719 kg)  BMI 31.32 kg/m2  SpO2 99%  Physical Exam  Nursing note and  vitals reviewed. Constitutional: He is oriented to person, place, and time. He appears well-developed and well-nourished. No distress.  Uncomfortable appearing. Sitting up clutching abdomen  HENT:  Head: Normocephalic and atraumatic.  Mouth/Throat: Oropharynx is clear and moist.  Eyes: Conjunctivae and EOM are normal. Pupils are equal, round, and reactive to light.  Neck: Neck supple. No tracheal deviation present.  Cardiovascular: Normal rate and regular rhythm.   Pulmonary/Chest: Effort normal and breath sounds normal. No respiratory distress.  Abdominal: Soft. There is tenderness (epigastric tenderness). There is no rebound and no guarding.  No CVA tenderness   Musculoskeletal: Normal range of motion.  Neurological: He is alert and oriented to person, place, and time.  Skin: Skin is warm and dry.  Psychiatric: He has a normal mood and affect. His behavior is normal.    ED Course  Procedures (including critical care time)  Medications  ondansetron (ZOFRAN) 4 MG/2ML injection (  Not Given 05/19/13 1220)  gi cocktail (Maalox,Lidocaine,Donnatal) (30 mLs Oral Not Given 05/19/13 1658)  ondansetron (ZOFRAN) injection 4 mg (4 mg Intravenous Given 05/19/13 1216)  sodium chloride 0.9 % bolus 1,000 mL (0 mLs Intravenous Stopped 05/19/13 1419)  ondansetron (ZOFRAN) injection 4 mg (4 mg Intravenous Given 05/19/13 1330)  promethazine (PHENERGAN) injection 25 mg (25 mg Intravenous Given 05/19/13 1330)  HYDROmorphone (DILAUDID) injection 2 mg (2 mg Intravenous Given 05/19/13 1330)  HYDROmorphone (DILAUDID) 2 MG/ML injection (2 mg  Given 05/19/13 1418)  HYDROmorphone (DILAUDID) injection 2 mg (2 mg Intravenous Given 05/19/13 1505)  promethazine (PHENERGAN) injection 25 mg (25 mg Intravenous Given 05/19/13 1505)  gi cocktail suspension (30 mLs  Given 05/19/13 1506)  HYDROmorphone (DILAUDID) injection 2 mg (2 mg Intravenous Given 05/19/13 1654)  LORazepam (ATIVAN) injection 1 mg (1 mg Intravenous  Given 05/19/13 1658)    DIAGNOSTIC STUDIES: Oxygen Saturation is 99% on room air, normal by my interpretation.    COORDINATION OF CARE: 1:08 PM-Discussed treatment plan which includes medications, CBC panel, CMP and UA with pt at bedside and pt agreed to plan.   2:50 PM-Pt rechecked and feels improved. He appears more comfortable currently, sitting up and talking more. Informed pt of labs appearing the same as before during prior visits. He states that he still feels pain and nausea. Will reorder more medications.   Labs Review  Labs Reviewed  CBC WITH DIFFERENTIAL - Abnormal; Notable for the following:    WBC 11.9 (*)    Neutrophils Relative % 79 (*)    Neutro Abs 9.4 (*)    All other components within normal limits  COMPREHENSIVE METABOLIC PANEL - Abnormal; Notable for the following:    Glucose, Bld 162 (*)    ALT 56 (*)    All other components within normal limits  URINALYSIS, ROUTINE W REFLEX MICROSCOPIC - Abnormal; Notable for the following:    Ketones, ur TRACE (*)    All other components within normal limits  LIPASE, BLOOD   Imaging Review Dg Abd Acute W/chest  05/19/2013   CLINICAL DATA:  Abdominal pain.  EXAM: ACUTE ABDOMEN SERIES (ABDOMEN 2 VIEW & CHEST 1 VIEW)  COMPARISON:  None.  FINDINGS: There is no evidence of dilated bowel loops or free intraperitoneal air. No radiopaque calculi or other significant radiographic abnormality is seen.  Heart size and mediastinal contours are within normal limits. Both lungs are clear.  IMPRESSION: Negative abdominal radiographs.  No acute cardiopulmonary disease.   Electronically Signed   By: Elige Ko   On: 05/19/2013 15:54    EKG Interpretation   None       MDM   1. Cyclic vomiting syndrome    History of "abdominal migraines" and cyclic vomiting syndrome presenting with typical exacerbation for the past 2 weeks. Associated with nausea and vomiting.  Abdomen soft without peritoneal signs. Labs at baseline. Patient has  required multiple doses of analgesics and antiemetics. He is tolerating by mouth without any vomiting.  Extensive discussion with patient and wife. He still feels a "twinge of pain". Has not had any vomiting in the ED. He is concerned that his pain is not relieved completely. He appears comfortable his abdomen does not have any peritoneal signs. He wishes to try going home and feels like she'll be able to tolerate it. He has his pain and nausea medications at home. He is stable for followup with his specialists.  I personally performed the services described in this documentation, which was scribed in my presence. The recorded information has been reviewed and is accurate.    Glynn Octave, MD 05/19/13 [phone]

## 2013-05-19 NOTE — ED Notes (Signed)
Pt states he is having severe abdominal pain.  Vomiting .  Pt pale and diaphoretic.  Notified dr. Fayrene Fearing.  Orders for ivf and zofran.

## 2013-05-21 ENCOUNTER — Emergency Department (HOSPITAL_COMMUNITY)
Admission: EM | Admit: 2013-05-21 | Discharge: 2013-05-21 | Discharge status: Against Medical Advice | Payer: Managed Care, Other (non HMO) | Attending: Emergency Medicine | Admitting: Emergency Medicine

## 2013-05-21 ENCOUNTER — Encounter (HOSPITAL_COMMUNITY): Payer: Self-pay | Admitting: Emergency Medicine

## 2013-05-21 DIAGNOSIS — Z7982 Long term (current) use of aspirin: Secondary | ICD-10-CM | POA: Insufficient documentation

## 2013-05-21 DIAGNOSIS — Z79899 Other long term (current) drug therapy: Secondary | ICD-10-CM | POA: Insufficient documentation

## 2013-05-21 DIAGNOSIS — R1013 Epigastric pain: Secondary | ICD-10-CM | POA: Insufficient documentation

## 2013-05-21 DIAGNOSIS — Z862 Personal history of diseases of the blood and blood-forming organs and certain disorders involving the immune mechanism: Secondary | ICD-10-CM | POA: Insufficient documentation

## 2013-05-21 DIAGNOSIS — R112 Nausea with vomiting, unspecified: Secondary | ICD-10-CM | POA: Insufficient documentation

## 2013-05-21 DIAGNOSIS — R109 Unspecified abdominal pain: Secondary | ICD-10-CM

## 2013-05-21 DIAGNOSIS — R197 Diarrhea, unspecified: Secondary | ICD-10-CM | POA: Insufficient documentation

## 2013-05-21 DIAGNOSIS — G4730 Sleep apnea, unspecified: Secondary | ICD-10-CM | POA: Insufficient documentation

## 2013-05-21 DIAGNOSIS — Z9981 Dependence on supplemental oxygen: Secondary | ICD-10-CM | POA: Insufficient documentation

## 2013-05-21 DIAGNOSIS — I10 Essential (primary) hypertension: Secondary | ICD-10-CM | POA: Insufficient documentation

## 2013-05-21 DIAGNOSIS — IMO0002 Reserved for concepts with insufficient information to code with codable children: Secondary | ICD-10-CM | POA: Insufficient documentation

## 2013-05-21 DIAGNOSIS — R1110 Vomiting, unspecified: Secondary | ICD-10-CM

## 2013-05-21 LAB — POCT I-STAT, CHEM 8
BUN: 14 mg/dL (ref 6–23)
Calcium, Ion: 1.23 mmol/L (ref 1.12–1.23)
Chloride: 104 mEq/L (ref 96–112)
Creatinine, Ser: 1.1 mg/dL (ref 0.50–1.35)
Glucose, Bld: 143 mg/dL — ABNORMAL HIGH (ref 70–99)
HCT: 47 % (ref 39.0–52.0)
Hemoglobin: 16 g/dL (ref 13.0–17.0)
Potassium: 3.8 mEq/L (ref 3.5–5.1)
Sodium: 144 mEq/L (ref 135–145)
TCO2: 25 mmol/L (ref 0–100)

## 2013-05-21 MED ORDER — METOCLOPRAMIDE HCL 5 MG/ML IJ SOLN
10.0000 mg | Freq: Once | INTRAMUSCULAR | Status: AC
  Administered 2013-05-21: 10 mg via INTRAVENOUS
  Filled 2013-05-21: qty 2

## 2013-05-21 MED ORDER — PANTOPRAZOLE SODIUM 40 MG IV SOLR
40.0000 mg | Freq: Once | INTRAVENOUS | Status: AC
  Administered 2013-05-21: 40 mg via INTRAVENOUS
  Filled 2013-05-21: qty 40

## 2013-05-21 MED ORDER — DIPHENHYDRAMINE HCL 50 MG/ML IJ SOLN
25.0000 mg | Freq: Once | INTRAMUSCULAR | Status: AC
  Administered 2013-05-21: 25 mg via INTRAVENOUS
  Filled 2013-05-21: qty 1

## 2013-05-21 MED ORDER — SODIUM CHLORIDE 0.9 % IV BOLUS (SEPSIS)
1000.0000 mL | Freq: Once | INTRAVENOUS | Status: AC
  Administered 2013-05-21: 1000 mL via INTRAVENOUS

## 2013-05-21 NOTE — ED Notes (Signed)
Patient refused requested Dilaudid 2mg  IV by EDP. Patient leaving AMA. Dr Bebe Shaggy aware.

## 2013-05-21 NOTE — ED Notes (Signed)
Pt c/o abd pain and cyclic vomiting x 2 weeks.  Also reports diarrhea.  Reports was evaluated here Friday for same.

## 2013-05-21 NOTE — ED Provider Notes (Signed)
CSN: [id number]     Arrival date & time 05/21/13  1120 History  This chart was scribed for Joya Gaskins, MD by Bennett Scrape, ED Scribe. This patient was seen in room APA03/APA03 and the patient's care was started at 11:32 AM.   Chief Complaint  Patient presents with  . Abdominal Pain    Patient is a 47 y.o. male presenting with abdominal pain. The history is provided by the patient. No language interpreter was used.  Abdominal Pain Pain location:  Epigastric Pain quality comment:  Tightness Pain radiates to:  Does not radiate Onset quality:  Gradual Duration:  4 hours Timing:  Intermittent Progression:  Waxing and waning (during the episodes) Chronicity:  Recurrent Relieved by:  Vomiting Associated symptoms: diarrhea, nausea and vomiting   Associated symptoms: no chest pain, no dysuria, no fever and no shortness of breath     HPI Comments: Bryan Gilbert is a 47 y.o. male with a h/o cyclic vomiting for the past 14 years who presents to the Emergency Department complaining of abdominal pain with associated emesis and diarrhea that has been intermittent for the past 2 weeks. He states that this episode started around 7:30 AM this morning. Pt reports that his episodes start with diarrhea followed by abdominal pain and emesis. The abdominal pain waxes and wanes as a tightening pain that improves with vomiting. He was seen 2 days ago for the same with improvement after several doses of Dilaudid and Phenergan. He states that his last episode before that was in May 2014.  He is currently following up with his GIs with no improvement. He denies any CP or SOB.  Past Medical History  Diagnosis Date  . Cyclical vomiting   . Hypertension   . Slipped intervertebral disc   . Sleep apnea     states uses O2 and CPAP at night  . Cervical neuralgia   . Elevated hemoglobin    Past Surgical History  Procedure Laterality Date  . Esophagogastroduodenoscopy     Family History  Problem  Relation Age of Onset  . Heart failure Father   . Hypertension Father    History  Substance Use Topics  . Smoking status: Never Smoker   . Smokeless tobacco: Never Used  . Alcohol Use: No    Review of Systems  Constitutional: Negative for fever.  Respiratory: Negative for shortness of breath.   Cardiovascular: Negative for chest pain.  Gastrointestinal: Positive for nausea, vomiting, abdominal pain and diarrhea.  Genitourinary: Negative for dysuria.  All other systems reviewed and are negative.    Allergies  Lipitor  Home Medications   Current Outpatient Rx  Name  Route  Sig  Dispense  Refill  . aspirin EC 81 MG tablet   Oral   Take 81 mg by mouth at bedtime.          . cyclobenzaprine (FLEXERIL) 10 MG tablet   Oral   Take 1 tablet (10 mg total) by mouth 3 (three) times daily as needed. For muscle relaxation   90 tablet   5   . diazepam (VALIUM) 5 MG tablet      TAKE 1 TABLET EVERY 6 HOURS AS NEEDED   22 tablet   2   . HYDROcodone-acetaminophen (NORCO) 10-325 MG per tablet      TAKE 1 TABLET BY MOUTH EVERY 4 HOURS AS NEEDED FOR PAIN   180 tablet   0   . lisinopril-hydrochlorothiazide (PRINZIDE,ZESTORETIC) 20-12.5 MG per tablet   Oral  Take 1 tablet by mouth at bedtime.   30 tablet   5   . ondansetron (ZOFRAN) 8 MG tablet   Oral   Take 1 tablet (8 mg total) by mouth every 8 (eight) hours as needed for nausea.   20 tablet   6   . pantoprazole (PROTONIX) 40 MG tablet      TAKE 1 TABLET BY MOUTH EVERY DAY   90 tablet   1   . Probiotic Product (PHILLIPS COLON HEALTH PO)   Oral   Take 1 capsule by mouth at bedtime. One cap daily         . traMADol (ULTRAM) 50 MG tablet   Oral   Take 1 tablet (50 mg total) by mouth 4 (four) times daily as needed for pain.   30 tablet   1   . zolpidem (AMBIEN) 5 MG tablet   Oral   Take 5 mg by mouth at bedtime as needed for sleep.          Triage Vitals: BP 134/81  Pulse 93  Temp(Src) 98.5 F (36.9  C) (Oral)  Resp 18  SpO2 99%  Physical Exam  Nursing note and vitals reviewed.  CONSTITUTIONAL: Well developed/well nourished, huddled over emesis basin HEAD: Normocephalic/atraumatic EYES: EOMI/PERRL ENMT: Mucous membranes dry NECK: supple no meningeal signs SPINE:entire spine nontender CV: S1/S2 noted, no murmurs/rubs/gallops noted LUNGS: Lungs are clear to auscultation bilaterally, no apparent distress ABDOMEN: soft, nontender, no rebound or guarding GU:no cva tenderness NEURO: Pt is awake/alert, moves all extremitiesx4 EXTREMITIES: pulses normal, full ROM SKIN: warm, color normal PSYCH: mildly anxious   ED Course  Procedures (including critical care time)  DIAGNOSTIC STUDIES: Oxygen Saturation is 99% on room air, normal by my interpretation.    COORDINATION OF CARE: 11:34 AM-Discussed treatment plan which includes chem 8 with pt at bedside and pt agreed to plan.   12:12 PM Pt requesting dilaudid I advised patient that attempting non-narcotic methods to alleviate his vomiting is recommended I reviewed his chart from a [facility] visit and he is seen for cyclic vomiting syndrome   He reported that if we are not giving dilaudid, he would like to have IV removed and he will drive to [facility]  Pt is awake/alert, no distress, not actively vomiting on my repeat eval.  His labs were reassuring.  His abdomen was benign, I doubt acute abdominal or acute cardiopulmonary process.  Stable for d/c  EKG Interpretation   None       MDM  No diagnosis found. Nursing notes including past medical history and social history reviewed and considered in documentation Previous records reviewed and considered Labs/vital reviewed and considered   I personally performed the services described in this documentation, which was scribed in my presence. The recorded information has been reviewed and is accurate.      Joya Gaskins, MD 05/21/13 [phone]

## 2013-05-24 NOTE — Telephone Encounter (Signed)
Informed the patient that his pulse oximetry nighttime test showed that his oxygen level is actually stayed very good for the vast majority of times. One time it did drop to 80%. According to current treatment guidelines/Medicare guidelines/insurance guidelines it does not qualify for ongoing nighttime oxygen. There are 2 choices. Choice 1-stop the nighttime oxygen. Choice #2 repeat the test to see if he would qualify on the second try. Please talk with the patient to find out what he should so choose (it would be best to speak with the patient rather than his wife unless the patient directly gives permission to do so)

## 2013-05-25 NOTE — Telephone Encounter (Signed)
Spoke with patient regarding his pulse oximetry nighttime test showed that his oxygen level is actually stayed very good for the vast majority of times. One time it did drop to 80%. According to current treatment guidelines/Medicare guidelines/insurance guidelines it does not qualify for ongoing nighttime oxygen. There are 2 choices. Choice 1-stop the nighttime oxygen. Choice #2 repeat the test to see if he would qualify on the second try.  Pt verbalized understanding.    I talked with the patient to find out what he should so choose, and he said he was going to speak with his wife and get back with [place].

## 2013-05-25 NOTE — Telephone Encounter (Signed)
Spoke with patient regarding his pulse oximetry nighttime test showed that his oxygen level is actually stayed very good for the vast majority of times. One time it did drop to 80%. According to current treatment guidelines/Medicare guidelines/insurance guidelines it does not qualify for ongoing nighttime oxygen. There are 2 choices. Choice 1-stop the nighttime oxygen. Choice #2 repeat the test to see if he would qualify on the second try.  Pt verbalized understanding.    I talked with the patient to find out what he should so choose, and he said he was going to speak with his wife and get back with us. 

## 2013-05-25 NOTE — Telephone Encounter (Signed)
Left message on voicemail to return call.

## 2013-05-27 ENCOUNTER — Encounter (HOSPITAL_COMMUNITY): Payer: Self-pay | Admitting: Emergency Medicine

## 2013-05-27 ENCOUNTER — Emergency Department (HOSPITAL_COMMUNITY)
Admission: EM | Admit: 2013-05-27 | Discharge: 2013-05-27 | Disposition: A | Discharge status: Home / Self Care | Payer: Managed Care, Other (non HMO) | Attending: Emergency Medicine | Admitting: Emergency Medicine

## 2013-05-27 DIAGNOSIS — R1110 Vomiting, unspecified: Secondary | ICD-10-CM

## 2013-05-27 DIAGNOSIS — Z862 Personal history of diseases of the blood and blood-forming organs and certain disorders involving the immune mechanism: Secondary | ICD-10-CM | POA: Insufficient documentation

## 2013-05-27 DIAGNOSIS — R109 Unspecified abdominal pain: Secondary | ICD-10-CM | POA: Insufficient documentation

## 2013-05-27 DIAGNOSIS — Z7982 Long term (current) use of aspirin: Secondary | ICD-10-CM | POA: Insufficient documentation

## 2013-05-27 DIAGNOSIS — G43809 Other migraine, not intractable, without status migrainosus: Secondary | ICD-10-CM | POA: Insufficient documentation

## 2013-05-27 DIAGNOSIS — Z79899 Other long term (current) drug therapy: Secondary | ICD-10-CM | POA: Insufficient documentation

## 2013-05-27 DIAGNOSIS — G4730 Sleep apnea, unspecified: Secondary | ICD-10-CM | POA: Insufficient documentation

## 2013-05-27 DIAGNOSIS — Z8739 Personal history of other diseases of the musculoskeletal system and connective tissue: Secondary | ICD-10-CM | POA: Insufficient documentation

## 2013-05-27 DIAGNOSIS — I10 Essential (primary) hypertension: Secondary | ICD-10-CM | POA: Insufficient documentation

## 2013-05-27 MED ORDER — GI COCKTAIL ~~LOC~~
30.0000 mL | Freq: Once | ORAL | Status: AC
  Filled 2013-05-27: qty 30

## 2013-05-27 MED ORDER — HYDROMORPHONE HCL PF 2 MG/ML IJ SOLN
2.0000 mg | Freq: Once | INTRAMUSCULAR | Status: AC
  Administered 2013-05-27: 2 mg via INTRAVENOUS
  Filled 2013-05-27: qty 1

## 2013-05-27 MED ORDER — HYDROMORPHONE HCL PF 2 MG/ML IJ SOLN
2.0000 mg | Freq: Once | INTRAMUSCULAR | Status: AC
  Administered 2013-05-27: 2 mg via INTRAVENOUS

## 2013-05-27 MED ORDER — HYDROMORPHONE HCL PF 2 MG/ML IJ SOLN
INTRAMUSCULAR | Status: AC
  Filled 2013-05-27: qty 1

## 2013-05-27 MED ORDER — GI COCKTAIL ~~LOC~~
30.0000 mL | Freq: Once | ORAL | Status: AC
  Administered 2013-05-27: 30 mL via ORAL

## 2013-05-27 MED ORDER — HYDROMORPHONE HCL PF 2 MG/ML IJ SOLN
INTRAMUSCULAR | Status: AC
  Administered 2013-05-27: 2 mg via INTRAVENOUS
  Filled 2013-05-27: qty 1

## 2013-05-27 MED ORDER — PROMETHAZINE HCL 25 MG/ML IJ SOLN
25.0000 mg | Freq: Once | INTRAMUSCULAR | Status: AC
  Administered 2013-05-27: 25 mg via INTRAVENOUS

## 2013-05-27 MED ORDER — SODIUM CHLORIDE 0.9 % IV BOLUS (SEPSIS)
1000.0000 mL | Freq: Once | INTRAVENOUS | Status: AC
  Administered 2013-05-27: 1000 mL via INTRAVENOUS

## 2013-05-27 MED ORDER — PROMETHAZINE HCL 25 MG/ML IJ SOLN
INTRAMUSCULAR | Status: AC
  Administered 2013-05-27: 25 mg via INTRAVENOUS
  Filled 2013-05-27: qty 1

## 2013-05-27 NOTE — ED Notes (Signed)
Patient with no complaints at this time. Respirations even and unlabored. Skin warm/dry. Discharge instructions reviewed with patient at this time. Patient given opportunity to voice concerns/ask questions. IV removed per policy and band-aid applied to site. Patient discharged at this time and left Emergency Department with steady gait.  

## 2013-05-27 NOTE — ED Provider Notes (Addendum)
CSN: [id number]     Arrival date & time 05/27/13  1048 History  This chart was scribed for Benny Lennert, MD,  by [name] Jacobs, ED Scribe. The patient was seen in room APA07/APA07 and the patient's care was started at 11:02 AM   First MD Initiated Contact with Patient 05/27/13 1101     Chief Complaint  Patient presents with  . Emesis   (Consider location/radiation/quality/duration/timing/severity/associated sxs/prior Treatment) Patient is a 47 y.o. male presenting with vomiting. The history is provided by the patient and medical records. No language interpreter was used.  Emesis Severity:  Severe Duration:  2 days Timing:  Constant Quality:  Undigested food Able to tolerate:  Liquids Progression:  Unchanged Chronicity:  Chronic Relieved by:  Nothing Ineffective treatments:  Antiemetics Associated symptoms: abdominal pain and headaches   Associated symptoms: no diarrhea   Risk factors: no alcohol use    HPI Comments: Bryan Gilbert is a 47 y.o. male with hx of frequent abdominal migraines who presents to the Emergency Department complaining of severe, constant emesis ('cyclic vomiting") for the past three weeks. Pt had an injection before eating breakfast and the next day he had severe migraines. After the emesis episodes he experiences severe abdominal pain.  He visited the ED three times in the past week. Yesterday he visited [facility]'s ED for the same migraine and was given pain medications. Pt's relative states Zofran is "a waste of meds". Pt has a hx of HTN, slipped intervertebral disc, sleep apnea, cervical neuralgia and elevated hemoglobin. He does not smoke tobacco or drink alcohol.  Past Medical History  Diagnosis Date  . Cyclical vomiting   . Hypertension   . Slipped intervertebral disc   . Sleep apnea     states uses O2 and CPAP at night  . Cervical neuralgia   . Elevated hemoglobin    Past Surgical History  Procedure Laterality Date  . Esophagogastroduodenoscopy      Family History  Problem Relation Age of Onset  . Heart failure Father   . Hypertension Father    History  Substance Use Topics  . Smoking status: Never Smoker   . Smokeless tobacco: Never Used  . Alcohol Use: No    Review of Systems  Constitutional: Negative for appetite change and fatigue.  HENT: Negative for congestion, ear discharge and sinus pressure.   Eyes: Negative for discharge.  Respiratory: Negative for cough.   Cardiovascular: Negative for chest pain.  Gastrointestinal: Positive for nausea, vomiting and abdominal pain. Negative for diarrhea.  Genitourinary: Negative for frequency and hematuria.  Musculoskeletal: Negative for back pain.  Skin: Negative for rash.  Neurological: Positive for headaches. Negative for seizures.  Psychiatric/Behavioral: Negative for hallucinations.  All other systems reviewed and are negative.    Allergies  Lipitor  Home Medications   Current Outpatient Rx  Name  Route  Sig  Dispense  Refill  . aspirin EC 81 MG tablet   Oral   Take 81 mg by mouth at bedtime.          . butorphanol (STADOL) 10 MG/ML nasal spray   Nasal   Place 1 spray into the nose every 4 (four) hours as needed for headache.         . cyclobenzaprine (FLEXERIL) 10 MG tablet   Oral   Take 1 tablet (10 mg total) by mouth 3 (three) times daily as needed. For muscle relaxation   90 tablet   5   . diazepam (VALIUM) 5  MG tablet   Oral   Take 5 mg by mouth every 6 (six) hours as needed for anxiety.         Marland Kitchen HYDROcodone-acetaminophen (NORCO) 10-325 MG per tablet   Oral   Take 1 tablet by mouth every 4 (four) hours as needed for moderate pain.         Marland Kitchen lisinopril-hydrochlorothiazide (PRINZIDE,ZESTORETIC) 20-12.5 MG per tablet   Oral   Take 1 tablet by mouth at bedtime.   30 tablet   5   . ondansetron (ZOFRAN) 8 MG tablet   Oral   Take 1 tablet (8 mg total) by mouth every 8 (eight) hours as needed for nausea.   20 tablet   6   .  pantoprazole (PROTONIX) 40 MG tablet   Oral   Take 40 mg by mouth daily.         . Probiotic Product (PHILLIPS COLON HEALTH PO)   Oral   Take 1 capsule by mouth at bedtime.          . traMADol (ULTRAM) 50 MG tablet   Oral   Take 1 tablet (50 mg total) by mouth 4 (four) times daily as needed for pain.   30 tablet   1   . zolpidem (AMBIEN) 5 MG tablet   Oral   Take 5 mg by mouth at bedtime as needed for sleep.          BP 150/97  Pulse 86  Temp(Src) 98.5 F (36.9 C) (Oral)  Resp 20  Ht 5\' 7"  (1.702 m)  Wt 200 lb (90.719 kg)  BMI 31.32 kg/m2  SpO2 98% Physical Exam  Constitutional: He is oriented to person, place, and time. He appears well-developed.  HENT:  Head: Normocephalic.  Eyes: Conjunctivae and EOM are normal. No scleral icterus.  Neck: Neck supple. No thyromegaly present.  Cardiovascular: Normal rate and regular rhythm.  Exam reveals no gallop and no friction rub.   No murmur heard. Pulmonary/Chest: No stridor. He has no wheezes. He has no rales. He exhibits no tenderness.  Abdominal: He exhibits no distension. There is tenderness. There is no rebound.  Musculoskeletal: Normal range of motion. He exhibits no edema.  Lymphadenopathy:    He has no cervical adenopathy.  Neurological: He is oriented to person, place, and time. He exhibits normal muscle tone. Coordination normal.  Skin: No rash noted. No erythema.  Psychiatric: He has a normal mood and affect. His behavior is normal.    ED Course  Procedures (including critical care time) DIAGNOSTIC STUDIES: Oxygen Saturation is 98% on room air, normal by my interpretation.    COORDINATION OF CARE: 11:06 AM Discussed course of care with pt which includes Dilaudid 2mg , Phenergan 25 mg,sodium chloride 0.9 % bolus 1,000 mL. Pt understands and agrees.  Labs Review Labs Reviewed - No data to display Imaging Review No results found.  EKG Interpretation   None       MDM  Vomiting from abd migraine.   Pt to follow up with his md at [facility], MD 05/27/13 1321                The chart was scribed for me under my direct supervision.  I personally performed the history, physical, and medical decision making and all procedures in the evaluation of this patient.Benny Lennert, MD 05/27/13 1322

## 2013-05-27 NOTE — ED Notes (Signed)
Pt states has cyclic vomiting since 0730 this morning

## 2013-05-27 NOTE — ED Notes (Signed)
No vomiting since arrival to ER.  Lying quietly on stretcher w/knees bent up.

## 2013-06-09 ENCOUNTER — Encounter (HOSPITAL_COMMUNITY): Payer: Self-pay | Admitting: Emergency Medicine

## 2013-06-09 ENCOUNTER — Observation Stay (HOSPITAL_COMMUNITY)
Admission: EM | Admit: 2013-06-09 | Discharge: 2013-06-09 | Disposition: A | Discharge status: Home / Self Care | Payer: Managed Care, Other (non HMO) | Attending: Internal Medicine | Admitting: Internal Medicine

## 2013-06-09 DIAGNOSIS — R739 Hyperglycemia, unspecified: Secondary | ICD-10-CM | POA: Diagnosis present

## 2013-06-09 DIAGNOSIS — R1115 Cyclical vomiting syndrome unrelated to migraine: Principal | ICD-10-CM | POA: Diagnosis present

## 2013-06-09 DIAGNOSIS — I10 Essential (primary) hypertension: Secondary | ICD-10-CM | POA: Diagnosis present

## 2013-06-09 DIAGNOSIS — R1110 Vomiting, unspecified: Secondary | ICD-10-CM

## 2013-06-09 DIAGNOSIS — G4733 Obstructive sleep apnea (adult) (pediatric): Secondary | ICD-10-CM | POA: Diagnosis present

## 2013-06-09 DIAGNOSIS — R7309 Other abnormal glucose: Secondary | ICD-10-CM | POA: Insufficient documentation

## 2013-06-09 LAB — POCT I-STAT, CHEM 8
BUN: 20 mg/dL (ref 6–23)
Calcium, Ion: 1.16 mmol/L (ref 1.12–1.23)
Chloride: 104 mEq/L (ref 96–112)
Creatinine, Ser: 1.1 mg/dL (ref 0.50–1.35)
Glucose, Bld: 211 mg/dL — ABNORMAL HIGH (ref 70–99)
HCT: 51 % (ref 39.0–52.0)
Hemoglobin: 17.3 g/dL — ABNORMAL HIGH (ref 13.0–17.0)
Potassium: 3.5 mEq/L (ref 3.5–5.1)
Sodium: 141 mEq/L (ref 135–145)
TCO2: 22 mmol/L (ref 0–100)

## 2013-06-09 LAB — HEPATIC FUNCTION PANEL
ALT: 99 U/L — ABNORMAL HIGH (ref 0–53)
AST: 43 U/L — ABNORMAL HIGH (ref 0–37)
Albumin: 4.5 g/dL (ref 3.5–5.2)
Alkaline Phosphatase: 83 U/L (ref 39–117)
Bilirubin, Direct: 0.1 mg/dL (ref 0.0–0.3)
Indirect Bilirubin: 0.5 mg/dL (ref 0.3–0.9)
Total Bilirubin: 0.6 mg/dL (ref 0.3–1.2)
Total Protein: 8.3 g/dL (ref 6.0–8.3)

## 2013-06-09 LAB — LIPASE, BLOOD: Lipase: 20 U/L (ref 11–59)

## 2013-06-09 MED ORDER — CYCLOBENZAPRINE HCL 10 MG PO TABS: 10.0000 mg | ORAL_TABLET | Freq: Three times a day (TID) | ORAL | Status: DC | PRN

## 2013-06-09 MED ORDER — ACETAMINOPHEN 325 MG PO TABS: 650.0000 mg | ORAL_TABLET | Freq: Four times a day (QID) | ORAL | Status: DC | PRN

## 2013-06-09 MED ORDER — DIPHENHYDRAMINE HCL 50 MG/ML IJ SOLN
25.0000 mg | Freq: Once | INTRAMUSCULAR | Status: AC
  Administered 2013-06-09: 25 mg via INTRAVENOUS
  Filled 2013-06-09: qty 1

## 2013-06-09 MED ORDER — LISINOPRIL 10 MG PO TABS: 20.0000 mg | ORAL_TABLET | Freq: Every day | ORAL | Status: DC

## 2013-06-09 MED ORDER — METOCLOPRAMIDE HCL 5 MG/ML IJ SOLN
10.0000 mg | Freq: Once | INTRAMUSCULAR | Status: AC
  Administered 2013-06-09: 10 mg via INTRAVENOUS
  Filled 2013-06-09: qty 2

## 2013-06-09 MED ORDER — HYDROMORPHONE HCL PF 2 MG/ML IJ SOLN
2.0000 mg | INTRAMUSCULAR | Status: DC | PRN
  Administered 2013-06-09: 2 mg via INTRAVENOUS
  Filled 2013-06-09: qty 1

## 2013-06-09 MED ORDER — HYDROMORPHONE HCL PF 1 MG/ML IJ SOLN
2.0000 mg | INTRAMUSCULAR | Status: DC | PRN
  Administered 2013-06-09: 2 mg via INTRAVENOUS
  Filled 2013-06-09 (×2): qty 2

## 2013-06-09 MED ORDER — PROMETHAZINE HCL 25 MG/ML IJ SOLN: 12.5000 mg | Freq: Four times a day (QID) | INTRAMUSCULAR | Status: DC | PRN

## 2013-06-09 MED ORDER — ASPIRIN EC 81 MG PO TBEC: 81.0000 mg | DELAYED_RELEASE_TABLET | Freq: Every day | ORAL | Status: DC

## 2013-06-09 MED ORDER — PROMETHAZINE HCL 25 MG/ML IJ SOLN
25.0000 mg | Freq: Once | INTRAMUSCULAR | Status: AC
  Administered 2013-06-09: 25 mg via INTRAVENOUS
  Filled 2013-06-09: qty 1

## 2013-06-09 MED ORDER — SODIUM CHLORIDE 0.9 % IV SOLN
INTRAVENOUS | Status: DC
  Administered 2013-06-09: 100 mL/h via INTRAVENOUS

## 2013-06-09 MED ORDER — HYDROMORPHONE HCL PF 2 MG/ML IJ SOLN
2.0000 mg | INTRAMUSCULAR | Status: AC
  Administered 2013-06-09: 2 mg via INTRAVENOUS
  Filled 2013-06-09: qty 1

## 2013-06-09 MED ORDER — PANTOPRAZOLE SODIUM 40 MG PO TBEC
40.0000 mg | DELAYED_RELEASE_TABLET | Freq: Every day | ORAL | Status: DC
  Administered 2013-06-09: 40 mg via ORAL
  Filled 2013-06-09: qty 1

## 2013-06-09 MED ORDER — ZOLPIDEM TARTRATE 5 MG PO TABS: 5.0000 mg | ORAL_TABLET | Freq: Every evening | ORAL | Status: DC | PRN

## 2013-06-09 MED ORDER — KETOROLAC TROMETHAMINE 30 MG/ML IJ SOLN
30.0000 mg | Freq: Once | INTRAMUSCULAR | Status: AC
  Administered 2013-06-09: 30 mg via INTRAVENOUS
  Filled 2013-06-09: qty 1

## 2013-06-09 MED ORDER — ACETAMINOPHEN 650 MG RE SUPP: 650.0000 mg | Freq: Four times a day (QID) | RECTAL | Status: DC | PRN

## 2013-06-09 MED ORDER — GI COCKTAIL ~~LOC~~
30.0000 mL | Freq: Four times a day (QID) | ORAL | Status: DC | PRN
  Administered 2013-06-09: 30 mL via ORAL
  Filled 2013-06-09: qty 30

## 2013-06-09 MED ORDER — SODIUM CHLORIDE 0.9 % IV BOLUS (SEPSIS)
1000.0000 mL | Freq: Once | INTRAVENOUS | Status: AC
  Administered 2013-06-09: 500 mL via INTRAVENOUS

## 2013-06-09 MED ORDER — HYDROCHLOROTHIAZIDE 12.5 MG PO CAPS: 12.5000 mg | ORAL_CAPSULE | Freq: Every day | ORAL | Status: DC

## 2013-06-09 MED ORDER — HYDROMORPHONE HCL PF 1 MG/ML IJ SOLN
2.0000 mg | Freq: Once | INTRAMUSCULAR | Status: AC
  Administered 2013-06-09: 2 mg via INTRAVENOUS

## 2013-06-09 MED ORDER — BUTORPHANOL TARTRATE 10 MG/ML NA SOLN: 1.0000 | NASAL | Status: DC | PRN

## 2013-06-09 MED ORDER — PANTOPRAZOLE SODIUM 40 MG IV SOLR
40.0000 mg | Freq: Once | INTRAVENOUS | Status: AC
  Administered 2013-06-09: 40 mg via INTRAVENOUS
  Filled 2013-06-09: qty 40

## 2013-06-09 MED ORDER — DIAZEPAM 5 MG PO TABS: 5.0000 mg | ORAL_TABLET | Freq: Four times a day (QID) | ORAL | Status: DC | PRN

## 2013-06-09 MED ORDER — LISINOPRIL-HYDROCHLOROTHIAZIDE 20-12.5 MG PO TABS: 1.0000 | ORAL_TABLET | Freq: Every day | ORAL | Status: DC

## 2013-06-09 NOTE — Discharge Summary (Addendum)
Physician Discharge Summary  Bryan Gilbert YNW:[id number] DOB: 02/25/1966 DOA: 06/09/2013  PCP: Lilyan Punt, MD  Admit date: 06/09/2013 Discharge date: 06/09/2013  Time spent: 70 minutes,greater than 50% of this time was spent counseling the patient and his wife.   Recommendations for Outpatient Follow-up:  Follow up with neurologist at [facility] as previously scheduled Follow up with primary care doctor in 2 weeks  Discharge Diagnoses:  Principal Problem:   Cyclic vomiting syndrome Active Problems:   HTN (hypertension)   Hyperglycemia   Obstructive sleep apnea   Cyclical vomiting syndrome   Discharge Condition: improved  Diet recommendation: clear liquids, advance as tolerated  Filed Weights   06/09/13 0552 06/09/13 1113  Weight: 90.719 kg (200 lb) 93.4 kg (205 lb 14.6 oz)    History of present illness:  Bryan Gilbert is a 47 y.o. male with history of chronic epigastric pain with associated vomiting. His wife reports that the patient has had these symptoms for approximately 13 years. He had extensive workup done with gastroenterology without any significant findings to explain his symptoms. More recently, he was referred to [facility] neurology and was diagnosed with abdominal migraine/cyclic vomiting syndrome. Has been on multiple drug regimens to treat his symptoms. He has tried nortriptyline without any significant improvement and this was subsequently discontinued. He reports having tried benzodiazepines in the past with no significant improvement. Regarding pain management, he was tried on morphine in the past without improvement. The only medicines he reports a somewhat improved his symptoms have been Nubain as well as hydromorphone. Review of records indicate that patient was previously taking oral hydromorphone prescribed by his primary care physician which helped control his symptoms. More recently, his wife reports that Dilaudid was discontinued since the patient had  difficulty "absorbing this medicine". He has been started on intranasal Stadol with some improvement of his symptoms. Typically, they report that his abdominal migraine can last approximately one to 2 weeks. During this time period, he intermittently develops severe epigastric pain with continuous vomiting. Intranasal Stadol occasionally does relieve his symptoms, but when this does not improve, then he reports to the emergency room for evaluation. He feels his symptoms at this time are very typical of abdominal migraine. Initially, the patient experiences diarrhea, this is followed by intense epigastric pain followed by persistent vomiting. He's not had any fever, has not been eating out, no sick contacts. No melena or hematochezia, no dysuria, shortness of breath chest pain or cough. Review of records from [facility] indicates that his neurologist has recommended that patient receive intravenous Dilaudid to help manage his acute attacks. The patient has received several antiemetics in the emergency room without relief. He will be admitted to the hospital for further management.   Hospital Course:  Patient was admitted to the hospital earlier this morning for abdominal pain and vomiting, felt to be due to abdominal migraine/cyclical vomiting syndrome. Please see history and physical from earlier today for further details. Since his admission to the medical floor, patient received several doses of intravenous hydromorphone to help control his pain. Once his pain was controlled, the patient reported that he tolerated clear liquids. He felt that his pain was now manageable and he was requesting discharge home. I offered to keep the patient in the hospital for continued monitoring for at least 24 hours, but the patient felt comfortable discharging home. He reports that he would keep himself on liquids and slowly advance his diet at home as tolerated. He reports that these episodes  of vomiting and abdominal  pain are frequent and chronic. He has become comfortable trying to manage it at home once his pain is reasonably controlled. He reports improvement of his pain with intranasal Stadol as well, which the patient does have at home now. At the time of discharge, patient was awake, alert, did not appear to be in any pain or distress, was conversing without any difficulty and was ambulating without any difficulty. He appears to be markedly improved since admission and therefore was discharged home. He will followup with his neurologist at [facility] for further management of his abdominal migraines. Both the patient and his wife were somewhat frustrated regarding the care they received on arrival. I spent over an hour with the patient and his wife, listening to their concerns and answering all their questions. At the time of discharge, they appeared to be satisfied and did not have any further questions.  Procedures:  none  Consultations:  none  Discharge Exam: Filed Vitals:   06/09/13 1113  BP: 142/94  Pulse: 88  Temp: 98.2 F (36.8 C)  Resp: 18    General: NAD Cardiovascular: S1, S2 RRR Respiratory: CTA B  Discharge Instructions  Discharge Orders   Future Orders Complete By Expires   Diet - low sodium heart healthy  As directed    Increase activity slowly  As directed        Medication List         aspirin EC 81 MG tablet  Take 81 mg by mouth at bedtime.     butorphanol 10 MG/ML nasal spray  Commonly known as:  STADOL  Place 1 spray into the nose every 4 (four) hours as needed for headache.     cyclobenzaprine 10 MG tablet  Commonly known as:  FLEXERIL  Take 1 tablet (10 mg total) by mouth 3 (three) times daily as needed. For muscle relaxation     diazepam 5 MG tablet  Commonly known as:  VALIUM  Take 5 mg by mouth every 6 (six) hours as needed for anxiety.     HYDROcodone-acetaminophen 10-325 MG per tablet  Commonly known as:  NORCO  Take 1 tablet by mouth every  4 (four) hours as needed for moderate pain.     lisinopril-hydrochlorothiazide 20-12.5 MG per tablet  Commonly known as:  PRINZIDE,ZESTORETIC  Take 1 tablet by mouth at bedtime.     ondansetron 8 MG tablet  Commonly known as:  ZOFRAN  Take 1 tablet (8 mg total) by mouth every 8 (eight) hours as needed for nausea.     pantoprazole 40 MG tablet  Commonly known as:  PROTONIX  Take 40 mg by mouth daily.     PHILLIPS COLON HEALTH PO  Take 1 capsule by mouth at bedtime.     SALINE NASAL SPRAY NA  Place 1 spray into the nose daily as needed (dryness).     traMADol 50 MG tablet  Commonly known as:  ULTRAM  Take 1 tablet (50 mg total) by mouth 4 (four) times daily as needed for pain.     zolpidem 5 MG tablet  Commonly known as:  AMBIEN  Take 5 mg by mouth at bedtime as needed for sleep.       Allergies  Allergen Reactions  . Lipitor [Atorvastatin] Other (See Comments)    Myalgia       Follow-up Information   Follow up with Mercie Eon. (as scheduled)    Specialty:  Neurology   Contact  information:   [address] [place] [address]       Follow up with Lilyan Punt, MD. Schedule an appointment as soon as possible for a visit in 2 weeks.   Specialty:  Family Medicine   Contact information:   [address] [place] [address] [phone]        The results of significant diagnostics from this hospitalization (including imaging, microbiology, ancillary and laboratory) are listed below for reference.    Significant Diagnostic Studies: Dg Abd Acute W/chest  05/19/2013   CLINICAL DATA:  Abdominal pain.  EXAM: ACUTE ABDOMEN SERIES (ABDOMEN 2 VIEW & CHEST 1 VIEW)  COMPARISON:  None.  FINDINGS: There is no evidence of dilated bowel loops or free intraperitoneal air. No radiopaque calculi or other significant radiographic abnormality is seen.  Heart size and mediastinal contours are within normal limits. Both lungs are clear.  IMPRESSION: Negative  abdominal radiographs.  No acute cardiopulmonary disease.   Electronically Signed   By: Elige Ko   On: 05/19/2013 15:54    Microbiology: No results found for this or any previous visit (from the past 240 hour(s)).   Labs: Basic Metabolic Panel:  Recent Labs Lab 06/09/13 0646  NA 141  K 3.5  CL 104  GLUCOSE 211*  BUN 20  CREATININE 1.10   Liver Function Tests:  Recent Labs Lab 06/09/13 0950  AST 43*  ALT 99*  ALKPHOS 83  BILITOT 0.6  PROT 8.3  ALBUMIN 4.5    Recent Labs Lab 06/09/13 0950  LIPASE 20   No results found for this basename: AMMONIA,  in the last 168 hours CBC:  Recent Labs Lab 06/09/13 0646  HGB 17.3*  HCT 51.0   Cardiac Enzymes: No results found for this basename: CKTOTAL, CKMB, CKMBINDEX, TROPONINI,  in the last 168 hours BNP: BNP (last 3 results) No results found for this basename: PROBNP,  in the last 8760 hours CBG: No results found for this basename: GLUCAP,  in the last 168 hours     Signed:  Demetrios Byron  [facility] 06/09/2013, 8:27 PM

## 2013-06-09 NOTE — Progress Notes (Signed)
Late Entry:  1030  Approximately notified Tora due the patient wanted to speak with someone in administration regarding the care provided.  Tora verbalized understanding and went to the patients room to speak with him and his wife.    1130 Notified Dr. Kerry Hough due to patient requesting to be on his regular treatment of when he comes to the hospital which was: one dose of Dilauidid, then 30 minutes after another dose, then after one hour give one more dose and to reassess. The patient also asked for a GI cocktail.  MD agree with the the patients request.  New orders given and followed.  1320 Notified Dr. Kerry Hough for the patient who states that he is no longer in pain and his vomiting has subsided and he is now ready for discharge.  The wife/pt  stated that they would like to speak with the MD if it is any way possible.  I voiced to them I would notify him of this.  They verbalized understanding.  She also requested an update in regards to speaking with administration prior to d/c.  I notified Peggye Fothergill and asked her to speak with them again to follow up.  1330  Dr. Kerry Hough verbalized understanding of the pt/family wanting to speak with them and about the cession of the symptoms.  He verbalized understanding.  1345 Notified Dr. Kerry Hough via text that the wife/pt states that they do not actually have to speak with him unless he feels that he (Dr. Kerry Hough) will need to talk with them.  I text paged this back to the MD. MD states that he will be up to talk with the pt/wife shortly.  1530 The patient was discharged with instructions and care notes.  He and the wife verbalized understanding.  After the patient filled out the release of information form I took the form down to medical records and gave it to staff.  This was voiced to the patient/family.  The pt ambulated off the floor with staff and his wife per his request.  He was in stable condition.

## 2013-06-09 NOTE — H&P (Signed)
[facility] History and Physical  Bryan Gilbert AOZ:[id number] DOB: [birth year] DOA: 06/09/2013  Referring physician: Dr. Bebe Shaggy, ER physician PCP: Lilyan Punt, MD  Specialists: Dr. Thomasena Edis, neurologist at [facility]  Chief Complaint: Abdominal pain and vomiting  HPI: Bryan Gilbert is a 47 y.o. male with history of chronic epigastric pain with associated vomiting. His wife reports that the patient has had these symptoms for approximately 13 years. He had extensive workup done with gastroenterology without any significant findings to explain his symptoms. More recently, he was referred to [facility] neurology and was diagnosed with abdominal migraine/cyclic vomiting syndrome. Has been on multiple drug regimens to treat his symptoms. He has tried nortriptyline without any significant improvement and this was subsequently discontinued. He reports having tried benzodiazepines in the past with no significant improvement. Regarding pain management, he was tried on morphine in the past without improvement. The only medicines he reports a somewhat improved his symptoms have been Nubain as well as hydromorphone. Review of records indicate that patient was previously taking oral  hydromorphone prescribed by his primary care physician which helped control his symptoms. More recently, his wife reports that Dilaudid was discontinued since the patient had difficulty "absorbing this medicine". He has been started on intranasal Stadol with some improvement of his symptoms. Typically, they report that his abdominal migraine can last approximately one to 2 weeks. During this time period, he intermittently develops severe epigastric pain with continuous vomiting. Intranasal Stadol occasionally does relieve his symptoms, but when this does not improve, then he reports to the emergency room for evaluation. He feels his symptoms at this time are very typical of abdominal migraine. Initially, the patient  experiences diarrhea, this is followed by intense epigastric pain followed by persistent vomiting. He's not had any fever, has not been eating out, no sick contacts. No melena or hematochezia, no dysuria, shortness of breath chest pain or cough. Review of records from [facility] indicates that his neurologist has recommended that patient receive intravenous Dilaudid to help manage his acute attacks. The patient has received several antiemetics in the emergency room without relief. He will be admitted to the hospital for further management.  Review of Systems: Limited since patient is not participating history, pertinent positives as per history of present illness, otherwise negative  Past Medical History  Diagnosis Date  . Cyclical vomiting   . Hypertension   . Slipped intervertebral disc   . Sleep apnea     states uses O2 and CPAP at night  . Cervical neuralgia   . Elevated hemoglobin    Past Surgical History  Procedure Laterality Date  . Esophagogastroduodenoscopy     Social History:  reports that he has never smoked. He has never used smokeless tobacco. He reports that he does not drink alcohol or use illicit drugs.   Allergies  Allergen Reactions  . Lipitor [Atorvastatin] Other (See Comments)    Myalgia    Family History  Problem Relation Age of Onset  . Heart failure Father   . Hypertension Father     Prior to Admission medications   Medication Sig Start Date End Date Taking? Authorizing Provider  aspirin EC 81 MG tablet Take 81 mg by mouth at bedtime.    Yes Historical Provider, MD  butorphanol (STADOL) 10 MG/ML nasal spray Place 1 spray into the nose every 4 (four) hours as needed for headache.   Yes Historical Provider, MD  cyclobenzaprine (FLEXERIL) 10 MG tablet Take 1 tablet (10 mg total) by  mouth 3 (three) times daily as needed. For muscle relaxation 04/24/13  Yes Babs Sciara, MD  diazepam (VALIUM) 5 MG tablet Take 5 mg by mouth every 6 (six) hours as needed  for anxiety.   Yes Historical Provider, MD  HYDROcodone-acetaminophen (NORCO) 10-325 MG per tablet Take 1 tablet by mouth every 4 (four) hours as needed for moderate pain. 04/24/13  Yes Babs Sciara, MD  lisinopril-hydrochlorothiazide (PRINZIDE,ZESTORETIC) 20-12.5 MG per tablet Take 1 tablet by mouth at bedtime. 11/30/12 11/30/13 Yes Babs Sciara, MD  ondansetron (ZOFRAN) 8 MG tablet Take 1 tablet (8 mg total) by mouth every 8 (eight) hours as needed for nausea. 10/03/12  Yes Babs Sciara, MD  pantoprazole (PROTONIX) 40 MG tablet Take 40 mg by mouth daily.   Yes Historical Provider, MD  Probiotic Product (PHILLIPS COLON HEALTH PO) Take 1 capsule by mouth at bedtime.    Yes Historical Provider, MD  SALINE NASAL SPRAY NA Place 1 spray into the nose daily as needed (dryness).   Yes Historical Provider, MD  traMADol (ULTRAM) 50 MG tablet Take 1 tablet (50 mg total) by mouth 4 (four) times daily as needed for pain. 04/24/13  Yes Babs Sciara, MD  zolpidem (AMBIEN) 5 MG tablet Take 5 mg by mouth at bedtime as needed for sleep.   Yes Historical Provider, MD   Physical Exam: Filed Vitals:   06/09/13 0552  BP: 134/87  Pulse: 88  Temp: 98.9 F (37.2 C)  Resp: 18     General:  Patient is lying in bed, knees are drawn up to his chest, eyes are closed and he does not participate in history.  Eyes: Pupils are equal, round, reactive to light  ENT: Mucous membranes are moist  Neck: Supple  Cardiovascular: S1, S2, regular rate and rhythm  Respiratory: Clear to auscultation bilaterally  Abdomen: Soft, tender in his epigastrium, positive bowel sounds  Skin: No rashes  Musculoskeletal: No pedal edema bilaterally  Psychiatric: Difficult to assess since patient does not engage in conversation  Neurologic: Grossly intact, nonfocal  Labs on Admission:  Basic Metabolic Panel:  Recent Labs Lab 06/09/13 0646  NA 141  K 3.5  CL 104  GLUCOSE 211*  BUN 20  CREATININE 1.10   Liver  Function Tests: No results found for this basename: AST, ALT, ALKPHOS, BILITOT, PROT, ALBUMIN,  in the last 168 hours No results found for this basename: LIPASE, AMYLASE,  in the last 168 hours No results found for this basename: AMMONIA,  in the last 168 hours CBC:  Recent Labs Lab 06/09/13 0646  HGB 17.3*  HCT 51.0   Cardiac Enzymes: No results found for this basename: CKTOTAL, CKMB, CKMBINDEX, TROPONINI,  in the last 168 hours  BNP (last 3 results) No results found for this basename: PROBNP,  in the last 8760 hours CBG: No results found for this basename: GLUCAP,  in the last 168 hours  Radiological Exams on Admission: No results found.  Assessment/Plan Principal Problem:   Cyclic vomiting syndrome Active Problems:   HTN (hypertension)   Hyperglycemia   Obstructive sleep apnea   Cyclical vomiting syndrome   1. Cyclical vomiting syndrome. The patient will be hydrated with IV fluids. We'll start the patient on intravenous Dilaudid as needed. His neurologist has recommended 4 mg pushes of IV Dilaudid. I discussed this with the patient's wife and she reports that he usually receives 2 mg of IV Dilaudid with relief. Patient will be started on this regimen.  We will start him on clear liquids and advance to solid foods tolerated. Once the patient's symptoms are improved, he can be discharged home. 2. Obstructive sleep apnea. Will order CPAP. 3. Hypertension. Continue outpatient regimen.  4. Hyperglycemia. Possibly stress induced. Will  follow CBGs.  Code Status:full code Family Communication: discussed with wife at the bedside Disposition Plan: discharge home once improved  Time spent:   MEMON,JEHANZEB [facility] Pager [phone]  If 7PM-7AM, please contact night-coverage [email or web address] Password [facility] 06/09/2013, 9:13 AM

## 2013-06-09 NOTE — ED Provider Notes (Signed)
CSN: [id number]     Arrival date & time 06/09/13  1308 History   First MD Initiated Contact with Patient 06/09/13 0540     Chief Complaint  Patient presents with  . Abdominal Pain  . Emesis    Patient is a 47 y.o. male presenting with abdominal pain and vomiting. The history is provided by the patient and the spouse.  Abdominal Pain Pain location:  Epigastric Pain quality: aching   Pain radiates to:  Does not radiate Pain severity:  Severe Onset quality:  Gradual Duration: several hours. Timing:  Constant Chronicity:  Recurrent Relieved by:  Nothing Associated symptoms: diarrhea, nausea and vomiting   Associated symptoms: no chest pain, no dysuria, no fever, no hematemesis and no hematochezia   Emesis Associated symptoms: abdominal pain and diarrhea    Patient presents for epigastric abdominal pain and episode of vomiting He reports h/o abdominal migraines and cyclical vomiting He reports this episode is similar to prior episodes He reports he does not have his home doses of stadol that usually improve his symptoms  Past Medical History  Diagnosis Date  . Cyclical vomiting   . Hypertension   . Slipped intervertebral disc   . Sleep apnea     states uses O2 and CPAP at night  . Cervical neuralgia   . Elevated hemoglobin    Past Surgical History  Procedure Laterality Date  . Esophagogastroduodenoscopy     Family History  Problem Relation Age of Onset  . Heart failure Father   . Hypertension Father    History  Substance Use Topics  . Smoking status: Never Smoker   . Smokeless tobacco: Never Used  . Alcohol Use: No    Review of Systems  Constitutional: Negative for fever.  Cardiovascular: Negative for chest pain.  Gastrointestinal: Positive for nausea, vomiting, abdominal pain and diarrhea. Negative for blood in stool, hematochezia and hematemesis.  Genitourinary: Negative for dysuria.    Allergies  Lipitor  Home Medications   Current Outpatient Rx  Name   Route  Sig  Dispense  Refill  . aspirin EC 81 MG tablet   Oral   Take 81 mg by mouth at bedtime.          . butorphanol (STADOL) 10 MG/ML nasal spray   Nasal   Place 1 spray into the nose every 4 (four) hours as needed for headache.         . cyclobenzaprine (FLEXERIL) 10 MG tablet   Oral   Take 1 tablet (10 mg total) by mouth 3 (three) times daily as needed. For muscle relaxation   90 tablet   5   . diazepam (VALIUM) 5 MG tablet   Oral   Take 5 mg by mouth every 6 (six) hours as needed for anxiety.         Marland Kitchen HYDROcodone-acetaminophen (NORCO) 10-325 MG per tablet   Oral   Take 1 tablet by mouth every 4 (four) hours as needed for moderate pain.         Marland Kitchen lisinopril-hydrochlorothiazide (PRINZIDE,ZESTORETIC) 20-12.5 MG per tablet   Oral   Take 1 tablet by mouth at bedtime.   30 tablet   5   . ondansetron (ZOFRAN) 8 MG tablet   Oral   Take 1 tablet (8 mg total) by mouth every 8 (eight) hours as needed for nausea.   20 tablet   6   . pantoprazole (PROTONIX) 40 MG tablet   Oral   Take 40 mg by mouth  daily.         . Probiotic Product (PHILLIPS COLON HEALTH PO)   Oral   Take 1 capsule by mouth at bedtime.          . traMADol (ULTRAM) 50 MG tablet   Oral   Take 1 tablet (50 mg total) by mouth 4 (four) times daily as needed for pain.   30 tablet   1   . zolpidem (AMBIEN) 5 MG tablet   Oral   Take 5 mg by mouth at bedtime as needed for sleep.          BP 134/87  Pulse 88  Temp(Src) 98.9 F (37.2 C) (Oral)  Resp 18  Ht 5\' 7"  (1.702 m)  Wt 200 lb (90.719 kg)  BMI 31.32 kg/m2  SpO2 99% Physical Exam CONSTITUTIONAL: Well developed/well nourished, huddled over pillow HEAD: Normocephalic/atraumatic EYES: EOMI/PERRL, no icterus ENMT: Mucous membranes dry NECK: supple no meningeal signs SPINE:entire spine nontender CV: S1/S2 noted, no murmurs/rubs/gallops noted LUNGS: Lungs are clear to auscultation bilaterally, no apparent distress ABDOMEN: soft,  mild tenderness to palpation of epigastric region, no rebound or guarding GU:no cva tenderness NEURO: Pt is awake/alert, moves all extremitiesx4 EXTREMITIES: pulses normal, full ROM SKIN: warm, color normal PSYCH: anxious  ED Course  Procedures (including critical care time) Labs Review Labs Reviewed - No data to display Imaging Review No results found.  EKG Interpretation   None     6:28 AM Nurse Rivka Barbara was at bedside for entire interview Pt with h/o cyclic vomiting syndrome, reports this episode is similar to prior episodes Pt has been seen before, and he reports the only thing that helps is IV dilaudid He does have established relationship with neurologist at [facility], who recommends IV dilaudid However as has been established, it is up to individual provider that evaluates patient if dilaudid is indicated in his management  At this point, I feel it is appropriate to manage his symptoms with non-narcotic medications I will also order IV fluids as well as check chemistry panel Wife and patient were visibly upset since he would not be receiving IV dilaudid They requested to see "another doctor" and "go ahead and admit" him I advised need to try medications first to alleviate symptoms Wife requests "to escalate" and see "another doctor" and she reports she would not hesitate to call medical director Dr Fonnie Jarvis "at home right now" I asked the nurse [facility] to speak to patient/spouse 7:12 AM Pt reports symptoms not improved with first round of medicine Will re-dose reglan and benadryl 7:42 AM Pt reports he has had no relief after multiple medications Will call hospitalist to evaluate patient  MDM  No diagnosis found. Nursing notes including past medical history and social history reviewed and considered in documentation Previous records reviewed and considered Labs/vital reviewed and considered     Joya Gaskins, MD 06/09/13 [phone]

## 2013-06-09 NOTE — ED Notes (Signed)
Abdominal migraine and cyclic vomiting per spouse. Seen at [facility] by a neurologist and a gastroenterologist. Usually a combination of Dilaudid, Toradol, and protonix is prescribed and it helps per.

## 2013-06-09 NOTE — ED Provider Notes (Signed)
D/w dr Kerry Hough who will evaluate patient for admission I spoke to dr Kerry Hough about h/o cyclic vomiting and his evaluations at [facility] and their recommendations Dr Kerry Hough will evaluate patient in the ED  Joya Gaskins, MD 06/09/13 [id number]

## 2013-06-19 ENCOUNTER — Telehealth: Payer: Self-pay | Admitting: Family Medicine

## 2013-06-19 NOTE — Telephone Encounter (Signed)
Clarify usage please, we can do Rx to pick up will need OV in Jan

## 2013-06-19 NOTE — Telephone Encounter (Signed)
Patient needs Rx for hydrocodone. °

## 2013-06-20 MED ORDER — HYDROCODONE-ACETAMINOPHEN 10-325 MG PO TABS: ORAL_TABLET | ORAL | Status: DC

## 2013-06-20 NOTE — Telephone Encounter (Signed)
Left message on voicemail to return call.

## 2013-06-20 NOTE — Telephone Encounter (Signed)
Med ready for pickup. Pt notified and he scheduled follow up office visit.

## 2013-06-20 NOTE — Telephone Encounter (Signed)
Patient states that he takes hydrocodone 10/325 mg 1 tab five times daily #150.

## 2013-06-21 ENCOUNTER — Other Ambulatory Visit: Payer: Self-pay | Admitting: Family Medicine

## 2013-06-30 ENCOUNTER — Encounter: Payer: Self-pay | Admitting: Family Medicine

## 2013-07-10 ENCOUNTER — Encounter: Payer: Self-pay | Admitting: Family Medicine

## 2013-07-10 ENCOUNTER — Ambulatory Visit (INDEPENDENT_AMBULATORY_CARE_PROVIDER_SITE_OTHER): Payer: Managed Care, Other (non HMO) | Admitting: Family Medicine

## 2013-07-10 VITALS — BP 132/80 | Temp 98.6°F | Ht 67.0 in | Wt 208.0 lb

## 2013-07-10 DIAGNOSIS — R7402 Elevation of levels of lactic acid dehydrogenase (LDH): Secondary | ICD-10-CM

## 2013-07-10 DIAGNOSIS — R7401 Elevation of levels of liver transaminase levels: Secondary | ICD-10-CM

## 2013-07-10 DIAGNOSIS — R739 Hyperglycemia, unspecified: Secondary | ICD-10-CM

## 2013-07-10 DIAGNOSIS — R7309 Other abnormal glucose: Secondary | ICD-10-CM

## 2013-07-10 DIAGNOSIS — R6889 Other general symptoms and signs: Secondary | ICD-10-CM

## 2013-07-10 DIAGNOSIS — R1115 Cyclical vomiting syndrome unrelated to migraine: Secondary | ICD-10-CM

## 2013-07-10 DIAGNOSIS — R7989 Other specified abnormal findings of blood chemistry: Secondary | ICD-10-CM

## 2013-07-10 DIAGNOSIS — I10 Essential (primary) hypertension: Secondary | ICD-10-CM

## 2013-07-10 DIAGNOSIS — R740 Nonspecific elevation of levels of transaminase and lactic acid dehydrogenase [LDH]: Secondary | ICD-10-CM

## 2013-07-10 MED ORDER — DIAZEPAM 5 MG PO TABS: 5.0000 mg | ORAL_TABLET | Freq: Four times a day (QID) | ORAL | Status: DC | PRN

## 2013-07-10 MED ORDER — HYDROCODONE-ACETAMINOPHEN 10-325 MG PO TABS: ORAL_TABLET | ORAL | Status: DC

## 2013-07-10 MED ORDER — TRAMADOL HCL 50 MG PO TABS: 50.0000 mg | ORAL_TABLET | Freq: Four times a day (QID) | ORAL | Status: DC | PRN

## 2013-07-10 MED ORDER — PANTOPRAZOLE SODIUM 40 MG PO TBEC: 40.0000 mg | DELAYED_RELEASE_TABLET | Freq: Two times a day (BID) | ORAL | Status: DC

## 2013-07-10 NOTE — Progress Notes (Signed)
   Subjective:    Patient ID: Elwyn Lade Mosqueda, male    DOB: 09-28-1965, 48 y.o.   MRN: [id number]  HPIHere for a follow up. Needs refill on hydrocodone. Was admitted to hospital in December for vomiting. Has follow up with neurologist next Monday.  He has chronic neck pain these can be seen a specialist for an injection 20-25 minutes was spent with patient discussing his hospital stay what he did for him and what has been done for him in the past.  Abdominal migraine history this was discussed in detail also neck nerve impingement this was discussed in detail and dyspepsia.   Review of Systems    denies chest tightness pressure pain shortness breath does relate intermittent headaches intermittent abdominal pains slight nausea Objective:   Physical Exam Lungs are clear hearts regular pulse normal abdomen soft extremities no edema skin warm dry blood pressure good       Assessment & Plan:  #1 chronic abdominal pain/abdominal migraines followup with specialist using medication as directed #2 chronic neck pain and uses hydrocodone about 5 times per day he needs prescriptions #3 intermittent muscle spasms associated with the neck needs refills on his medicine #4 patient was cautioned regarding medications interactions and how he should not drive if taking these medicines where they're making him feel drowsy Protonic school be used twice daily. This was started a few weeks that seems to be helping him. Once daily did not do enough. 25 minutes spent with patient. Followup 3-4 months.  Patient had abnormal labs while in the hospital we will recheck thyroid glucose and liver functions

## 2013-08-11 ENCOUNTER — Other Ambulatory Visit: Payer: Self-pay | Admitting: Family Medicine

## 2013-08-12 LAB — HEPATIC FUNCTION PANEL
ALT: 43 U/L (ref 0–53)
AST: 25 U/L (ref 0–37)
Albumin: 4.3 g/dL (ref 3.5–5.2)
Alkaline Phosphatase: 70 U/L (ref 39–117)
Bilirubin, Direct: 0.1 mg/dL (ref 0.0–0.3)
Indirect Bilirubin: 0.5 mg/dL (ref 0.2–1.2)
Total Bilirubin: 0.6 mg/dL (ref 0.2–1.2)
Total Protein: 7.1 g/dL (ref 6.0–8.3)

## 2013-08-12 LAB — BASIC METABOLIC PANEL
BUN: 20 mg/dL (ref 6–23)
CO2: 29 mEq/L (ref 19–32)
Calcium: 9.7 mg/dL (ref 8.4–10.5)
Chloride: 101 mEq/L (ref 96–112)
Creat: 1.12 mg/dL (ref 0.50–1.35)
Glucose, Bld: 87 mg/dL (ref 70–99)
Potassium: 4.2 mEq/L (ref 3.5–5.3)
Sodium: 139 mEq/L (ref 135–145)

## 2013-08-12 LAB — HEMOGLOBIN A1C
Hgb A1c MFr Bld: 5.6 % (ref <5.7)
Mean Plasma Glucose: 114 mg/dL (ref <117)

## 2013-08-12 LAB — TSH: TSH: 0.885 u[IU]/mL (ref 0.350–4.500)

## 2013-08-13 NOTE — Telephone Encounter (Signed)
May refill +4 additional refills

## 2013-08-14 ENCOUNTER — Encounter: Payer: Self-pay | Admitting: Family Medicine

## 2013-08-23 ENCOUNTER — Telehealth: Payer: Self-pay | Admitting: Family Medicine

## 2013-08-23 NOTE — Telephone Encounter (Signed)
May give 2 scripts for 6/day. When was last script filled? The 6 per day script may be filled 25 days after the last 5 perday script was filled. Will need standard visit in April

## 2013-08-23 NOTE — Telephone Encounter (Signed)
Patient needing to change prescription for hydrocodone to 6 aday instead of 5 aday. Cant get pain shot until march 9 he will bring back old others prescription for new one.

## 2013-08-23 NOTE — Telephone Encounter (Signed)
Last filled 07/30/13

## 2013-08-24 MED ORDER — HYDROCODONE-ACETAMINOPHEN 10-325 MG PO TABS: ORAL_TABLET | ORAL | Status: DC

## 2013-08-24 NOTE — Addendum Note (Signed)
Addended by: Dairl Ponder on: 08/24/2013 11:19 AM   Modules accepted: Orders

## 2013-08-24 NOTE — Addendum Note (Signed)
Addended by: Dairl Ponder on: 08/24/2013 08:27 AM   Modules accepted: Orders

## 2013-08-24 NOTE — Telephone Encounter (Signed)
Rx printed and left up front for patient pick up-patient notified.

## 2013-08-24 NOTE — Addendum Note (Signed)
Addended by: Dairl Ponder on: 08/24/2013 09:00 AM   Modules accepted: Orders

## 2013-08-24 NOTE — Telephone Encounter (Signed)
Patient stated he only wants to do 6 a day for one month till his injection then he will go back to 5 a day.

## 2013-10-23 ENCOUNTER — Telehealth: Payer: Self-pay | Admitting: Family Medicine

## 2013-10-23 MED ORDER — HYDROCODONE-ACETAMINOPHEN 10-325 MG PO TABS: ORAL_TABLET | ORAL | Status: DC

## 2013-10-23 NOTE — Telephone Encounter (Signed)
Notified patient script ready for pick up. 

## 2013-10-23 NOTE — Telephone Encounter (Signed)
May give refill, keep follow up

## 2013-10-23 NOTE — Telephone Encounter (Signed)
Patient is requesting a refill on his hydrocodone. He said he is completely out and has a medication check appointment for next Wednesday.

## 2013-10-23 NOTE — Telephone Encounter (Signed)
Last seen 07/10/13

## 2013-11-01 ENCOUNTER — Encounter: Payer: Self-pay | Admitting: Family Medicine

## 2013-11-01 ENCOUNTER — Ambulatory Visit (INDEPENDENT_AMBULATORY_CARE_PROVIDER_SITE_OTHER): Payer: Managed Care, Other (non HMO) | Admitting: Family Medicine

## 2013-11-01 VITALS — BP 112/70

## 2013-11-01 DIAGNOSIS — I10 Essential (primary) hypertension: Secondary | ICD-10-CM

## 2013-11-01 DIAGNOSIS — J3489 Other specified disorders of nose and nasal sinuses: Secondary | ICD-10-CM

## 2013-11-01 DIAGNOSIS — G8929 Other chronic pain: Secondary | ICD-10-CM

## 2013-11-01 DIAGNOSIS — R0981 Nasal congestion: Secondary | ICD-10-CM

## 2013-11-01 DIAGNOSIS — M5412 Radiculopathy, cervical region: Secondary | ICD-10-CM

## 2013-11-01 DIAGNOSIS — R1115 Cyclical vomiting syndrome unrelated to migraine: Secondary | ICD-10-CM

## 2013-11-01 MED ORDER — HYDROCODONE-ACETAMINOPHEN 10-325 MG PO TABS: ORAL_TABLET | ORAL | Status: DC

## 2013-11-01 NOTE — Progress Notes (Signed)
   Subjective:    Patient ID: Bryan Gilbert, male    DOB: 06/13/1966, 48 y.o.   MRN: [id number]  HPIMed check up. Patient states no concerns today.  Long discussion held with patient importance of taking his medication. Also discussed about how some medications occasionally can cause drowsiness and if so he should not drive when drowsy he relates reflux is been under good control he sometimes has to take all tram for sinus headaches he uses over-the-counter sinus medications. In addition to this he states he cyclic or vomiting under good control seen specialist next week He does use pain medication on a chronic basis for his neck up to 6 per day This patient was seen today for chronic pain  The medication list was reviewed and updated.  Discussion was held with the patient regarding compliance with pain medication. The patient was advised the importance of maintaining medication and not using illegal substances with these. The patient was educated that we can provide 3 monthly scripts for their medication, it is their responsibility to follow the instructions. Discussion was held with the patient to make sure they're not having significant side effects. Patient is aware that pain medications are meant to minimize the severity of the pain to allow their pain levels to improve to allow for better function. They are aware of that pain medications cannot totally remove their pain.     Review of Systems  Constitutional: Negative for activity change, appetite change and fatigue.  HENT: Positive for congestion.   Respiratory: Negative for cough and chest tightness.   Cardiovascular: Negative for chest pain.  Gastrointestinal: Negative for abdominal pain.  Endocrine: Negative for polydipsia and polyphagia.  Musculoskeletal: Positive for back pain, neck pain and neck stiffness.  Neurological: Negative for weakness.  Psychiatric/Behavioral: Negative for confusion.       Objective:   Physical Exam   Vitals reviewed. Constitutional: He appears well-nourished. No distress.  HENT:  Head: Normocephalic and atraumatic.  Right Ear: External ear normal.  Left Ear: External ear normal.  Cardiovascular: Normal rate, regular rhythm and normal heart sounds.   No murmur heard. Pulmonary/Chest: Effort normal and breath sounds normal. No respiratory distress.  Musculoskeletal: He exhibits no edema.  Lymphadenopathy:    He has no cervical adenopathy.  Neurological: He is alert.  Psychiatric: His behavior is normal.          Assessment & Plan:  1. HTN (hypertension) Blood pressure under decent control blood pressure taken proper cuff continue current measures watching diet as well exercise.  2. Cyclic vomiting syndrome Has been stable recently follows up with specialist next week  3. Chronic radicular cervical pain Has chronic pain discomfort sometimes having to take 6 tablets every single day he was given prescriptions for 180 tablets cautioned drowsiness seems to tolerate this well 3 scripts were given followup when he skin toward the end of his medicine  4. Sinus congestion Chronic sinus congestion uses over-the-counter measures currently patient was told that we could refer him to ENT specialist for further evaluation if he is interested they may be able to do sinus ostomy at surgery  Lab work later this year followup several months

## 2013-11-13 ENCOUNTER — Telehealth: Payer: Self-pay | Admitting: Family Medicine

## 2013-11-13 MED ORDER — HYDROCODONE-ACETAMINOPHEN 10-325 MG PO TABS: ORAL_TABLET | ORAL | Status: DC

## 2013-11-13 NOTE — Telephone Encounter (Signed)
Please redo the prescriptions to stay 180 tablets one every 4 hours may take up to 6 per day. He may have 3 prescriptions and his wife to bring in the other ones. When I last saw him the prescription was 480 tablets but apparently it also stated on their no more than 5 per month when I had told the patient that he could use 6 per month. That is why we need new prescriptions.

## 2013-11-13 NOTE — Telephone Encounter (Signed)
[phone] Cell for Wells Guiles

## 2013-11-13 NOTE — Telephone Encounter (Signed)
Pt is having too  Much pain an is going back up to taking six Hydrocodone  A day due to the pain at his neck. Will be getting a shot for this in late June   Can we get enough extra for this increase? They will bring back original scripts to  Change out to reflect the 6xday instead of the 5x currently on hand.

## 2013-11-13 NOTE — Telephone Encounter (Signed)
Bryan Gilbert came to the office and was given the scripts by the front desk.

## 2013-12-09 ENCOUNTER — Other Ambulatory Visit: Payer: Self-pay | Admitting: Family Medicine

## 2013-12-18 ENCOUNTER — Ambulatory Visit (INDEPENDENT_AMBULATORY_CARE_PROVIDER_SITE_OTHER): Payer: Managed Care, Other (non HMO) | Admitting: Family Medicine

## 2013-12-18 ENCOUNTER — Encounter: Payer: Self-pay | Admitting: Family Medicine

## 2013-12-18 VITALS — BP 118/82 | Temp 98.4°F | Ht 67.0 in | Wt 221.6 lb

## 2013-12-18 DIAGNOSIS — J3489 Other specified disorders of nose and nasal sinuses: Secondary | ICD-10-CM

## 2013-12-18 DIAGNOSIS — H60399 Other infective otitis externa, unspecified ear: Secondary | ICD-10-CM

## 2013-12-18 MED ORDER — NEOMYCIN-POLYMYXIN-HC 3.5-10000-1 OT SOLN: 4.0000 [drp] | Freq: Three times a day (TID) | OTIC | Status: AC

## 2013-12-18 MED ORDER — CIPROFLOXACIN HCL 500 MG PO TABS: 500.0000 mg | ORAL_TABLET | Freq: Two times a day (BID) | ORAL | Status: DC

## 2013-12-18 NOTE — Progress Notes (Signed)
   Subjective:    Patient ID: Bryan Gilbert, male    DOB: 11-Jul-1965, 48 y.o.   MRN: [id number]  Otalgia  There is pain in the right ear. This is a new problem. The current episode started in the past 7 days. Associated symptoms include drainage, headaches and rhinorrhea.   Having chronic sinus pressure congestion drainage. Ear discomfort is just recent  Review of Systems  HENT: Positive for ear pain and rhinorrhea.   Neurological: Positive for headaches.       Objective:   Physical Exam  Otitis externa is noted throat is normal moderate sinus congestion with pressure      Assessment & Plan:  Chronic sinus pressure patient would like to see ENT for possible endoscopy and correction of sinuses  Otitis externa drops and antibiotics prescribed

## 2014-01-19 ENCOUNTER — Encounter: Payer: Self-pay | Admitting: Family Medicine

## 2014-02-01 ENCOUNTER — Telehealth: Payer: Self-pay | Admitting: Family Medicine

## 2014-02-01 MED ORDER — HYDROCODONE-ACETAMINOPHEN 10-325 MG PO TABS: ORAL_TABLET | ORAL | Status: DC

## 2014-02-01 MED ORDER — TRAMADOL HCL 50 MG PO TABS: 50.0000 mg | ORAL_TABLET | Freq: Four times a day (QID) | ORAL | Status: DC | PRN

## 2014-02-01 NOTE — Telephone Encounter (Signed)
Yes, just enough to

## 2014-02-01 NOTE — Telephone Encounter (Signed)
19 days worth of meds

## 2014-02-01 NOTE — Telephone Encounter (Signed)
Pt's meds will run out 02/11/14, has appt here for pain med follow up on 03/02/14, can we write a refill to get him through until his appointment  HYDROcodone-acetaminophen ([place]) 10-325 MG per tablet  traMADol (ULTRAM) 50 MG tablet  Please advise and call when ready

## 2014-02-02 NOTE — Telephone Encounter (Signed)
Patient notified

## 2014-02-04 ENCOUNTER — Other Ambulatory Visit: Payer: Self-pay | Admitting: Family Medicine

## 2014-02-05 ENCOUNTER — Other Ambulatory Visit: Payer: Self-pay | Admitting: *Deleted

## 2014-02-05 MED ORDER — LISINOPRIL-HYDROCHLOROTHIAZIDE 20-12.5 MG PO TABS: 1.0000 | ORAL_TABLET | Freq: Every day | ORAL | Status: DC

## 2014-02-05 MED ORDER — PANTOPRAZOLE SODIUM 40 MG PO TBEC: 40.0000 mg | DELAYED_RELEASE_TABLET | Freq: Every day | ORAL | Status: DC

## 2014-02-08 ENCOUNTER — Other Ambulatory Visit: Payer: Self-pay | Admitting: Family Medicine

## 2014-02-20 ENCOUNTER — Encounter: Payer: Self-pay | Admitting: Family Medicine

## 2014-03-02 ENCOUNTER — Ambulatory Visit (INDEPENDENT_AMBULATORY_CARE_PROVIDER_SITE_OTHER): Payer: Managed Care, Other (non HMO) | Admitting: Family Medicine

## 2014-03-02 ENCOUNTER — Encounter: Payer: Self-pay | Admitting: Family Medicine

## 2014-03-02 VITALS — BP 120/72 | Ht 66.0 in | Wt 224.0 lb

## 2014-03-02 DIAGNOSIS — Z0000 Encounter for general adult medical examination without abnormal findings: Secondary | ICD-10-CM

## 2014-03-02 DIAGNOSIS — I10 Essential (primary) hypertension: Secondary | ICD-10-CM

## 2014-03-02 DIAGNOSIS — M5412 Radiculopathy, cervical region: Secondary | ICD-10-CM

## 2014-03-02 DIAGNOSIS — G8929 Other chronic pain: Secondary | ICD-10-CM

## 2014-03-02 DIAGNOSIS — G4700 Insomnia, unspecified: Secondary | ICD-10-CM

## 2014-03-02 MED ORDER — HYDROCODONE-ACETAMINOPHEN 10-325 MG PO TABS: ORAL_TABLET | ORAL | Status: DC

## 2014-03-02 MED ORDER — ZOLPIDEM TARTRATE 10 MG PO TABS: ORAL_TABLET | ORAL | Status: DC

## 2014-03-02 MED ORDER — PANTOPRAZOLE SODIUM 40 MG PO TBEC: DELAYED_RELEASE_TABLET | ORAL | Status: DC

## 2014-03-02 MED ORDER — TRAMADOL HCL 50 MG PO TABS: 50.0000 mg | ORAL_TABLET | Freq: Four times a day (QID) | ORAL | Status: DC | PRN

## 2014-03-02 MED ORDER — LISINOPRIL-HYDROCHLOROTHIAZIDE 20-12.5 MG PO TABS: 1.0000 | ORAL_TABLET | Freq: Every day | ORAL | Status: DC

## 2014-03-02 MED ORDER — ZOLPIDEM TARTRATE 5 MG PO TABS: ORAL_TABLET | ORAL | Status: DC

## 2014-03-02 NOTE — Progress Notes (Signed)
   Subjective:    Patient ID: Bryan Gilbert, male    DOB: July 10, 1965, 48 y.o.   MRN: [id number]  Hypertension This is a chronic problem. The current episode started more than 1 year ago. There are no compliance problems.    Requesting refill on hydrocodone, tramadol, ambien Refill on pantoprazole lisinopril/hctz 90 day   This patient was seen today for chronic pain  The medication list was reviewed and updated.   -Compliance with pain medication: He states takes between 4 and 6 per day  The patient was advised the importance of maintaining medication and not using illegal substances with these.  Refills needed: Yes  The patient was educated that we can provide 3 monthly scripts for their medication, it is their responsibility to follow the instructions.  Side effects or complications from medications: He denies any side effects  Patient is aware that pain medications are meant to minimize the severity of the pain to allow their pain levels to improve to allow for better function. They are aware of that pain medications cannot totally remove their pain.  Due for UDT ( at least once per year) : Later this year  He denies any current chest tightness pressure pain shortness breath nausea vomiting. States abdominal discomfort going well.     Review of Systems     Objective:   Physical Exam  Lungs are clear heart is regular pulse normal blood pressure extremities no edema cervical spine subjective discomfort in the spine      Assessment & Plan:  #1 chronic pain control-refills of hydrocodone were given not to exceed 6 per day has chronic neck pain. 3 prescriptions given. He was warned about over usage he is also warned about taking narcotics can depressed respiratory drive  #2 insomnia Ambien 10 mg warned about not to use with hydrocodone  #3 HTN stable reduce Salt intake. Try to stay physically active.  #4 needs lipid profile encourage patient to eat a healthy diet  #5  abdominal migraines follows through with specialists uses Stadol rarely. He was warned about the addictive potential

## 2014-03-13 ENCOUNTER — Telehealth: Payer: Self-pay | Admitting: Family Medicine

## 2014-03-13 NOTE — Telephone Encounter (Signed)
Patient would like Rx for zoloft.    [facility]

## 2014-03-13 NOTE — Telephone Encounter (Signed)
Nurses, if possible in this particular situation discussed with patient. I am fine with starting Zoloft 100 mg 1 daily, the first week he should do a half a tablet daily for 7 days this minimizes the potential for nausea with the medicine. #30 tablets, no refill, it is standard and customary for followup of patient within 3-4 weeks of starting a medication like this. It is also standard and customary for the patient and that should they start feeling severely depressed or suicidal to immediately contact us or the ER for help

## 2014-03-13 NOTE — Telephone Encounter (Signed)
Patients spouse said that Joe spoke with the doctor about this last week and was told to call back if he decided to go through with it.

## 2014-03-14 MED ORDER — SERTRALINE HCL 100 MG PO TABS: 100.0000 mg | ORAL_TABLET | Freq: Every day | ORAL | Status: DC

## 2014-03-14 NOTE — Telephone Encounter (Signed)
Notified wife Wells Guiles) patient can start Zoloft 100 mg 1 daily, the first week he should do a half a tablet daily for 7 days this minimizes the potential for nausea with the medicine. #30 tablets, no refill, it is standard and customary for followup of patient within 3-4 weeks of starting a medication like this. It is also standard and customary for the patient and that should they start feeling severely depressed or suicidal to immediately contact us or the ER for help. Medication sent to pharmacy. Wife verbalized understanding.

## 2014-04-06 ENCOUNTER — Telehealth: Payer: Self-pay | Admitting: Family Medicine

## 2014-04-06 MED ORDER — SERTRALINE HCL 100 MG PO TABS: 100.0000 mg | ORAL_TABLET | Freq: Every day | ORAL | Status: DC

## 2014-04-06 NOTE — Telephone Encounter (Signed)
Ok lets 

## 2014-04-06 NOTE — Telephone Encounter (Signed)
Patient is out of town in [place] and has left his zoloft at home. His spouse has requested that [facility] [place] so that it can be transferred to a pharmacy in [place].   [facility]

## 2014-04-06 NOTE — Telephone Encounter (Signed)
Last seen 8.28.15.

## 2014-04-06 NOTE — Telephone Encounter (Signed)
Rx sent electronically to pharmacy. Patient notified. 

## 2014-05-10 ENCOUNTER — Ambulatory Visit (INDEPENDENT_AMBULATORY_CARE_PROVIDER_SITE_OTHER): Payer: Managed Care, Other (non HMO) | Admitting: Family Medicine

## 2014-05-10 ENCOUNTER — Encounter: Payer: Self-pay | Admitting: Family Medicine

## 2014-05-10 VITALS — BP 116/86 | Temp 98.8°F | Ht 66.0 in | Wt 221.0 lb

## 2014-05-10 DIAGNOSIS — G8929 Other chronic pain: Secondary | ICD-10-CM

## 2014-05-10 DIAGNOSIS — M5412 Radiculopathy, cervical region: Secondary | ICD-10-CM

## 2014-05-10 MED ORDER — HYDROCODONE-ACETAMINOPHEN 10-325 MG PO TABS: ORAL_TABLET | ORAL | Status: DC

## 2014-05-10 MED ORDER — SERTRALINE HCL 100 MG PO TABS: 150.0000 mg | ORAL_TABLET | Freq: Every day | ORAL | Status: DC

## 2014-05-10 MED ORDER — AMOXICILLIN-POT CLAVULANATE 875-125 MG PO TABS: 1.0000 | ORAL_TABLET | Freq: Two times a day (BID) | ORAL | Status: DC

## 2014-05-10 NOTE — Progress Notes (Signed)
   Subjective:    Patient ID: Bryan Gilbert, male    DOB: [birth year], 48 y.o.   MRN: [id number]  HPI This patient was seen today for chronic pain  The medication list was reviewed and updated.   -Compliance with pain medication: yes  The patient was advised the importance of maintaining medication and not using illegal substances with these.  Refills needed: yes  The patient was educated that we can provide 3 monthly scripts for their medication, it is their responsibility to follow the instructions.  Side effects or complications from medications: none  Patient is aware that pain medications are meant to minimize the severity of the pain to allow their pain levels to improve to allow for better function. They are aware of that pain medications cannot totally remove their pain.  Due for UDT ( at least once per year) : next visit  Sinus pressure and headache. Started Saturday.    Declines flu vaccine.     Review of Systems Denies any chest pressure tightness pain shortness breath does relate sinus pressure    Objective:   Physical Exam Mares are dry eardrums normal throat is normal neck supple lungs clear heart regular neurologic grossly normal       Assessment & Plan:  Stress level stable using Zoloft 150 mg daily Chronic pain 3 prescriptions given follow-up in mid to late February  [place] drug registry was reviewed Exercise healthy diet recommended

## 2014-08-28 ENCOUNTER — Encounter: Payer: Self-pay | Admitting: Family Medicine

## 2014-08-28 ENCOUNTER — Ambulatory Visit (INDEPENDENT_AMBULATORY_CARE_PROVIDER_SITE_OTHER): Payer: Managed Care, Other (non HMO) | Admitting: Family Medicine

## 2014-08-28 VITALS — BP 126/88 | Ht 66.0 in | Wt 222.0 lb

## 2014-08-28 DIAGNOSIS — R739 Hyperglycemia, unspecified: Secondary | ICD-10-CM

## 2014-08-28 DIAGNOSIS — Z7982 Long term (current) use of aspirin: Secondary | ICD-10-CM

## 2014-08-28 DIAGNOSIS — R1115 Cyclical vomiting syndrome unrelated to migraine: Secondary | ICD-10-CM

## 2014-08-28 DIAGNOSIS — M5412 Radiculopathy, cervical region: Secondary | ICD-10-CM

## 2014-08-28 DIAGNOSIS — G4700 Insomnia, unspecified: Secondary | ICD-10-CM

## 2014-08-28 DIAGNOSIS — Z13220 Encounter for screening for lipoid disorders: Secondary | ICD-10-CM

## 2014-08-28 DIAGNOSIS — G43A1 Cyclical vomiting, intractable: Secondary | ICD-10-CM

## 2014-08-28 DIAGNOSIS — I10 Essential (primary) hypertension: Secondary | ICD-10-CM

## 2014-08-28 DIAGNOSIS — Z79899 Other long term (current) drug therapy: Secondary | ICD-10-CM

## 2014-08-28 DIAGNOSIS — G8929 Other chronic pain: Secondary | ICD-10-CM

## 2014-08-28 MED ORDER — TRAMADOL HCL 50 MG PO TABS: 50.0000 mg | ORAL_TABLET | Freq: Four times a day (QID) | ORAL | Status: DC | PRN

## 2014-08-28 MED ORDER — ZOLPIDEM TARTRATE 10 MG PO TABS: ORAL_TABLET | ORAL | Status: DC

## 2014-08-28 MED ORDER — HYDROCODONE-ACETAMINOPHEN 10-325 MG PO TABS: ORAL_TABLET | ORAL | Status: DC

## 2014-08-28 MED ORDER — SERTRALINE HCL 100 MG PO TABS: 200.0000 mg | ORAL_TABLET | Freq: Every day | ORAL | Status: DC

## 2014-08-28 NOTE — Patient Instructions (Signed)

## 2014-08-28 NOTE — Progress Notes (Signed)
Subjective:    Patient ID: Bryan Gilbert, male    DOB: 1965-07-22, 49 y.o.   MRN: [id number]  HPI This patient was seen today for chronic pain  The medication list was reviewed and updated.   -Compliance with pain medication: yes  The patient was advised the importance of maintaining medication and not using illegal substances with these.  Refills needed: yes on hydrocodone, ambien, and tramadol.   The patient was educated that we can provide 3 monthly scripts for their medication, it is their responsibility to follow the instructions.  Side effects or complications from medications: none  Patient is aware that pain medications are meant to minimize the severity of the pain to allow their pain levels to improve to allow for better function. They are aware of that pain medications cannot totally remove their pain.  Due for UDT ( at least once per year) : next visit  Wants to change diazepam 5mg . It is not helping.   Wants to increase zoloft 100mg . He takes one and a half tab a day now.    Requesting Dr. Nicki Reaper to start prescribing stadol nasal spray.   Needs refill on hydrocodne, tramadol, and ambien.  This patient does have cyclical vomiting syndrome he sees a neurologist for this. He was prescribing Stadol nasal spray and that seems to be the only thing truly helps when he gets these attacks. He uses Stadol nasal spray to avoid having to go to the ER. He will use Zofran along with Stadol nasal spray. He denies abusing it. States a bottle last him at least 9 months. He relates that the neurologist told the patient that he should consider having that medication prescribed by [place] since we prescribed his other medicine. Review of Systems Patient denies medication causing drowsiness. Patient also relates that he is not having any chest tightness shortness of breath denies current nausea vomiting. Denies diarrhea or rectal bleeding or hematuria. He does relate neck pain and chronic back  pain.    Objective:   Physical Exam Lungs are clear hearts regular pulse normal blood pressure good abdomen soft extremities no edema neurologic gross normal subjective pain in his neck. No obvious weakness other than what the discomfort cause.       Assessment & Plan:  1. Intractable cyclical vomiting with nausea This patient has this is ongoing problem flares up 2-3 times per year he uses the Stadol spray when necessary to help keep it under better control. Patient denies any other [place] trouble denies abusing it. Up until now his neurologist was prescribing this we will be taking over the prescription of this but he will be using it to where one bottle typically last 6-9 months. He understands the high addictive nature of this medicine. I was very clear with the patient that he should not be taking the pain pills along with the Stadol spray on at the same time. Also advised the patient at [facility] of the medication could cause his breathing slowed down and cause him to stop breathing which could result in death. He understood this. He voiced proper use of medication. He also stated that if this medicine did not relieve his headache he would go to the emergency department.  2. Essential hypertension Blood pressure stable but he does need to do a regular exercise. He is to follow-up in 3-4 months watch diet closely minimize salt - Basic metabolic panel  3. Chronic radicular cervical pain This patient has chronic pain uses 5-6  pain medications per day denies a causing drowsiness states rarely uses muscle relaxers.  4. Hyperglycemia Has history hyperglycemia check A1c - Hemoglobin A1c  5. Insomnia He uses medication for insomnia I told him that we really can't increase the dose of that. He is having some stress related issues but denies being depressed but he would like to go on a higher dose of Zoloft we will increase it to 200 mg daily  6. Aspirin long-term use Because of being on  low-dose aspirin to prevent strokes and heart attacks we will check CBC - CBC with Differential/Platelet  7. High risk medication use Because of multiple medication check liver function - Hepatic function panel  8. Need for lipid screening Because a history hyperlipidemia check lipid profile - Lipid panel

## 2014-08-29 ENCOUNTER — Other Ambulatory Visit: Payer: Self-pay | Admitting: Family Medicine

## 2014-11-27 ENCOUNTER — Ambulatory Visit (INDEPENDENT_AMBULATORY_CARE_PROVIDER_SITE_OTHER): Payer: Managed Care, Other (non HMO) | Admitting: Family Medicine

## 2014-11-27 ENCOUNTER — Encounter: Payer: Self-pay | Admitting: Family Medicine

## 2014-11-27 VITALS — BP 132/82 | Ht 66.0 in | Wt 223.2 lb

## 2014-11-27 DIAGNOSIS — I10 Essential (primary) hypertension: Secondary | ICD-10-CM

## 2014-11-27 DIAGNOSIS — G8929 Other chronic pain: Secondary | ICD-10-CM | POA: Diagnosis not present

## 2014-11-27 DIAGNOSIS — G43A1 Cyclical vomiting, intractable: Secondary | ICD-10-CM

## 2014-11-27 DIAGNOSIS — M5412 Radiculopathy, cervical region: Secondary | ICD-10-CM

## 2014-11-27 DIAGNOSIS — Z5189 Encounter for other specified aftercare: Secondary | ICD-10-CM | POA: Diagnosis not present

## 2014-11-27 DIAGNOSIS — R52 Pain, unspecified: Secondary | ICD-10-CM

## 2014-11-27 DIAGNOSIS — R1115 Cyclical vomiting syndrome unrelated to migraine: Secondary | ICD-10-CM

## 2014-11-27 MED ORDER — CYCLOBENZAPRINE HCL 10 MG PO TABS: 10.0000 mg | ORAL_TABLET | Freq: Three times a day (TID) | ORAL | Status: DC | PRN

## 2014-11-27 MED ORDER — HYDROCODONE-ACETAMINOPHEN 10-325 MG PO TABS: ORAL_TABLET | ORAL | Status: DC

## 2014-11-27 MED ORDER — HYDROCODONE-ACETAMINOPHEN 10-325 MG PO TABS
ORAL_TABLET | ORAL | Status: DC
Start: 2014-11-27 — End: 2014-11-27

## 2014-11-27 NOTE — Progress Notes (Signed)
   Subjective:    Patient ID: Bryan Gilbert, male    DOB: Nov 21, 1965, 49 y.o.   MRN: [id number]  HPI This patient was seen today for chronic pain  The medication list was reviewed and updated.   -Compliance with pain medication: yes  The patient was advised the importance of maintaining medication and not using illegal substances with these.  Refills needed: yes  The patient was educated that we can provide 3 monthly scripts for their medication, it is their responsibility to follow the instructions.  Side effects or complications from medications: none  Patient is aware that pain medications are meant to minimize the severity of the pain to allow their pain levels to improve to allow for better function. They are aware of that pain medications cannot totally remove their pain.  Due for UDT ( at least once per year) : Today        Review of Systems Patient denies chest tightness pressure pain shortness breath nausea vomiting diarrhea    Objective:   Physical Exam  Lungs are clear hearts regular pulse normal abdomen soft extremities no edema skin warm dry      Assessment & Plan:  1. Pain management Will do urine drug screen today. In addition to this refills of his pain medicine given 1 prescription given to scripts on hold - ToxASSURE Select 13 (MW), Urine  2. Chronic radicular cervical pain Patient takes pain medicine anywhere between 4-6 times per day denies a causing drowsiness states it makes the pain more bearable to where he can function  3. Essential hypertension Blood pressure under good control continue current measures  4. Intractable cyclical vomiting with nausea The patient occasionally has to get Stadol nasal spray so far his neurologist has been prescribing this but it is possible that they may shift that back to our direction. He uses this very infrequently and it helps keep him out of the emergency department when he gets hit with this illness.  Long  discussion was held regarding how pain medications if taken too many at one time or taken at the same time of Stadol could increase the risk of stopping breathing and death he understands this he states he will take medicine responsibly and will try to back down when possible. Follow-up 3 months

## 2014-11-27 NOTE — Patient Instructions (Signed)

## 2014-12-04 LAB — TOXASSURE SELECT 13 (MW), URINE: PDF: 0

## 2014-12-07 ENCOUNTER — Other Ambulatory Visit: Payer: Self-pay | Admitting: *Deleted

## 2014-12-07 DIAGNOSIS — G8929 Other chronic pain: Secondary | ICD-10-CM

## 2014-12-14 LAB — TOXASSURE SELECT 13 (MW), URINE: PDF: 0

## 2015-02-18 ENCOUNTER — Telehealth: Payer: Self-pay | Admitting: Family Medicine

## 2015-02-18 MED ORDER — HYDROCODONE-ACETAMINOPHEN 10-325 MG PO TABS: ORAL_TABLET | ORAL | Status: DC

## 2015-02-18 NOTE — Telephone Encounter (Signed)
It is fine to go ahead with a refill. Please try to verify when his last prescription was filled at the pharmacy. Date this one 30 days later per protocol

## 2015-02-18 NOTE — Telephone Encounter (Signed)
Notified Wells Guiles (wife) that script is ready for pickup.

## 2015-02-18 NOTE — Telephone Encounter (Signed)
Pt is needing a refill on his hydrocodone. Pt has an appt on 9/8 at 2pm

## 2015-03-03 ENCOUNTER — Telehealth: Payer: Self-pay | Admitting: Family Medicine

## 2015-03-03 ENCOUNTER — Encounter: Payer: Self-pay | Admitting: Family Medicine

## 2015-03-03 NOTE — Telephone Encounter (Signed)
A letter was sent to the patient regarding how we will need to discuss other options for sleep as well as other options for muscle relaxers in the importance of minimizing the use of muscle relaxers with pain medications.

## 2015-03-04 ENCOUNTER — Ambulatory Visit (INDEPENDENT_AMBULATORY_CARE_PROVIDER_SITE_OTHER): Payer: Managed Care, Other (non HMO) | Admitting: Family Medicine

## 2015-03-04 ENCOUNTER — Encounter: Payer: Self-pay | Admitting: Family Medicine

## 2015-03-04 VITALS — BP 126/88 | Temp 98.7°F | Ht 66.0 in | Wt 232.2 lb

## 2015-03-04 DIAGNOSIS — J019 Acute sinusitis, unspecified: Secondary | ICD-10-CM

## 2015-03-04 DIAGNOSIS — B9689 Other specified bacterial agents as the cause of diseases classified elsewhere: Secondary | ICD-10-CM

## 2015-03-04 MED ORDER — NEOMYCIN-POLYMYXIN-HC 3.5-10000-1 OT SOLN: 4.0000 [drp] | Freq: Four times a day (QID) | OTIC | Status: AC

## 2015-03-04 MED ORDER — AMOXICILLIN-POT CLAVULANATE 875-125 MG PO TABS: 1.0000 | ORAL_TABLET | Freq: Two times a day (BID) | ORAL | Status: DC

## 2015-03-04 NOTE — Progress Notes (Signed)
   Subjective:    Patient ID: Bryan Gilbert, male    DOB: 1965-11-07, 49 y.o.   MRN: [id number]  Sinusitis This is a new problem. The current episode started in the past 7 days. The problem has been gradually worsening since onset. There has been no fever. The pain is moderate. Associated symptoms include ear pain, neck pain and sinus pressure. Pertinent negatives include no congestion or coughing. Treatments tried: sudafed, ear drops. The treatment provided mild relief.   Patient states no other concerns this visit.   Review of Systems  Constitutional: Negative for activity change, appetite change and fatigue.  HENT: Positive for ear pain and sinus pressure. Negative for congestion.   Respiratory: Negative for cough.   Cardiovascular: Negative for chest pain.  Gastrointestinal: Negative for abdominal pain.  Endocrine: Negative for polydipsia and polyphagia.  Musculoskeletal: Positive for neck pain.  Neurological: Negative for weakness.  Psychiatric/Behavioral: Negative for confusion.       Objective:   Physical Exam  Constitutional: He appears well-nourished. No distress.  Cardiovascular: Normal rate, regular rhythm and normal heart sounds.   No murmur heard. Pulmonary/Chest: Effort normal and breath sounds normal. No respiratory distress.  Musculoskeletal: He exhibits no edema.  Lymphadenopathy:    He has no cervical adenopathy.  Neurological: He is alert.  Psychiatric: His behavior is normal.  Vitals reviewed.         Assessment & Plan:  Probable sinusitis more than likely frontal sinusitis if it does not improve with the antibiotics and allergy medicine may need other testing.  I did let the patient know that we are sending him a letter that we need to discuss his medication regimen to try to find the safest combination possible. Unfortunately this patient has combination of severe chronic pain as well as insomnia and stress related issues

## 2015-03-14 ENCOUNTER — Encounter: Payer: Self-pay | Admitting: Family Medicine

## 2015-03-14 ENCOUNTER — Ambulatory Visit (INDEPENDENT_AMBULATORY_CARE_PROVIDER_SITE_OTHER): Payer: Managed Care, Other (non HMO) | Admitting: Family Medicine

## 2015-03-14 VITALS — BP 122/88 | Ht 66.0 in | Wt 234.0 lb

## 2015-03-14 DIAGNOSIS — Z13220 Encounter for screening for lipoid disorders: Secondary | ICD-10-CM | POA: Diagnosis not present

## 2015-03-14 DIAGNOSIS — R739 Hyperglycemia, unspecified: Secondary | ICD-10-CM

## 2015-03-14 DIAGNOSIS — I10 Essential (primary) hypertension: Secondary | ICD-10-CM

## 2015-03-14 DIAGNOSIS — R5383 Other fatigue: Secondary | ICD-10-CM | POA: Diagnosis not present

## 2015-03-14 DIAGNOSIS — G8929 Other chronic pain: Secondary | ICD-10-CM

## 2015-03-14 DIAGNOSIS — R740 Nonspecific elevation of levels of transaminase and lactic acid dehydrogenase [LDH]: Secondary | ICD-10-CM

## 2015-03-14 DIAGNOSIS — Z125 Encounter for screening for malignant neoplasm of prostate: Secondary | ICD-10-CM

## 2015-03-14 DIAGNOSIS — R7401 Elevation of levels of liver transaminase levels: Secondary | ICD-10-CM

## 2015-03-14 DIAGNOSIS — M5412 Radiculopathy, cervical region: Secondary | ICD-10-CM

## 2015-03-14 MED ORDER — HYDROCODONE-ACETAMINOPHEN 10-325 MG PO TABS: ORAL_TABLET | ORAL | Status: DC

## 2015-03-14 MED ORDER — SERTRALINE HCL 100 MG PO TABS: ORAL_TABLET | ORAL | Status: DC

## 2015-03-14 MED ORDER — DIAZEPAM 5 MG PO TABS: 5.0000 mg | ORAL_TABLET | Freq: Four times a day (QID) | ORAL | Status: DC | PRN

## 2015-03-14 MED ORDER — TRAMADOL HCL 50 MG PO TABS: 50.0000 mg | ORAL_TABLET | Freq: Four times a day (QID) | ORAL | Status: DC | PRN

## 2015-03-14 MED ORDER — CHLORZOXAZONE 500 MG PO TABS: 500.0000 mg | ORAL_TABLET | Freq: Three times a day (TID) | ORAL | Status: DC | PRN

## 2015-03-14 NOTE — Patient Instructions (Signed)

## 2015-03-14 NOTE — Progress Notes (Signed)
Subjective:    Patient ID: Bryan Gilbert, male    DOB: 09-Dec-1965, 49 y.o.   MRN: [id number]  HPI This patient was seen today for chronic pain. Pt states pain is in back of neck. Med does help.   The medication list was reviewed and updated.   -Compliance with pain medication: yes  The patient was advised the importance of maintaining medication and not using illegal substances with these.  Refills needed: yes  The patient was educated that we can provide 3 monthly scripts for their medication, it is their responsibility to follow the instructions.  Side effects or complications from medications: none  Patient is aware that pain medications are meant to minimize the severity of the pain to allow their pain levels to improve to allow for better function. They are aware of that pain medications cannot totally remove their pain.  Due for UDT ( at least once per year) : done June 2016  Pt has cut back on dose of zoloft and protonix.  Patient states his moods are doing better He states reflux not bothering him as much He states he still has severe pain in his neck and radiates down the left arm. Specialist states someday he'll need surgery but not just yet He still has cyclical vomiting syndrome that Stadol helps but he only uses this every few months Patient also relates at times panic attacks for which he uses Valium but he avoids taking with his pain medicine He also has chronic anxiety insomnia for which he uses Ambien on a when necessary basis.     Review of Systems  Constitutional: Negative for activity change.  Gastrointestinal: Negative for vomiting and abdominal pain.  Neurological: Negative for weakness.  Psychiatric/Behavioral: Negative for confusion.       Objective:   Physical Exam  Constitutional: He appears well-nourished.  Cardiovascular: Normal rate, regular rhythm and normal heart sounds.   No murmur heard. Pulmonary/Chest: Effort normal and breath sounds  normal.  Musculoskeletal: He exhibits no edema.  Lymphadenopathy:    He has no cervical adenopathy.  Neurological: He is alert.  Psychiatric: His behavior is normal.  Vitals reviewed.  Greater than 25 minutes spent with this patient covering the multiple issues that are listed below. Significant complexity and the discussion and questions were answered.       Assessment & Plan:  Chronic headaches-she uses tramadol for this. He relates he uses it rarely. Chronic insomnia uses Ambien to help sleep. Sleep apnea uses CPAP on a regular basis but still has some tiredness during the day Has a history of low testosterone but does not one to do any type of replacement therapy before when he did replacement therapy he had polycythemia Chronic pain and discomfort with cervical issues in his neck as well as his lower back does not want surgery currently uses pain medication anywhere between 3 and 6 times per day states it does not cause drowsiness Chronic anxiety issues uses Valium occasionally when he has severe panic attacks during the day denies using this at night and he avoids taking it with pain medication Patient being seen by specialist for cyclical vomiting spells that seems to be only successfully treated with Stadol. He is highly aware that Stadol is addicted. He knows not to miss use it. He states he only uses it maybe once every few months. He knows not to take his pain medicine or Valium with this medicine I encouraged patient to stop taking Flexeril. I told  him that Parafon forte would be the only muscle relaxer he could use and even then he should only uses it occasionally He states his moods are overall doing better he is cut back on his Zoloft 50-100 mg a day   GERD-she states overall he is doing much better is using this medicine on a when necessary basis.

## 2015-03-27 ENCOUNTER — Other Ambulatory Visit: Payer: Self-pay | Admitting: Family Medicine

## 2015-03-27 NOTE — Telephone Encounter (Signed)
Last seen 03/14/15

## 2015-03-27 NOTE — Telephone Encounter (Signed)
Ok 6 mo worth 

## 2015-04-10 ENCOUNTER — Other Ambulatory Visit: Payer: Self-pay | Admitting: *Deleted

## 2015-04-10 MED ORDER — SERTRALINE HCL 100 MG PO TABS: ORAL_TABLET | ORAL | Status: DC

## 2015-04-22 ENCOUNTER — Other Ambulatory Visit: Payer: Self-pay | Admitting: Family Medicine

## 2015-06-18 ENCOUNTER — Ambulatory Visit (INDEPENDENT_AMBULATORY_CARE_PROVIDER_SITE_OTHER): Payer: Managed Care, Other (non HMO) | Admitting: Family Medicine

## 2015-06-18 ENCOUNTER — Encounter: Payer: Self-pay | Admitting: Family Medicine

## 2015-06-18 VITALS — BP 110/80 | Ht 66.0 in | Wt 231.5 lb

## 2015-06-18 DIAGNOSIS — J019 Acute sinusitis, unspecified: Secondary | ICD-10-CM

## 2015-06-18 DIAGNOSIS — R1115 Cyclical vomiting syndrome unrelated to migraine: Secondary | ICD-10-CM

## 2015-06-18 DIAGNOSIS — G4733 Obstructive sleep apnea (adult) (pediatric): Secondary | ICD-10-CM

## 2015-06-18 DIAGNOSIS — B9689 Other specified bacterial agents as the cause of diseases classified elsewhere: Secondary | ICD-10-CM

## 2015-06-18 DIAGNOSIS — G43A1 Cyclical vomiting, intractable: Secondary | ICD-10-CM | POA: Diagnosis not present

## 2015-06-18 DIAGNOSIS — G4700 Insomnia, unspecified: Secondary | ICD-10-CM | POA: Diagnosis not present

## 2015-06-18 DIAGNOSIS — I10 Essential (primary) hypertension: Secondary | ICD-10-CM

## 2015-06-18 MED ORDER — SERTRALINE HCL 100 MG PO TABS: ORAL_TABLET | ORAL | Status: DC

## 2015-06-18 MED ORDER — MELOXICAM 15 MG PO TABS: ORAL_TABLET | ORAL | Status: DC

## 2015-06-18 MED ORDER — HYDROCODONE-ACETAMINOPHEN 10-325 MG PO TABS: ORAL_TABLET | ORAL | Status: DC

## 2015-06-18 MED ORDER — CEFPROZIL 500 MG PO TABS: 500.0000 mg | ORAL_TABLET | Freq: Two times a day (BID) | ORAL | Status: DC

## 2015-06-18 NOTE — Progress Notes (Signed)
Subjective:    Patient ID: Bryan Gilbert, male    DOB: October 09, 1965, 49 y.o.   MRN: [id number]  HPI This patient was seen today for chronic pain  The medication list was reviewed and updated.   -Compliance with medication: yes  - Number patient states they take daily: 6 daily  -when was the last dose patient took: this morning   The patient was advised the importance of maintaining medication and not using illegal substances with these.  Refills needed: yes  The patient was educated that we can provide 3 monthly scripts for their medication, it is their responsibility to follow the instructions.  Side effects or complications from medications: none  Patient is aware that pain medications are meant to minimize the severity of the pain to allow their pain levels to improve to allow for better function. They are aware of that pain medications cannot totally remove their pain.  Due for UDT ( at least once per year) : up to date   Patient has no concerns at this time.  Patient relates chronic neck pain radiates into both arms mainly in the shoulder regions medications takes the edge off and never told he takes it away he does do some injections his specialist told him there really wasn't much surgery could do for him. Patient understands the importance of not combining his medications together especially at bedtime because of the risk of overdosing he uses a Valium only occasionally for muscle spasms and only during the day he uses his Stadol nasal spray only during times of cyclical vomiting syndrome and does not take hydrocodone when he has to use the nasal spray patient states that he knows he should he got his lab work done but just hasn't he relates he is taking his blood pressure medicine and trying to watch his diet to some degree    Review of Systems  Constitutional: Negative for activity change.  Gastrointestinal: Negative for vomiting and abdominal pain.  Neurological: Negative  for weakness.  Psychiatric/Behavioral: Negative for confusion.       Objective:   Physical Exam  Constitutional: He appears well-nourished.  Cardiovascular: Normal rate, regular rhythm and normal heart sounds.   No murmur heard. Pulmonary/Chest: Effort normal and breath sounds normal.  Musculoskeletal: He exhibits no edema.  Lymphadenopathy:    He has no cervical adenopathy.  Neurological: He is alert.  Psychiatric: His behavior is normal.  Vitals reviewed.    25 minutes was spent with the patient. Greater than half the time was spent in discussion and answering questions and counseling regarding the issues that the patient came in for today.      Assessment & Plan:  Chronic pain discomfort in his neck recently got a shot uses hydrocodone between 5 and 6 per day denies a causing him drowsiness denies abusing it. Patient also has chronic cyclical vomiting syndrome for which he uses Stadol nasal spray from his neurologist at [facility]. Apparently this does help him Intermittent headaches he states he takes Tylenol for the wonders if there is something stronger he can take I do not recommend tramadol because of the Zoloft he is on he may try meloxicam on a when necessary basis Patient was counseled regarding the importance of doing his lab work to monitor his liver kidneys and sugar he was instructed to get this done in the near future Has history of low testosterone but I do not recommend treatment of this because of increased risk of heart  disease as well as he had polycythemia every time he was treated in the past Patient tends to go to bed 3 AM because of his insomnia but then sleeps until 9 or 10 states he's reduced his Ambien down to 5 mg per night He has an early sinus infection antibiotics prescribed but a he will see filled turn the corner on its own

## 2015-06-18 NOTE — Patient Instructions (Signed)

## 2015-09-17 ENCOUNTER — Ambulatory Visit (INDEPENDENT_AMBULATORY_CARE_PROVIDER_SITE_OTHER): Payer: Managed Care, Other (non HMO) | Admitting: Family Medicine

## 2015-09-17 ENCOUNTER — Encounter: Payer: Self-pay | Admitting: Family Medicine

## 2015-09-17 VITALS — BP 112/82 | Ht 66.0 in | Wt 223.0 lb

## 2015-09-17 DIAGNOSIS — G8929 Other chronic pain: Secondary | ICD-10-CM | POA: Diagnosis not present

## 2015-09-17 DIAGNOSIS — M5412 Radiculopathy, cervical region: Secondary | ICD-10-CM | POA: Diagnosis not present

## 2015-09-17 DIAGNOSIS — I10 Essential (primary) hypertension: Secondary | ICD-10-CM

## 2015-09-17 DIAGNOSIS — R1084 Generalized abdominal pain: Secondary | ICD-10-CM

## 2015-09-17 DIAGNOSIS — Z13220 Encounter for screening for lipoid disorders: Secondary | ICD-10-CM

## 2015-09-17 DIAGNOSIS — Z125 Encounter for screening for malignant neoplasm of prostate: Secondary | ICD-10-CM | POA: Diagnosis not present

## 2015-09-17 DIAGNOSIS — R5383 Other fatigue: Secondary | ICD-10-CM

## 2015-09-17 MED ORDER — HYDROCODONE-ACETAMINOPHEN 10-325 MG PO TABS: ORAL_TABLET | ORAL | Status: DC

## 2015-09-17 NOTE — Progress Notes (Signed)
   Subjective:    Patient ID: Bryan Gilbert, male    DOB: 1965/12/20, 50 y.o.   MRN: [id number]  HPI This patient was seen today for chronic pain  The medication list was reviewed and updated.   -Compliance with medication: Takes as prescribed   - Number patient states they take daily: Takes 6 tablets daily.  -when was the last dose patient took? This morning  The patient was advised the importance of maintaining medication and not using illegal substances with these.  Refills needed: Yes  The patient was educated that we can provide 3 monthly scripts for their medication, it is their responsibility to follow the instructions.  Side effects or complications from medications: None  Patient is aware that pain medications are meant to minimize the severity of the pain to allow their pain levels to improve to allow for better function. They are aware of that pain medications cannot totally remove their pain.  Due for UDT ( at least once per year) : 06/032016  States no other concerns this visit.  This patient states he has ongoing pain in his neck he has an injection coming up seeing he states his cyclical vomiting syndrome is under good control has not had use Stadol nasal spray recently. Has had significant trouble with irritable bowel of slight intermittent aching in the abdomen. Denies any blood in his stools. States it occurred after being on an antibiotic. He is taking a probiotic and denies any mucousy or bloody stools. States his bowel movements are formed.   Review of Systems  Constitutional: Negative for activity change.  Gastrointestinal: Negative for vomiting and abdominal pain.  Neurological: Negative for weakness.  Psychiatric/Behavioral: Negative for confusion.       Objective:   Physical Exam  Constitutional: He appears well-nourished.  Cardiovascular: Normal rate, regular rhythm and normal heart sounds.   No murmur heard. Pulmonary/Chest: Effort normal and  breath sounds normal.  Musculoskeletal: He exhibits no edema.  Lymphadenopathy:    He has no cervical adenopathy.  Neurological: He is alert.  Psychiatric: His behavior is normal.  Vitals reviewed.    The patient never did get his lab work. I told him is very important to get this done he stated he will get it done within the next 2-3 weeks     Assessment & Plan:  Chronic pain Injections her specialist Prescriptions for pain medicine given The patient was seen today as part of a comprehensive visit regarding pain control. Patient's compliance with the medication as well as discussion regarding effectiveness was completed. Prescriptions were written. Patient was advised to follow-up in 3 months. The patient was assessed for any signs of severe side effects. The patient was advised to take the medicine as directed and to report to [place] if any side effect issues. Patient was cautioned regarding his other medications, patient states he only uses half an Ambien at nighttime. Uses very little Valium.

## 2015-09-19 ENCOUNTER — Inpatient Hospital Stay (HOSPITAL_COMMUNITY)
Admission: EM | Admit: 2015-09-19 | Discharge: 2015-09-21 | DRG: 103 | Disposition: A | Discharge status: Home / Self Care | Payer: Managed Care, Other (non HMO) | Attending: Internal Medicine | Admitting: Internal Medicine

## 2015-09-19 ENCOUNTER — Observation Stay (HOSPITAL_COMMUNITY): Payer: Managed Care, Other (non HMO)

## 2015-09-19 ENCOUNTER — Encounter (HOSPITAL_COMMUNITY): Payer: Self-pay

## 2015-09-19 DIAGNOSIS — D751 Secondary polycythemia: Secondary | ICD-10-CM | POA: Diagnosis present

## 2015-09-19 DIAGNOSIS — I10 Essential (primary) hypertension: Secondary | ICD-10-CM | POA: Diagnosis not present

## 2015-09-19 DIAGNOSIS — E669 Obesity, unspecified: Secondary | ICD-10-CM | POA: Diagnosis present

## 2015-09-19 DIAGNOSIS — K760 Fatty (change of) liver, not elsewhere classified: Secondary | ICD-10-CM | POA: Diagnosis present

## 2015-09-19 DIAGNOSIS — R1115 Cyclical vomiting syndrome unrelated to migraine: Secondary | ICD-10-CM | POA: Diagnosis present

## 2015-09-19 DIAGNOSIS — R1012 Left upper quadrant pain: Secondary | ICD-10-CM | POA: Diagnosis not present

## 2015-09-19 DIAGNOSIS — Z8249 Family history of ischemic heart disease and other diseases of the circulatory system: Secondary | ICD-10-CM

## 2015-09-19 DIAGNOSIS — G43D0 Abdominal migraine, not intractable: Principal | ICD-10-CM | POA: Diagnosis present

## 2015-09-19 DIAGNOSIS — M792 Neuralgia and neuritis, unspecified: Secondary | ICD-10-CM | POA: Diagnosis present

## 2015-09-19 DIAGNOSIS — Z6834 Body mass index (BMI) 34.0-34.9, adult: Secondary | ICD-10-CM

## 2015-09-19 DIAGNOSIS — G43A0 Cyclical vomiting, not intractable: Secondary | ICD-10-CM | POA: Diagnosis present

## 2015-09-19 DIAGNOSIS — G43A1 Cyclical vomiting, intractable: Secondary | ICD-10-CM | POA: Diagnosis not present

## 2015-09-19 DIAGNOSIS — R109 Unspecified abdominal pain: Secondary | ICD-10-CM

## 2015-09-19 DIAGNOSIS — Z7951 Long term (current) use of inhaled steroids: Secondary | ICD-10-CM

## 2015-09-19 DIAGNOSIS — M5412 Radiculopathy, cervical region: Secondary | ICD-10-CM | POA: Diagnosis present

## 2015-09-19 DIAGNOSIS — Z7982 Long term (current) use of aspirin: Secondary | ICD-10-CM

## 2015-09-19 DIAGNOSIS — F1120 Opioid dependence, uncomplicated: Secondary | ICD-10-CM

## 2015-09-19 HISTORY — DX: Abdominal migraine, not intractable: G43.D0

## 2015-09-19 LAB — URINALYSIS, ROUTINE W REFLEX MICROSCOPIC
Bilirubin Urine: NEGATIVE
Glucose, UA: NEGATIVE mg/dL
Hgb urine dipstick: NEGATIVE
Ketones, ur: NEGATIVE mg/dL
Leukocytes, UA: NEGATIVE
Nitrite: NEGATIVE
Specific Gravity, Urine: >1.03 — ABNORMAL HIGH (ref 1.005–1.030)
pH: 5.5 (ref 5.0–8.0)

## 2015-09-19 LAB — CBC WITH DIFFERENTIAL/PLATELET
Basophils Absolute: 0 10*3/uL (ref 0.0–0.1)
Basophils Relative: 0 %
Eosinophils Absolute: 0.1 10*3/uL (ref 0.0–0.7)
Eosinophils Relative: 0 %
HCT: 49.6 % (ref 39.0–52.0)
Hemoglobin: 17.1 g/dL — ABNORMAL HIGH (ref 13.0–17.0)
Lymphocytes Relative: 33 %
Lymphs Abs: 3.8 10*3/uL (ref 0.7–4.0)
MCH: 30 pg (ref 26.0–34.0)
MCHC: 34.5 g/dL (ref 30.0–36.0)
MCV: 87 fL (ref 78.0–100.0)
Monocytes Absolute: 1 10*3/uL (ref 0.1–1.0)
Monocytes Relative: 9 %
Neutro Abs: 6.6 10*3/uL (ref 1.7–7.7)
Neutrophils Relative %: 58 %
Platelets: 397 10*3/uL (ref 150–400)
RBC: 5.7 MIL/uL (ref 4.22–5.81)
RDW: 12 % (ref 11.5–15.5)
WBC: 11.5 10*3/uL — ABNORMAL HIGH (ref 4.0–10.5)

## 2015-09-19 LAB — COMPREHENSIVE METABOLIC PANEL
ALT: 97 U/L — ABNORMAL HIGH (ref 17–63)
AST: 47 U/L — ABNORMAL HIGH (ref 15–41)
Albumin: 4.8 g/dL (ref 3.5–5.0)
Alkaline Phosphatase: 98 U/L (ref 38–126)
Anion gap: 10 (ref 5–15)
BUN: 19 mg/dL (ref 6–20)
CO2: 27 mmol/L (ref 22–32)
Calcium: 9.6 mg/dL (ref 8.9–10.3)
Chloride: 103 mmol/L (ref 101–111)
Creatinine, Ser: 1.13 mg/dL (ref 0.61–1.24)
GFR calc Af Amer: >60 mL/min (ref >60)
GFR calc non Af Amer: >60 mL/min (ref >60)
Glucose, Bld: 123 mg/dL — ABNORMAL HIGH (ref 65–99)
Potassium: 3.9 mmol/L (ref 3.5–5.1)
Sodium: 140 mmol/L (ref 135–145)
Total Bilirubin: 0.9 mg/dL (ref 0.3–1.2)
Total Protein: 8.8 g/dL — ABNORMAL HIGH (ref 6.5–8.1)

## 2015-09-19 LAB — RAPID URINE DRUG SCREEN, HOSP PERFORMED
Amphetamines: NOT DETECTED
Barbiturates: NOT DETECTED
Benzodiazepines: POSITIVE — AB
Cocaine: NOT DETECTED
Opiates: POSITIVE — AB
Tetrahydrocannabinol: NOT DETECTED

## 2015-09-19 LAB — URINE MICROSCOPIC-ADD ON
Squamous Epithelial / LPF: NONE SEEN
WBC, UA: NONE SEEN WBC/hpf (ref 0–5)

## 2015-09-19 LAB — LIPASE, BLOOD: Lipase: 22 U/L (ref 11–51)

## 2015-09-19 MED ORDER — METOCLOPRAMIDE HCL 5 MG/ML IJ SOLN
10.0000 mg | Freq: Once | INTRAMUSCULAR | Status: AC
  Administered 2015-09-19: 10 mg via INTRAVENOUS
  Filled 2015-09-19: qty 2

## 2015-09-19 MED ORDER — ONDANSETRON HCL 4 MG/2ML IJ SOLN: 4.0000 mg | Freq: Four times a day (QID) | INTRAMUSCULAR | Status: DC | PRN

## 2015-09-19 MED ORDER — BOOST / RESOURCE BREEZE PO LIQD
1.0000 | Freq: Three times a day (TID) | ORAL | Status: DC
  Administered 2015-09-19: 1 via ORAL

## 2015-09-19 MED ORDER — ONDANSETRON HCL 4 MG PO TABS: 4.0000 mg | ORAL_TABLET | Freq: Four times a day (QID) | ORAL | Status: DC | PRN

## 2015-09-19 MED ORDER — ACETAMINOPHEN 325 MG PO TABS: 650.0000 mg | ORAL_TABLET | Freq: Four times a day (QID) | ORAL | Status: DC | PRN

## 2015-09-19 MED ORDER — HALOPERIDOL LACTATE 5 MG/ML IJ SOLN
2.5000 mg | Freq: Once | INTRAMUSCULAR | Status: AC
  Administered 2015-09-19: 2.5 mg via INTRAVENOUS
  Filled 2015-09-19: qty 1

## 2015-09-19 MED ORDER — ACETAMINOPHEN 650 MG RE SUPP: 650.0000 mg | Freq: Four times a day (QID) | RECTAL | Status: DC | PRN

## 2015-09-19 MED ORDER — SODIUM CHLORIDE 0.9 % IV BOLUS (SEPSIS)
1000.0000 mL | Freq: Once | INTRAVENOUS | Status: AC
  Administered 2015-09-19: 1000 mL via INTRAVENOUS

## 2015-09-19 MED ORDER — MORPHINE SULFATE (PF) 2 MG/ML IV SOLN
1.0000 mg | INTRAVENOUS | Status: DC | PRN
Start: 2015-09-19 — End: 2015-09-19

## 2015-09-19 MED ORDER — KETOROLAC TROMETHAMINE 30 MG/ML IJ SOLN
30.0000 mg | Freq: Once | INTRAMUSCULAR | Status: AC
  Administered 2015-09-19: 30 mg via INTRAVENOUS
  Filled 2015-09-19: qty 1

## 2015-09-19 MED ORDER — ENOXAPARIN SODIUM 60 MG/0.6ML ~~LOC~~ SOLN
50.0000 mg | SUBCUTANEOUS | Status: DC
  Administered 2015-09-19 – 2015-09-21 (×3): 50 mg via SUBCUTANEOUS
  Filled 2015-09-19 (×3): qty 0.6

## 2015-09-19 MED ORDER — ALUM & MAG HYDROXIDE-SIMETH 200-200-20 MG/5ML PO SUSP: 30.0000 mL | Freq: Four times a day (QID) | ORAL | Status: DC | PRN

## 2015-09-19 MED ORDER — HYDROMORPHONE HCL 2 MG/ML IJ SOLN
2.0000 mg | Freq: Once | INTRAMUSCULAR | Status: AC
  Administered 2015-09-19: 2 mg via INTRAVENOUS
  Filled 2015-09-19: qty 1

## 2015-09-19 MED ORDER — IOHEXOL 300 MG/ML  SOLN
100.0000 mL | Freq: Once | INTRAMUSCULAR | Status: AC | PRN
  Administered 2015-09-19: 100 mL via INTRAVENOUS

## 2015-09-19 MED ORDER — DIPHENHYDRAMINE HCL 50 MG/ML IJ SOLN
25.0000 mg | Freq: Once | INTRAMUSCULAR | Status: AC
  Administered 2015-09-19: 25 mg via INTRAVENOUS
  Filled 2015-09-19: qty 1

## 2015-09-19 MED ORDER — HYDROMORPHONE HCL 1 MG/ML IJ SOLN
0.5000 mg | INTRAMUSCULAR | Status: DC | PRN
  Administered 2015-09-19 – 2015-09-21 (×11): 1 mg via INTRAVENOUS
  Filled 2015-09-19 (×12): qty 1

## 2015-09-19 MED ORDER — PANTOPRAZOLE SODIUM 40 MG IV SOLR: 40.0000 mg | Freq: Two times a day (BID) | INTRAVENOUS | Status: DC

## 2015-09-19 MED ORDER — SODIUM CHLORIDE 0.9 % IV SOLN
INTRAVENOUS | Status: DC
  Administered 2015-09-19 – 2015-09-21 (×5): via INTRAVENOUS
  Administered 2015-09-21: 1 mL via INTRAVENOUS

## 2015-09-19 MED ORDER — PANTOPRAZOLE SODIUM 40 MG IV SOLR
40.0000 mg | Freq: Two times a day (BID) | INTRAVENOUS | Status: DC
  Administered 2015-09-19 – 2015-09-21 (×5): 40 mg via INTRAVENOUS
  Filled 2015-09-19 (×5): qty 40

## 2015-09-19 MED ORDER — DIAZEPAM 5 MG PO TABS: 5.0000 mg | ORAL_TABLET | Freq: Four times a day (QID) | ORAL | Status: DC | PRN

## 2015-09-19 MED ORDER — OXYCODONE HCL 5 MG PO TABS
5.0000 mg | ORAL_TABLET | ORAL | Status: DC | PRN
  Administered 2015-09-19 – 2015-09-21 (×6): 5 mg via ORAL
  Filled 2015-09-19 (×7): qty 1

## 2015-09-19 MED ORDER — LORAZEPAM 2 MG/ML IJ SOLN
1.0000 mg | Freq: Once | INTRAMUSCULAR | Status: AC
  Administered 2015-09-19: 1 mg via INTRAVENOUS
  Filled 2015-09-19: qty 1

## 2015-09-19 MED ORDER — HYDROMORPHONE HCL 1 MG/ML IJ SOLN
0.5000 mg | INTRAMUSCULAR | Status: DC | PRN
  Administered 2015-09-19: 1 mg via INTRAVENOUS
  Filled 2015-09-19: qty 1

## 2015-09-19 NOTE — ED Notes (Signed)
Pt c/o "abdominal migraine" states he has had chronic problems with same, using stadol that he has at home without relief.

## 2015-09-19 NOTE — Progress Notes (Signed)
I have seen and assessed patient and agree with Dr. Arnoldo Morale assessment and plan. Patient is a 50 year old gentleman history of abdominal migraines and cyclical vomiting presented to the ED with complaints of flareup of his abdominal migraines over the past 4 days not relieved with his nasal Stadol. Patient does have a transaminitis and a such will check an acute hepatitis panel and perform a CT abdomen and pelvis. Will consult with GI for further evaluation and management. Continue pain management, IV fluids, antiemetics, supportive care.

## 2015-09-19 NOTE — ED Notes (Signed)
Called dietary for clear liquid tray for holding patient. States that they will bring up chicken broth.

## 2015-09-19 NOTE — Progress Notes (Signed)
Initial Nutrition Assessment   DOCUMENTATION CODES:  Obesity unspecified  INTERVENTION:  Pt/Wife decline Oral supplements. Monitor PO intake as diet resumes and intervene with supplements, vit/mins, education, snacks as needed.   NUTRITION DIAGNOSIS:  Inadequate oral intake related to chronic illness+Severe abdominal pain as evidenced by reported energy intake of < or equal to 50% of estimated needs for > or equal to 5 days.  GOAL:  Patient will meet greater than or equal to 90% of their needs  MONITOR:  PO intake  REASON FOR ASSESSMENT:  Malnutrition Screening Tool    ASSESSMENT:  50 y.o. male PMHx HTN, ABD Migraines and Cyclical Vomiting who presents to ED with complaints of a flare up of his pain for the past 4 days.He has had nausea and has not been able to eat/drink due to pain (10/10).   Pt very lethargic on RD arrival. Spoke with wife who seemed to be very knowledgeable. She reports pt has dealt with abdominal migraines for 10+ years and is followed at [facility]. She states that pt has had minimal intake since Sunday due to severe abdominal pain. Anything he would eat or drink would come back up. Per her report, his flares are typically  not this severe or prolonged.   She reports severe diarrhea, nausea and vomiting during these flares.   Pt typically follows the "BART" diet when he is going though flares. Notes that he tries to eat foods that "absorb liquids". She says excess liquids contribute to his abdominal pains. She says this is why CL/FL diets typically exacerbate his pain.  She states he has lost 5-10 in the last couple months which is consistent with EMR documentation. Insufficient/insignificant wt loss information to dx w/ severe mal.   Wife reports pt does not like Ensure/Boost at all. He did not take any vitamins/minerals at home.   Will follow to assess diet tolerance.   NFPE: No muscle/fat wasting, but patient has significant pallor- wife also notes he has  been much more pale recently. Oral cavity pink, WDL.   Labs reviewed: Slightly elevated liver enzymes, Leukocytosis, hyperglycemia  Diet Order:  Diet clear liquid Room service appropriate?: Yes; Fluid consistency:: Thin  Skin:  Reviewed, no issues  Last BM:  3/15  Height:  Ht Readings from Last 1 Encounters:  09/19/15 5\' 7"  (1.702 m)   Weight:  Wt Readings from Last 1 Encounters:  09/19/15 225 lb 1.4 oz (102.1 kg)   Wt Readings from Last 10 Encounters:  09/19/15 225 lb 1.4 oz (102.1 kg)  09/17/15 223 lb (101.152 kg)  06/18/15 231 lb 8 oz (105.008 kg)  03/14/15 234 lb (106.142 kg)  03/04/15 232 lb 4 oz (105.348 kg)  11/27/14 223 lb 3.2 oz (101.243 kg)  08/28/14 222 lb (100.699 kg)  05/10/14 221 lb (100.245 kg)  03/02/14 224 lb (101.606 kg)  12/18/13 221 lb 9.6 oz (100.517 kg)  Admit weight: 220 lbs   Ideal Body Weight:  67.27 kg  BMI:  Body mass index is 35.25 kg/(m^2).  Estimated Nutritional Needs:  Kcal:  1500-1700 kcals (15-17 kcal/kg bw) Protein:  60-74 (.9-1.1 g/kg ibw) Fluid:  1.5-1.7 liters fluid  EDUCATION NEEDS:  No education needs identified at this time  Burtis Junes RD, LDN Nutrition Pager: [phone] 09/19/2015 4:42 PM

## 2015-09-19 NOTE — Progress Notes (Signed)
Transferred to 326 in stable condition, reported to Mickel Fuchs, [facility].

## 2015-09-19 NOTE — ED Notes (Signed)
Dr. Grandville Silos at bedside. Pt given chicken broth per request.

## 2015-09-19 NOTE — ED Provider Notes (Signed)
CSN: [id number]     Arrival date & time 09/19/15  0021 History   First MD Initiated Contact with Patient 09/19/15 0305   Chief Complaint  Patient presents with  . Abdominal Pain     (Consider location/radiation/quality/duration/timing/severity/associated sxs/prior Treatment) HPI patient reports he has a history of abdominal migraines diagnosed at [facility] by neurologist Dr. Amadeo Garnet. He states if he gets out of control it turns into cyclic vomiting. He states on Sunday, March 12 he started getting the abdominal migraines and now he's progress the point is having cyclic vomiting, however he isn't vomiting. He states he started using nasal Stadol 3 times a day that day which is controlling the nausea and vomiting however it is not breaking the cycle. He states he has had nausea but is not vomiting. He has had minimal diarrhea and states he just passing small amount of stool. He is not having fever. He states he's having pain in his left upper quadrant and he states he feels dehydrated.  Patient saw his PCP on the 14th in the office for regular visit. No change in his therapy was done.  PCP Dr Wolfgang Phoenix  Past Medical History  Diagnosis Date  . Cyclical vomiting   . Hypertension   . Slipped intervertebral disc   . Sleep apnea     states uses O2 and CPAP at night  . Cervical neuralgia   . Elevated hemoglobin (HCC)    Past Surgical History  Procedure Laterality Date  . Esophagogastroduodenoscopy     Family History  Problem Relation Age of Onset  . Heart failure Father   . Hypertension Father    Social History  Substance Use Topics  . Smoking status: Never Smoker   . Smokeless tobacco: Never Used  . Alcohol Use: No  employed  Review of Systems  All other systems reviewed and are negative.     Allergies  Cefzil and Lipitor  Home Medications   Prior to Admission medications   Medication Sig Start Date End Date Taking? Authorizing Provider  aspirin EC 81 MG tablet Take 81 mg by  mouth at bedtime. Reported on 09/17/2015    Historical Provider, MD  butorphanol (STADOL) 10 MG/ML nasal spray Place 1 spray into the nose every 4 (four) hours as needed for headache. Reported on 09/17/2015    Historical Provider, MD  chlorzoxazone (PARAFON FORTE DSC) 500 MG tablet Take 1 tablet (500 mg total) by mouth 3 (three) times daily as needed for muscle spasms. 03/14/15   Kathyrn Drown, MD  diazepam (VALIUM) 5 MG tablet Take 1 tablet (5 mg total) by mouth every 6 (six) hours as needed for anxiety. 03/14/15   Kathyrn Drown, MD  fluticasone Asencion Islam) 50 MCG/ACT nasal spray Reported on 09/17/2015 07/11/14   Historical Provider, MD  HYDROcodone-acetaminophen (NORCO) 10-325 MG tablet Take one every 4 hours as needed for pain. One up to 6 times daily. 09/17/15   Kathyrn Drown, MD  lisinopril-hydrochlorothiazide (PRINZIDE,ZESTORETIC) 20-12.5 MG tablet TAKE 1 TABLET EVERY DAY 04/22/15   Kathyrn Drown, MD  meloxicam (MOBIC) 15 MG tablet 1 qd prn Patient not taking: Reported on 09/17/2015 06/18/15   Kathyrn Drown, MD  ondansetron (ZOFRAN) 8 MG tablet Take 1 tablet (8 mg total) by mouth every 8 (eight) hours as needed for nausea. Patient not taking: Reported on 09/17/2015 10/03/12   Kathyrn Drown, MD  pantoprazole (PROTONIX) 40 MG tablet TAKE 1 TABLET (40 MG TOTAL) BY MOUTH 2 (TWO) TIMES  DAILY. Patient not taking: Reported on 09/17/2015 03/02/14   Kathyrn Drown, MD  Probiotic Product ([place] PO) Take 1 capsule by mouth at bedtime. Reported on 09/17/2015    Historical Provider, MD  SALINE NASAL SPRAY NA Place 1 spray into the nose daily as needed (dryness). Reported on 09/17/2015    Historical Provider, MD  sertraline (ZOLOFT) 100 MG tablet 2 qd 06/18/15   Kathyrn Drown, MD  zolpidem (AMBIEN) 10 MG tablet TAKE 1 TABLET BY MOUTH AT BEDTIME 03/27/15   Kathyrn Drown, MD   BP 127/80 mmHg  Pulse 81  Temp(Src) 98.5 F (36.9 C) (Oral)  Resp 18  Ht 5\' 7"  (1.702 m)  Wt 220 lb (99.791 kg)  BMI 34.45  kg/m2  SpO2 99%  Vital signs normal   Physical Exam  Constitutional: He is oriented to person, place, and time. He appears well-developed and well-nourished.  Non-toxic appearance. He does not appear ill. No distress.  HENT:  Head: Normocephalic and atraumatic.  Right Ear: External ear normal.  Left Ear: External ear normal.  Nose: Nose normal. No mucosal edema or rhinorrhea.  Mouth/Throat: Oropharynx is clear and moist and mucous membranes are normal. No dental abscesses or uvula swelling.  Mucous membranes dry.  Eyes: Conjunctivae and EOM are normal. Pupils are equal, round, and reactive to light.  Neck: Normal range of motion and full passive range of motion without pain. Neck supple.  Cardiovascular: Normal rate, regular rhythm and normal heart sounds.  Exam reveals no gallop and no friction rub.   No murmur heard. Pulmonary/Chest: Effort normal and breath sounds normal. No respiratory distress. He has no wheezes. He has no rhonchi. He has no rales. He exhibits no tenderness and no crepitus.  Abdominal: Soft. Normal appearance and bowel sounds are normal. He exhibits no distension. There is tenderness. There is no rebound and no guarding.    Patient wants to keep his knees flexed up to his abdomen.  Musculoskeletal: Normal range of motion. He exhibits no edema or tenderness.  Moves all extremities well.   Neurological: He is alert and oriented to person, place, and time. He has normal strength. No cranial nerve deficit.  Skin: Skin is warm, dry and intact. No rash noted. No erythema. No pallor.  Psychiatric: He has a normal mood and affect. His speech is normal and behavior is normal. His mood appears not anxious.  Nursing note and vitals reviewed.   ED Course  Procedures (including critical care time)  Medications  sodium chloride 0.9 % bolus 1,000 mL (0 mLs Intravenous Stopped 09/19/15 0358)  sodium chloride 0.9 % bolus 1,000 mL (0 mLs Intravenous Stopped 09/19/15 0437)    LORazepam (ATIVAN) injection 1 mg (1 mg Intravenous Given 09/19/15 0320)  HYDROmorphone (DILAUDID) injection 2 mg (2 mg Intravenous Given 09/19/15 0321)  metoCLOPramide (REGLAN) injection 10 mg (10 mg Intravenous Given 09/19/15 0320)  diphenhydrAMINE (BENADRYL) injection 25 mg (25 mg Intravenous Given 09/19/15 0320)  sodium chloride 0.9 % bolus 1,000 mL (1,000 mLs Intravenous New Bag/Given 09/19/15 0437)  haloperidol lactate (HALDOL) injection 2.5 mg (2.5 mg Intravenous Given 09/19/15 0508)  ketorolac (TORADOL) 30 MG/ML injection 30 mg (30 mg Intravenous Given 09/19/15 0509)  sodium chloride 0.9 % bolus 1,000 mL (1,000 mLs Intravenous New Bag/Given 09/19/15 0517)  LORazepam (ATIVAN) injection 1 mg (1 mg Intravenous Given 09/19/15 0515)    Patient states he wants dilaudid 2 mg IV and then in 30 minutes he wants repeat dose. I  told him I would give him 1 dose of Dilaudid. He was given IV fluids for his dehydration. He was given Ativan which is standard treatment for cyclic vomiting syndrome. Given Reglan and Benadryl for his nausea.  Patient was rechecked at 4 AM. He has a bottled water sitting on the stretcher in front of him. He states he's only been using it to wet his mouth. However it's half empty. We discussed his laboratory results. Patient states he feels like he should be admitted. His concern is he has been taking the Stadol every 8 hours and it is controlling the vomiting but in 8 hours it wears off and he feels worse. I told him I would discuss it with the hospitalist. I encouraged the patient to try to drink some fluids and see how he did.  5:15 AM nurse came to me and states patient wants to try to eat saltine crackers. She was advised he could.  5:25 AM Dr. Gasper Lloyd, hospitalist. We discussed the patient. She is going to see him and decide if he does need admission.  05:45 Dr Gasper Lloyd, states to admit to med-surg, observation   Review of the [place] shows patient gets  #180 hydrocodone 10/325 monthly from his PCP, they were last filled on February 18. In the past 6 months he has also gotten tramadol twice, 30 tablets each time by his PCP. He also gets #30 Ambien 10 mg tablets monthly and in November got #35 Valium 5 mg tablets. These are all prescribed by his PCP. He did receive Stadol 10 mg per mL spray on March 14 by his neurologist at [facility].  Labs Review Results for orders placed or performed during the hospital encounter of 09/19/15  Comprehensive metabolic panel  Result Value Ref Range   Sodium 140 135 - 145 mmol/L   Potassium 3.9 3.5 - 5.1 mmol/L   Chloride 103 101 - 111 mmol/L   CO2 27 22 - 32 mmol/L   Glucose, Bld 123 (H) 65 - 99 mg/dL   BUN 19 6 - 20 mg/dL   Creatinine, Ser 1.13 0.61 - 1.24 mg/dL   Calcium 9.6 8.9 - 10.3 mg/dL   Total Protein 8.8 (H) 6.5 - 8.1 g/dL   Albumin 4.8 3.5 - 5.0 g/dL   AST 47 (H) 15 - 41 U/L   ALT 97 (H) 17 - 63 U/L   Alkaline Phosphatase 98 38 - 126 U/L   Total Bilirubin 0.9 0.3 - 1.2 mg/dL   GFR calc non Af Amer >60 >60 mL/min   GFR calc Af Amer >60 >60 mL/min   Anion gap 10 5 - 15  CBC with Differential  Result Value Ref Range   WBC 11.5 (H) 4.0 - 10.5 K/uL   RBC 5.70 4.22 - 5.81 MIL/uL   Hemoglobin 17.1 (H) 13.0 - 17.0 g/dL   HCT 49.6 39.0 - 52.0 %   MCV 87.0 78.0 - 100.0 fL   MCH 30.0 26.0 - 34.0 pg   MCHC 34.5 30.0 - 36.0 g/dL   RDW 12.0 11.5 - 15.5 %   Platelets 397 150 - 400 K/uL   Neutrophils Relative % 58 %   Neutro Abs 6.6 1.7 - 7.7 K/uL   Lymphocytes Relative 33 %   Lymphs Abs 3.8 0.7 - 4.0 K/uL   Monocytes Relative 9 %   Monocytes Absolute 1.0 0.1 - 1.0 K/uL   Eosinophils Relative 0 %   Eosinophils Absolute 0.1 0.0 - 0.7 K/uL  Basophils Relative 0 %   Basophils Absolute 0.0 0.0 - 0.1 K/uL  Lipase, blood  Result Value Ref Range   Lipase 22 11 - 51 U/L   Laboratory interpretation all normal except minor concentration of hemoglobin, leukocytosis, minor elevation of LFTs   MDM   Final  diagnoses:  Left upper quadrant pain    Plan admission   Rolland Porter, MD, Barbette Or, MD 09/19/15 [phone]

## 2015-09-19 NOTE — ED Notes (Signed)
Second CT scan ordered. Pt refused to go to CT scan at this time. Pt states he may want to do it later. Pt refused CT scan last night according to previous RN.

## 2015-09-19 NOTE — H&P (Addendum)
[facility] Admission History and Physical       Kroix Goethals Sgro [id number] DOB: [birth year] DOA: 09/19/2015  Referring physician:  EDP PCP: Sallee Lange, MD  Specialists:   Chief Complaint: LUQ ABD Pain  HPI: Bryan Gilbert is a 50 y.o. male with a history of ABD Migraines and Cyclical Vomiting who preents to the ED with complaints of a flare up of his pain for the past  4 days.   The pain is sharp and 10/10 in the LUQ.    He reports having some nausea, but has not been able to eat or drink because of the pain.   He was seen by his PCP and prescribed Stadol for his pain to take TID PRN, he reports that the pain has been controlled with the Stadol TID but returns when it wears off, and the flare has lasted longer than usual which has him concerned.  He denies any fevers or chills.    He reports that he see a Garment/textile technologist and a GI doctor at [facility], and he has had ABD Migraines for the past 15 years.        Review of Systems: Unable to Obtain from the Patient Constitutional: No Weight Loss, No Weight Gain, Night Sweats, Fevers, Chills, Dizziness, Light Headedness, Fatigue, or Generalized Weakness HEENT: No Headaches, Difficulty Swallowing,Tooth/Dental Problems,Sore Throat,  No Sneezing, Rhinitis, Ear Ache, Nasal Congestion, or Post Nasal Drip,  Cardio-vascular:  No Chest pain, Orthopnea, PND, Edema in Lower Extremities, Anasarca, Dizziness, Palpitations  Resp: No Dyspnea, No DOE, No Productive Cough, No Non-Productive Cough, No Hemoptysis, No Wheezing.    GI: No Heartburn, Indigestion, +Abdominal Pain, Nausea, Vomiting, Diarrhea, Constipation, Hematemesis, Hematochezia, Melena, Change in Bowel Habits,  Loss of Appetite  GU: No Dysuria, No Change in Color of Urine, No Urgency or Urinary Frequency, No Flank pain.  Musculoskeletal: No Joint Pain or Swelling, No Decreased Range of Motion, No Back Pain.  Neurologic: No Syncope, No Seizures, Muscle Weakness, Paresthesia, Vision Disturbance or  Loss, No Diplopia, No Vertigo, No Difficulty Walking,  Skin: No Rash or Lesions. Psych: No Change in Mood or Affect, No Depression or Anxiety, No Memory loss, No Confusion, or Hallucinations   Past Medical History  Diagnosis Date  . Cyclical vomiting   . Hypertension   . Slipped intervertebral disc   . Sleep apnea     states uses O2 and CPAP at night  . Cervical neuralgia   . Elevated hemoglobin (HCC)      Past Surgical History  Procedure Laterality Date  . Esophagogastroduodenoscopy        Prior to Admission medications   Medication Sig Start Date End Date Taking? Authorizing Provider  aspirin EC 81 MG tablet Take 81 mg by mouth at bedtime. Reported on 09/17/2015    Historical Provider, MD  butorphanol (STADOL) 10 MG/ML nasal spray Place 1 spray into the nose every 4 (four) hours as needed for headache. Reported on 09/17/2015    Historical Provider, MD  chlorzoxazone (PARAFON FORTE DSC) 500 MG tablet Take 1 tablet (500 mg total) by mouth 3 (three) times daily as needed for muscle spasms. 03/14/15   Kathyrn Drown, MD  diazepam (VALIUM) 5 MG tablet Take 1 tablet (5 mg total) by mouth every 6 (six) hours as needed for anxiety. 03/14/15   Kathyrn Drown, MD  fluticasone Asencion Islam) 50 MCG/ACT nasal spray Reported on 09/17/2015 07/11/14   Historical Provider, MD  HYDROcodone-acetaminophen (NORCO) 10-325 MG tablet Take one every  4 hours as needed for pain. One up to 6 times daily. 09/17/15   Kathyrn Drown, MD  lisinopril-hydrochlorothiazide (PRINZIDE,ZESTORETIC) 20-12.5 MG tablet TAKE 1 TABLET EVERY DAY 04/22/15   Kathyrn Drown, MD  meloxicam (MOBIC) 15 MG tablet 1 qd prn Patient not taking: Reported on 09/17/2015 06/18/15   Kathyrn Drown, MD  ondansetron (ZOFRAN) 8 MG tablet Take 1 tablet (8 mg total) by mouth every 8 (eight) hours as needed for nausea. Patient not taking: Reported on 09/17/2015 10/03/12   Kathyrn Drown, MD  pantoprazole (PROTONIX) 40 MG tablet TAKE 1 TABLET (40 MG TOTAL) BY  MOUTH 2 (TWO) TIMES DAILY. Patient not taking: Reported on 09/17/2015 03/02/14   Kathyrn Drown, MD  Probiotic Product ([place] PO) Take 1 capsule by mouth at bedtime. Reported on 09/17/2015    Historical Provider, MD  SALINE NASAL SPRAY NA Place 1 spray into the nose daily as needed (dryness). Reported on 09/17/2015    Historical Provider, MD  sertraline (ZOLOFT) 100 MG tablet 2 qd 06/18/15   Kathyrn Drown, MD  zolpidem (AMBIEN) 10 MG tablet TAKE 1 TABLET BY MOUTH AT BEDTIME 03/27/15   Kathyrn Drown, MD     Allergies  Allergen Reactions  . Cefzil [Cefprozil]     Intestinal upset and diarrhea  . Lipitor [Atorvastatin] Other (See Comments)    Myalgia    Social History:  reports that he has never smoked. He has never used smokeless tobacco. He reports that he does not drink alcohol or use illicit drugs.    Family History  Problem Relation Age of Onset  . Heart failure Father   . Hypertension Father        Physical Exam:  GEN:  Pleasant Obese  50 y.o. Caucasian male examined and in no acute distress; cooperative with exam Filed Vitals:   09/19/15 0039 09/19/15 0247 09/19/15 0509  BP: 133/105 149/108 127/80  Pulse: 106 98 81  Temp: 98.6 F (37 C) 98.5 F (36.9 C)   TempSrc: Oral Oral   Resp: 18 18 18   Height: 5\' 7"  (1.702 m)    Weight: 99.791 kg (220 lb)    SpO2: 99% 99% 99%   Blood pressure 127/80, pulse 81, temperature 98.5 F (36.9 C), temperature source Oral, resp. rate 18, height 5\' 7"  (1.702 m), weight 99.791 kg (220 lb), SpO2 99 %. PSYCH: He is alert and oriented x4; does not appear anxious does not appear depressed; affect is normal HEENT: Normocephalic and Atraumatic, Mucous membranes pink; PERRLA; EOM intact; Fundi:  Benign;  No scleral icterus, Nares: Patent, Oropharynx: Clear, Edentulous or Fair Dentition,    Neck:  FROM, No Cervical Lymphadenopathy nor Thyromegaly or Carotid Bruit; No JVD; Breasts:: Not examined CHEST WALL: No tenderness CHEST:  Normal respiration, clear to auscultation bilaterally HEART: Regular rate and rhythm; no murmurs rubs or gallops BACK: No kyphosis or scoliosis; No CVA tenderness ABDOMEN: Positive Bowel Sounds, Obese, Soft Non-Tender, No Rebound or Guarding; No Masses, No Organomegaly. Rectal Exam: Not done EXTREMITIES: No Cyanosis, Clubbing, or Edema; No Ulcerations. Genitalia: not examined PULSES: 2+ and symmetric SKIN: Normal hydration no rash or ulceration  CNS:  Alert and Oriented x 4, No Focal Deficits Vascular: pulses palpable throughout    Labs on Admission:  Basic Metabolic Panel:  Recent Labs Lab 09/19/15 0218  NA 140  K 3.9  CL 103  CO2 27  GLUCOSE 123*  BUN 19  CREATININE 1.13  CALCIUM 9.6  Liver Function Tests:  Recent Labs Lab 09/19/15 0218  AST 47*  ALT 97*  ALKPHOS 98  BILITOT 0.9  PROT 8.8*  ALBUMIN 4.8    Recent Labs Lab 09/19/15 0218  LIPASE 22   No results for input(s): AMMONIA in the last 168 hours. CBC:  Recent Labs Lab 09/19/15 0218  WBC 11.5*  NEUTROABS 6.6  HGB 17.1*  HCT 49.6  MCV 87.0  PLT 397   Cardiac Enzymes: No results for input(s): CKTOTAL, CKMB, CKMBINDEX, TROPONINI in the last 168 hours.  BNP (last 3 results) No results for input(s): BNP in the last 8760 hours.  ProBNP (last 3 results) No results for input(s): PROBNP in the last 8760 hours.  CBG: No results for input(s): GLUCAP in the last 168 hours.  Radiological Exams on Admission: No results found.    Assessment/Plan:      50 y.o. male with  Principal Problem:    Abdominal pain, left upper quadrant    PRN IV Dilaudid for Pain    IVFs    IV Protonix    Consider Consultation to GI, or Follow Up with his GI as Outpatient  Active Problems:    Cyclic vomiting syndrome    PRN IV Zofran    Clear Liquids Diet Advance as tolerated      HTN (hypertension)    PRN IV Hydralazine       Polycythemia, secondary    Chronic       Cervical neuralgia    Pain  Control with IV Dilaudid while not tolerating PO     DVT Prophylaxis    Lovenox     Code Status:     FULL CODE       Family Communication:   Wife at Bedside       Disposition Plan:    Observation Status        Time spent: 69 Minutes      Theressa Millard [facility] Pager [phone]   If [place] Please Contact the Day Rounding Team MD for [facility]  If 7PM-7AM, Please Contact Night-Floor Coverage  [email or web address] Password [email or web address] 09/19/2015, 5:49 AM     ADDENDUM:   Patient was seen and examined on 09/19/2015

## 2015-09-20 DIAGNOSIS — K760 Fatty (change of) liver, not elsewhere classified: Secondary | ICD-10-CM | POA: Diagnosis present

## 2015-09-20 DIAGNOSIS — M792 Neuralgia and neuritis, unspecified: Secondary | ICD-10-CM | POA: Diagnosis present

## 2015-09-20 DIAGNOSIS — M5412 Radiculopathy, cervical region: Secondary | ICD-10-CM | POA: Diagnosis not present

## 2015-09-20 DIAGNOSIS — G43D0 Abdominal migraine, not intractable: Secondary | ICD-10-CM | POA: Diagnosis present

## 2015-09-20 DIAGNOSIS — I10 Essential (primary) hypertension: Secondary | ICD-10-CM

## 2015-09-20 DIAGNOSIS — D751 Secondary polycythemia: Secondary | ICD-10-CM | POA: Diagnosis present

## 2015-09-20 DIAGNOSIS — G43A0 Cyclical vomiting, not intractable: Secondary | ICD-10-CM | POA: Diagnosis present

## 2015-09-20 DIAGNOSIS — R1012 Left upper quadrant pain: Secondary | ICD-10-CM

## 2015-09-20 DIAGNOSIS — Z8249 Family history of ischemic heart disease and other diseases of the circulatory system: Secondary | ICD-10-CM | POA: Diagnosis not present

## 2015-09-20 DIAGNOSIS — Z7982 Long term (current) use of aspirin: Secondary | ICD-10-CM | POA: Diagnosis not present

## 2015-09-20 DIAGNOSIS — Z7951 Long term (current) use of inhaled steroids: Secondary | ICD-10-CM | POA: Diagnosis not present

## 2015-09-20 DIAGNOSIS — F1120 Opioid dependence, uncomplicated: Secondary | ICD-10-CM | POA: Diagnosis present

## 2015-09-20 DIAGNOSIS — E669 Obesity, unspecified: Secondary | ICD-10-CM | POA: Diagnosis present

## 2015-09-20 DIAGNOSIS — Z6834 Body mass index (BMI) 34.0-34.9, adult: Secondary | ICD-10-CM | POA: Diagnosis not present

## 2015-09-20 LAB — COMPREHENSIVE METABOLIC PANEL
ALT: 75 U/L — ABNORMAL HIGH (ref 17–63)
AST: 39 U/L (ref 15–41)
Albumin: 3.6 g/dL (ref 3.5–5.0)
Alkaline Phosphatase: 71 U/L (ref 38–126)
Anion gap: 8 (ref 5–15)
BUN: 10 mg/dL (ref 6–20)
CO2: 25 mmol/L (ref 22–32)
Calcium: 8.5 mg/dL — ABNORMAL LOW (ref 8.9–10.3)
Chloride: 107 mmol/L (ref 101–111)
Creatinine, Ser: 0.91 mg/dL (ref 0.61–1.24)
GFR calc Af Amer: >60 mL/min (ref >60)
GFR calc non Af Amer: >60 mL/min (ref >60)
Glucose, Bld: 102 mg/dL — ABNORMAL HIGH (ref 65–99)
Potassium: 3.6 mmol/L (ref 3.5–5.1)
Sodium: 140 mmol/L (ref 135–145)
Total Bilirubin: 0.6 mg/dL (ref 0.3–1.2)
Total Protein: 6.5 g/dL (ref 6.5–8.1)

## 2015-09-20 LAB — CBC
HCT: 41.1 % (ref 39.0–52.0)
Hemoglobin: 13.9 g/dL (ref 13.0–17.0)
MCH: 29.5 pg (ref 26.0–34.0)
MCHC: 33.8 g/dL (ref 30.0–36.0)
MCV: 87.3 fL (ref 78.0–100.0)
Platelets: 296 10*3/uL (ref 150–400)
RBC: 4.71 MIL/uL (ref 4.22–5.81)
RDW: 11.8 % (ref 11.5–15.5)
WBC: 8.3 10*3/uL (ref 4.0–10.5)

## 2015-09-20 LAB — HEPATITIS PANEL, ACUTE
HCV Ab: <0.1 s/co ratio (ref 0.0–0.9)
Hep A IgM: NEGATIVE
Hep B C IgM: NEGATIVE
Hepatitis B Surface Ag: NEGATIVE

## 2015-09-20 MED ORDER — ZOLPIDEM TARTRATE 5 MG PO TABS: 10.0000 mg | ORAL_TABLET | Freq: Every day | ORAL | Status: DC

## 2015-09-20 MED ORDER — PANTOPRAZOLE SODIUM 40 MG PO TBEC
40.0000 mg | DELAYED_RELEASE_TABLET | Freq: Every day | ORAL | Status: DC
  Administered 2015-09-20 – 2015-09-21 (×2): 40 mg via ORAL
  Filled 2015-09-20: qty 1

## 2015-09-20 MED ORDER — BUTORPHANOL TARTRATE 10 MG/ML NA SOLN
1.0000 | NASAL | Status: DC | PRN
  Filled 2015-09-20: qty 2.5

## 2015-09-20 MED ORDER — SERTRALINE HCL 50 MG PO TABS
200.0000 mg | ORAL_TABLET | Freq: Every day | ORAL | Status: DC
  Filled 2015-09-20: qty 4

## 2015-09-20 MED ORDER — POLYETHYLENE GLYCOL 3350 17 G PO PACK: 17.0000 g | PACK | Freq: Every day | ORAL | Status: DC | PRN

## 2015-09-20 NOTE — Care Management Note (Signed)
Case Management Note  Patient Details  Name: Bryan Gilbert MRN: [id number] Date of Birth: 04-Nov-1965  Subjective/Objective:                    Action/Plan: Home with self care.   Expected Discharge Date:                  Expected Discharge Plan:  Home/Self Care  In-House Referral:     Discharge planning Services  CM Consult  Post Acute Care Choice:    Choice offered to:     DME Arranged:    DME Agency:     HH Arranged:    [place] Agency:     Status of Service:  Completed, signed off  Medicare Important Message Given:    Date Medicare IM Given:    Medicare IM give by:    Date Additional Medicare IM Given:    Additional Medicare Important Message give by:     If discussed at [place] of Stay Meetings, dates discussed:    Additional Comments:  Alvie Heidelberg, RN 09/20/2015, 3:10 PM

## 2015-09-20 NOTE — Progress Notes (Signed)
[facility] PROGRESS NOTE    Progress Note   Bryan Gilbert [id number] DOB: [birth year] DOA: 09/19/2015 PCP: Sallee Lange, MD   Brief Narrative:   Bryan Gilbert is an 50 y.o. male comes in for abdominal pain.  Assessment/Plan:   Abdominal pain, left upper quadrant/  Cyclic vomiting syndrome/mild elevation in LTF's: Continue pain medications, will cont IV fluids and antiemetics. Her UDS Was positive for benzodiazepine and opiates, his urine specific gravity was greater then 1030, her hep acute hepatitis panel negative. Season started to improve. CT scan of the abdomen pelvis shows no acute finding, only fatty liver. Continue IV Protonix. Resume home meds.  Essential  HTN (hypertension): Continue IV hydralazine.  Polycythemia, secondary: His hemoglobin was 17 [place] 13 she was likely hemoconcentrated on admission.  Cervical neuralgia: Continue IV pain medication.    DVT Prophylaxis - Lovenox ordered.  Family Communication: Wife Disposition Plan: Home in 2-3 days Code Status:     Code Status Orders        Start     Ordered   09/19/15 1132  Full code   Continuous     09/19/15 1131    Code Status History    Date Active Date Inactive Code Status Order ID Comments User Context   06/09/2013 10:54 AM 06/09/2013  7:22 PM Full Code [id number]  Kathie Dike, MD Inpatient   02/11/2012  4:39 PM 02/12/2012  2:36 PM Full Code [id number]  Rexene Alberts, MD Inpatient        IV Access:    Peripheral IV   Procedures and diagnostic studies:   Ct Abdomen Pelvis W Contrast  09/19/2015  CLINICAL DATA:  Nausea, vomiting and left upper quadrant pain. EXAM: CT ABDOMEN AND PELVIS WITH CONTRAST TECHNIQUE: Multidetector CT imaging of the abdomen and pelvis was performed using the standard protocol following bolus administration of intravenous contrast. CONTRAST:  120mL OMNIPAQUE IOHEXOL 300 MG/ML  SOLN COMPARISON:  April 17, 2009 FINDINGS: Lower chest: There minimal atelectasis of  posterior lung bases. There is no focal pneumonia or pleural effusion. The heart size is normal. Hepatobiliary: There is diffuse low density of liver without vessel displacement. No focal liver lesion is identified. The gallbladder is normal. The biliary tree is normal. Pancreas: No mass, inflammatory changes, or other significant abnormality. Spleen: Within normal limits in size and appearance. Adrenals/Urinary Tract: No masses identified. No evidence of hydronephrosis. Stomach/Bowel: No evidence of obstruction, inflammatory process, or abnormal fluid collections. The appendix is normal. Vascular/Lymphatic: No pathologically enlarged lymph nodes. No evidence of abdominal aortic aneurysm. Reproductive: No mass or other significant abnormality. Other: None. Musculoskeletal: No acute abnormalities identified within the bones. IMPRESSION: No acute abnormality identified in the abdomen pelvis. Fatty infiltration of liver. Electronically Signed   By: Abelardo Diesel M.D.   On: 09/19/2015 15:11     Medical Consultants:    None.  Anti-Infectives:   Anti-infectives    None      Subjective:    Bryan Gilbert he relates he's able to tolerate orals, his abdominal pain is improving. Has not had a bowel movement.  Objective:    Filed Vitals:   09/19/15 1511 09/19/15 1957 09/20/15 0200 09/20/15 0619  BP: 122/78 126/81 128/80 111/70  Pulse: 76 71 70 70  Temp: 98.2 F (36.8 C) 97.6 F (36.4 C) 97.1 F (36.2 C) 97.5 F (36.4 C)  TempSrc: Oral Oral Oral Oral  Resp: 18 20 20 20   Height:      Weight:  SpO2: 97% 97% 98% 98%    Intake/Output Summary (Last 24 hours) at 09/20/15 1016 Last data filed at 09/20/15 0620  Gross per 24 hour  Intake   2305 ml  Output   1500 ml  Net    805 ml   Filed Weights   09/19/15 0039 09/19/15 1154  Weight: 99.791 kg (220 lb) 102.1 kg (225 lb 1.4 oz)    Exam: Gen:  NAD Cardiovascular:  RRR. Chest and lungs:   CTAB Abdomen:  Abdomen soft, NT/ND, +  BS Extremities:  No edema   Data Reviewed:    Labs: Basic Metabolic Panel:  Recent Labs Lab 09/19/15 0218 09/20/15 0535  NA 140 140  K 3.9 3.6  CL 103 107  CO2 27 25  GLUCOSE 123* 102*  BUN 19 10  CREATININE 1.13 0.91  CALCIUM 9.6 8.5*   GFR Estimated Creatinine Clearance: 111.8 mL/min (by C-G formula based on Cr of 0.91). Liver Function Tests:  Recent Labs Lab 09/19/15 0218 09/20/15 0535  AST 47* 39  ALT 97* 75*  ALKPHOS 98 71  BILITOT 0.9 0.6  PROT 8.8* 6.5  ALBUMIN 4.8 3.6    Recent Labs Lab 09/19/15 0218  LIPASE 22   No results for input(s): AMMONIA in the last 168 hours. Coagulation profile No results for input(s): INR, PROTIME in the last 168 hours.  CBC:  Recent Labs Lab 09/19/15 0218 09/20/15 0535  WBC 11.5* 8.3  NEUTROABS 6.6  --   HGB 17.1* 13.9  HCT 49.6 41.1  MCV 87.0 87.3  PLT 397 296   Cardiac Enzymes: No results for input(s): CKTOTAL, CKMB, CKMBINDEX, TROPONINI in the last 168 hours. BNP (last 3 results) No results for input(s): PROBNP in the last 8760 hours. CBG: No results for input(s): GLUCAP in the last 168 hours. D-Dimer: No results for input(s): DDIMER in the last 72 hours. Hgb A1c: No results for input(s): HGBA1C in the last 72 hours. Lipid Profile: No results for input(s): CHOL, HDL, LDLCALC, TRIG, CHOLHDL, LDLDIRECT in the last 72 hours. Thyroid function studies: No results for input(s): TSH, T4TOTAL, T3FREE, THYROIDAB in the last 72 hours.  Invalid input(s): FREET3 Anemia work up: No results for input(s): VITAMINB12, FOLATE, FERRITIN, TIBC, IRON, RETICCTPCT in the last 72 hours. Sepsis Labs:  Recent Labs Lab 09/19/15 0218 09/20/15 0535  WBC 11.5* 8.3   Microbiology No results found for this or any previous visit (from the past 240 hour(s)).   Medications:   . enoxaparin (LOVENOX) injection  50 mg Subcutaneous Q24H  . feeding supplement  1 Container Oral TID BM  . pantoprazole (PROTONIX) IV  40 mg  Intravenous Q12H   Continuous Infusions: . sodium chloride 100 mL/hr at 09/20/15 0403    Time spent: 25 min     [name]  [facility] Pager [phone]  *Please refer to [email or web address], password [email or web address] to get updated schedule on who will round on this patient, as hospitalists switch teams weekly. If 7PM-7AM, please contact night-coverage at [email or web address], password [email or web address] for any overnight needs.  09/20/2015, 10:16 AM

## 2015-09-21 DIAGNOSIS — F1120 Opioid dependence, uncomplicated: Secondary | ICD-10-CM

## 2015-09-21 DIAGNOSIS — D751 Secondary polycythemia: Secondary | ICD-10-CM

## 2015-09-21 MED ORDER — PANTOPRAZOLE SODIUM 40 MG PO TBEC: 40.0000 mg | DELAYED_RELEASE_TABLET | Freq: Every day | ORAL | Status: DC

## 2015-09-21 MED ORDER — CHLORZOXAZONE 500 MG PO TABS
500.0000 mg | ORAL_TABLET | Freq: Three times a day (TID) | ORAL | Status: DC | PRN
  Filled 2015-09-21: qty 1

## 2015-09-21 MED ORDER — ONDANSETRON HCL 4 MG PO TABS: 8.0000 mg | ORAL_TABLET | Freq: Three times a day (TID) | ORAL | Status: DC | PRN

## 2015-09-21 MED ORDER — HYDROCODONE-ACETAMINOPHEN 10-325 MG PO TABS
1.0000 | ORAL_TABLET | Freq: Four times a day (QID) | ORAL | Status: DC | PRN
  Administered 2015-09-21: 2 via ORAL
  Filled 2015-09-21: qty 2

## 2015-09-21 NOTE — Progress Notes (Signed)
Patient alert and oriented, independent, VSS, pt. Tolerating diet well. No complaints of pain or nausea. Pt. Had IV removed tip intact. Pt. Had prescriptions given. Pt. Voiced understanding of discharge instructions with no further questions. Pt. Discharged via wheelchair with auxilliary.  

## 2015-09-21 NOTE — Discharge Summary (Signed)
Physician Discharge Summary  Bryan Gilbert [id number] DOB: [birth year] DOA: 09/19/2015  PCP: Sallee Lange, MD  Admit date: 09/19/2015 Discharge date: 09/21/2015  Time spent: 35 minutes  Recommendations for Outpatient Follow-up:  1. Follow up with PCP as an outpatient as needed. 2. No additional narcotics were given during this admission.  Discharge Diagnoses:  Principal Problem:   Abdominal pain, left upper quadrant Active Problems:   Cyclic vomiting syndrome   HTN (hypertension)   Polycythemia, secondary   Abdominal pain   Cervical neuralgia   Narcotic dependency, continuous ([place])   Discharge Condition: Stable  Diet recommendation: Regular  Filed Weights   09/19/15 0039 09/19/15 1154  Weight: 99.791 kg (220 lb) 102.1 kg (225 lb 1.4 oz)    History of present illness:  50 y.o. male with a history of ABD Migraines and Cyclical Vomiting who preents to the ED with complaints of a flare up of his pain for the past 4 days. The pain is sharp and 10/10 in the LUQ. He reports having some nausea, but has not been able to eat or drink because of the pain. He was seen by his PCP and prescribed Stadol for his pain to take TID PRN, he reports that the pain has been controlled with the Stadol TID but returns when it wears off, and the flare has lasted longer than usual which has him concerned  Hospital Course:  Abdominal pain, left upper quadrant/ Cyclic vomiting syndrome/mild elevation in LTF's: Acute hepatitis panel negative. Placed NPO and started on IV fluids and IV narcotics. CT scan of the abdomen pelvis shows no acute finding, only fatty liver. Once he was able to tolerate oral his home medications were started. His narcotics switch to orals. He was angry at the physcian for changing his narcotics to orals and demanded to be discharge. He should follow up with a pain clinic as an outpatient.  Essential HTN (hypertension): Blood pressure has been stable continue to  monitor. No changes made to his medications.  Polycythemia, secondary: His hemoglobin was 17 [place] 13 she was likely hemoconcentrated on admission.  Cervical neuralgia: No change  Narcotic seeking behavior: He keeps insisting on being on IV Dilaudid. I have explained to him that if he is to go home we have to change his regimen to something he can take at home. He kept instigating the nurses on having IV narcotics.    Procedures:  CT abd and pelvis no acute findings.   Consultations:  None  Discharge Exam: Filed Vitals:   09/21/15 0539 09/21/15 1529  BP: 126/82 138/91  Pulse: 67 65  Temp: 98.4 F (36.9 C) 98.2 F (36.8 C)  Resp: 20 20    General: A&O x3 Cardiovascular: RRR Respiratory: Good air movement CTA B/L  Discharge Instructions   Discharge Instructions    Diet - low sodium heart healthy    Complete by:  As directed      Increase activity slowly    Complete by:  As directed           Current Discharge Medication List    CONTINUE these medications which have NOT CHANGED   Details  butorphanol (STADOL) 10 MG/ML nasal spray Place 1 spray into the nose every 4 (four) hours as needed for headache. Reported on 09/17/2015    chlorzoxazone (PARAFON FORTE DSC) 500 MG tablet Take 1 tablet (500 mg total) by mouth 3 (three) times daily as needed for muscle spasms. Qty: 35 tablet, Refills: 3  HYDROcodone-acetaminophen (NORCO) 10-325 MG tablet Take one every 4 hours as needed for pain. One up to 6 times daily. Qty: 180 tablet, Refills: 0    lisinopril-hydrochlorothiazide (PRINZIDE,ZESTORETIC) 20-12.5 MG tablet TAKE 1 TABLET EVERY DAY Qty: 90 tablet, Refills: 1    Probiotic Product (PHILLIPS [employer]ON HEALTH PO) Take 1 capsule by mouth at bedtime. Reported on 09/17/2015    SALINE NASAL SPRAY NA Place 1 spray into the nose daily as needed (dryness). Reported on 09/17/2015    sertraline (ZOLOFT) 100 MG tablet 2 qd Qty: 180 tablet, Refills: 2    zolpidem (AMBIEN)  10 MG tablet TAKE 1 TABLET BY MOUTH AT BEDTIME Qty: 30 tablet, Refills: 5    diazepam (VALIUM) 5 MG tablet Take 1 tablet (5 mg total) by mouth every 6 (six) hours as needed for anxiety. Qty: 35 tablet, Refills: 1    fluticasone (FLONASE) 50 MCG/ACT nasal spray Place 1 spray into both nostrils daily as needed for allergies. Reported on 09/17/2015 Refills: 12    ondansetron (ZOFRAN) 8 MG tablet Take 1 tablet (8 mg total) by mouth every 8 (eight) hours as needed for nausea. Qty: 20 tablet, Refills: 6    pantoprazole (PROTONIX) 40 MG tablet TAKE 1 TABLET (40 MG TOTAL) BY MOUTH 2 (TWO) TIMES DAILY. Qty: 180 tablet, Refills: 3      STOP taking these medications     meloxicam (MOBIC) 15 MG tablet        Allergies  Allergen Reactions  . Cefzil [Cefprozil]     Intestinal upset and diarrhea  . Lipitor [Atorvastatin] Other (See Comments)    Myalgia   Follow-up Information    Follow up with Sallee Lange, MD In 2 weeks.   Specialty:  Family Medicine   Contact information:   [address] [place] [address] [phone]        The results of significant diagnostics from this hospitalization (including imaging, microbiology, ancillary and laboratory) are listed below for reference.    Significant Diagnostic Studies: Ct Abdomen Pelvis W Contrast  09/19/2015  CLINICAL DATA:  Nausea, vomiting and left upper quadrant pain. EXAM: CT ABDOMEN AND PELVIS WITH CONTRAST TECHNIQUE: Multidetector CT imaging of the abdomen and pelvis was performed using the standard protocol following bolus administration of intravenous contrast. CONTRAST:  173mL OMNIPAQUE IOHEXOL 300 MG/ML  SOLN COMPARISON:  April 17, 2009 FINDINGS: Lower chest: There minimal atelectasis of posterior lung bases. There is no focal pneumonia or pleural effusion. The heart size is normal. Hepatobiliary: There is diffuse low density of liver without vessel displacement. No focal liver lesion is identified. The gallbladder  is normal. The biliary tree is normal. Pancreas: No mass, inflammatory changes, or other significant abnormality. Spleen: Within normal limits in size and appearance. Adrenals/Urinary Tract: No masses identified. No evidence of hydronephrosis. Stomach/Bowel: No evidence of obstruction, inflammatory process, or abnormal fluid collections. The appendix is normal. Vascular/Lymphatic: No pathologically enlarged lymph nodes. No evidence of abdominal aortic aneurysm. Reproductive: No mass or other significant abnormality. Other: None. Musculoskeletal: No acute abnormalities identified within the bones. IMPRESSION: No acute abnormality identified in the abdomen pelvis. Fatty infiltration of liver. Electronically Signed   By: Abelardo Diesel M.D.   On: 09/19/2015 15:11    Microbiology: No results found for this or any previous visit (from the past 240 hour(s)).   Labs: Basic Metabolic Panel:  Recent Labs Lab 09/19/15 0218 09/20/15 0535  NA 140 140  K 3.9 3.6  CL 103 107  CO2  27 25  GLUCOSE 123* 102*  BUN 19 10  CREATININE 1.13 0.91  CALCIUM 9.6 8.5*   Liver Function Tests:  Recent Labs Lab 09/19/15 0218 09/20/15 0535  AST 47* 39  ALT 97* 75*  ALKPHOS 98 71  BILITOT 0.9 0.6  PROT 8.8* 6.5  ALBUMIN 4.8 3.6    Recent Labs Lab 09/19/15 0218  LIPASE 22   No results for input(s): AMMONIA in the last 168 hours. CBC:  Recent Labs Lab 09/19/15 0218 09/20/15 0535  WBC 11.5* 8.3  NEUTROABS 6.6  --   HGB 17.1* 13.9  HCT 49.6 41.1  MCV 87.0 87.3  PLT 397 296   Cardiac Enzymes: No results for input(s): CKTOTAL, CKMB, CKMBINDEX, TROPONINI in the last 168 hours. BNP: BNP (last 3 results) No results for input(s): BNP in the last 8760 hours.  ProBNP (last 3 results) No results for input(s): PROBNP in the last 8760 hours.  CBG: No results for input(s): GLUCAP in the last 168 hours.     Signed:  Charlynne Cousins MD.  [facility] 09/21/2015, 3:49 PM

## 2015-09-21 NOTE — Progress Notes (Addendum)
[facility] PROGRESS NOTE    Progress Note   Bryan Gilbert [id number] DOB: [birth year] DOA: 09/19/2015 PCP: Sallee Lange, MD   Brief Narrative:   Bryan Gilbert is an 50 y.o. male comes in for abdominal pain.  Assessment/Plan:   Abdominal pain, left upper quadrant/  Cyclic vomiting syndrome/mild elevation in LTF's: Acute hepatitis panel negative. Season started to improve. CT scan of the abdomen pelvis shows no acute finding, only fatty liver. Change Protonix to orals, DC IV fluids. Change narcotics to orals. Resume home meds. If tolerates regular diet and probably to be discharged in the morning.  Essential  HTN (hypertension): Blood pressure has been stable continue to monitor.  Polycythemia, secondary: His hemoglobin was 17 [place] 13 she was likely hemoconcentrated on admission.  Cervical neuralgia: Continue IV pain medication.  Narcotic seeking behavior: He keeps insisting on being on IV Dilaudid. I have explained to him that if he is to go home in the morning when he changes regimen was normal regimen which she can take at home. He keeps instigating the nurses on having IV narcotics. Or additional oral medication for pain control.     DVT Prophylaxis - Lovenox ordered.  Family Communication: Wife Disposition Plan: Home in 1 days Code Status:     Code Status Orders        Start     Ordered   09/19/15 1132  Full code   Continuous     09/19/15 1131    Code Status History    Date Active Date Inactive Code Status Order ID Comments User Context   06/09/2013 10:54 AM 06/09/2013  7:22 PM Full Code [id number]  Kathie Dike, MD Inpatient   02/11/2012  4:39 PM 02/12/2012  2:36 PM Full Code [id number]  Rexene Alberts, MD Inpatient        IV Access:    Peripheral IV   Procedures and diagnostic studies:   Ct Abdomen Pelvis W Contrast  09/19/2015  CLINICAL DATA:  Nausea, vomiting and left upper quadrant pain. EXAM: CT ABDOMEN AND PELVIS WITH CONTRAST TECHNIQUE:  Multidetector CT imaging of the abdomen and pelvis was performed using the standard protocol following bolus administration of intravenous contrast. CONTRAST:  141mL OMNIPAQUE IOHEXOL 300 MG/ML  SOLN COMPARISON:  April 17, 2009 FINDINGS: Lower chest: There minimal atelectasis of posterior lung bases. There is no focal pneumonia or pleural effusion. The heart size is normal. Hepatobiliary: There is diffuse low density of liver without vessel displacement. No focal liver lesion is identified. The gallbladder is normal. The biliary tree is normal. Pancreas: No mass, inflammatory changes, or other significant abnormality. Spleen: Within normal limits in size and appearance. Adrenals/Urinary Tract: No masses identified. No evidence of hydronephrosis. Stomach/Bowel: No evidence of obstruction, inflammatory process, or abnormal fluid collections. The appendix is normal. Vascular/Lymphatic: No pathologically enlarged lymph nodes. No evidence of abdominal aortic aneurysm. Reproductive: No mass or other significant abnormality. Other: None. Musculoskeletal: No acute abnormalities identified within the bones. IMPRESSION: No acute abnormality identified in the abdomen pelvis. Fatty infiltration of liver. Electronically Signed   By: Abelardo Diesel M.D.   On: 09/19/2015 15:11     Medical Consultants:    None.  Anti-Infectives:   Anti-infectives    None      Subjective:    Bryan Gilbert still requiring high doses of pain medication, will like to try a soft diet.  Objective:    Filed Vitals:   09/20/15 1819 09/20/15 2219 09/21/15 0005 09/21/15 [id number]  BP: 127/80 127/82 124/84 126/82  Pulse: 74 64 71 67  Temp: 97.7 F (36.5 C) 98.7 F (37.1 C) 98.9 F (37.2 C) 98.4 F (36.9 C)  TempSrc: Oral Oral Oral Oral  Resp: 20 20 20 20   Height:      Weight:      SpO2: 98% 99% 99% 100%    Intake/Output Summary (Last 24 hours) at 09/21/15 1024 Last data filed at 09/20/15 1900  Gross per 24 hour  Intake    2710 ml  Output    900 ml  Net   1810 ml   Filed Weights   09/19/15 0039 09/19/15 1154  Weight: 99.791 kg (220 lb) 102.1 kg (225 lb 1.4 oz)    Exam: Gen:  NAD Cardiovascular:  RRR. Chest and lungs:   CTAB Abdomen:  Abdomen soft, NT/ND, + BS Extremities:  No edema   Data Reviewed:    Labs: Basic Metabolic Panel:  Recent Labs Lab 09/19/15 0218 09/20/15 0535  NA 140 140  K 3.9 3.6  CL 103 107  CO2 27 25  GLUCOSE 123* 102*  BUN 19 10  CREATININE 1.13 0.91  CALCIUM 9.6 8.5*   GFR Estimated Creatinine Clearance: 111.8 mL/min (by C-G formula based on Cr of 0.91). Liver Function Tests:  Recent Labs Lab 09/19/15 0218 09/20/15 0535  AST 47* 39  ALT 97* 75*  ALKPHOS 98 71  BILITOT 0.9 0.6  PROT 8.8* 6.5  ALBUMIN 4.8 3.6    Recent Labs Lab 09/19/15 0218  LIPASE 22   No results for input(s): AMMONIA in the last 168 hours. Coagulation profile No results for input(s): INR, PROTIME in the last 168 hours.  CBC:  Recent Labs Lab 09/19/15 0218 09/20/15 0535  WBC 11.5* 8.3  NEUTROABS 6.6  --   HGB 17.1* 13.9  HCT 49.6 41.1  MCV 87.0 87.3  PLT 397 296   Cardiac Enzymes: No results for input(s): CKTOTAL, CKMB, CKMBINDEX, TROPONINI in the last 168 hours. BNP (last 3 results) No results for input(s): PROBNP in the last 8760 hours. CBG: No results for input(s): GLUCAP in the last 168 hours. D-Dimer: No results for input(s): DDIMER in the last 72 hours. Hgb A1c: No results for input(s): HGBA1C in the last 72 hours. Lipid Profile: No results for input(s): CHOL, HDL, LDLCALC, TRIG, CHOLHDL, LDLDIRECT in the last 72 hours. Thyroid function studies: No results for input(s): TSH, T4TOTAL, T3FREE, THYROIDAB in the last 72 hours.  Invalid input(s): FREET3 Anemia work up: No results for input(s): VITAMINB12, FOLATE, FERRITIN, TIBC, IRON, RETICCTPCT in the last 72 hours. Sepsis Labs:  Recent Labs Lab 09/19/15 0218 09/20/15 0535  WBC 11.5* 8.3    Microbiology No results found for this or any previous visit (from the past 240 hour(s)).   Medications:   . enoxaparin (LOVENOX) injection  50 mg Subcutaneous Q24H  . feeding supplement  1 Container Oral TID BM  . pantoprazole  40 mg Oral Daily  . pantoprazole (PROTONIX) IV  40 mg Intravenous Q12H  . sertraline  200 mg Oral Daily  . zolpidem  10 mg Oral QHS   Continuous Infusions: . sodium chloride 100 mL/hr at 09/21/15 0952    Time spent: 25 min   LOS: 1 day   Charlynne Cousins  [facility] Pager [phone]  *Please refer to [email or web address], password [email or web address] to get updated schedule on who will round on this patient, as hospitalists switch teams weekly. If 7PM-7AM, please contact night-coverage at [email or web address], password [email or web address]  for any overnight needs.  09/21/2015, 10:24 AM

## 2015-09-23 ENCOUNTER — Other Ambulatory Visit: Payer: Self-pay | Admitting: Family Medicine

## 2015-09-23 NOTE — Telephone Encounter (Signed)
May refill the noncontrolled medications 12. As for Valium May refill this in one additional refill

## 2015-10-05 ENCOUNTER — Emergency Department (HOSPITAL_COMMUNITY)
Admission: EM | Admit: 2015-10-05 | Discharge: 2015-10-05 | Disposition: A | Discharge status: Home / Self Care | Payer: Managed Care, Other (non HMO) | Attending: Emergency Medicine | Admitting: Emergency Medicine

## 2015-10-05 ENCOUNTER — Encounter (HOSPITAL_COMMUNITY): Payer: Self-pay | Admitting: *Deleted

## 2015-10-05 ENCOUNTER — Emergency Department (HOSPITAL_COMMUNITY): Payer: Managed Care, Other (non HMO)

## 2015-10-05 DIAGNOSIS — R1012 Left upper quadrant pain: Secondary | ICD-10-CM | POA: Insufficient documentation

## 2015-10-05 DIAGNOSIS — Z79891 Long term (current) use of opiate analgesic: Secondary | ICD-10-CM | POA: Insufficient documentation

## 2015-10-05 DIAGNOSIS — I10 Essential (primary) hypertension: Secondary | ICD-10-CM | POA: Diagnosis not present

## 2015-10-05 DIAGNOSIS — Z79899 Other long term (current) drug therapy: Secondary | ICD-10-CM | POA: Diagnosis not present

## 2015-10-05 DIAGNOSIS — R109 Unspecified abdominal pain: Secondary | ICD-10-CM

## 2015-10-05 DIAGNOSIS — G8929 Other chronic pain: Secondary | ICD-10-CM | POA: Diagnosis not present

## 2015-10-05 LAB — CBC WITH DIFFERENTIAL/PLATELET
Basophils Absolute: 0 10*3/uL (ref 0.0–0.1)
Basophils Relative: 0 %
Eosinophils Absolute: 0 10*3/uL (ref 0.0–0.7)
Eosinophils Relative: 0 %
HCT: 44.7 % (ref 39.0–52.0)
Hemoglobin: 15.7 g/dL (ref 13.0–17.0)
Lymphocytes Relative: 20 %
Lymphs Abs: 2.3 10*3/uL (ref 0.7–4.0)
MCH: 30.1 pg (ref 26.0–34.0)
MCHC: 35.1 g/dL (ref 30.0–36.0)
MCV: 85.6 fL (ref 78.0–100.0)
Monocytes Absolute: 0.7 10*3/uL (ref 0.1–1.0)
Monocytes Relative: 6 %
Neutro Abs: 8.6 10*3/uL — ABNORMAL HIGH (ref 1.7–7.7)
Neutrophils Relative %: 74 %
Platelets: 409 10*3/uL — ABNORMAL HIGH (ref 150–400)
RBC: 5.22 MIL/uL (ref 4.22–5.81)
RDW: 12.1 % (ref 11.5–15.5)
WBC: 11.6 10*3/uL — ABNORMAL HIGH (ref 4.0–10.5)

## 2015-10-05 LAB — COMPREHENSIVE METABOLIC PANEL
ALT: 62 U/L (ref 17–63)
AST: 26 U/L (ref 15–41)
Albumin: 4.4 g/dL (ref 3.5–5.0)
Alkaline Phosphatase: 85 U/L (ref 38–126)
Anion gap: 10 (ref 5–15)
BUN: 16 mg/dL (ref 6–20)
CO2: 25 mmol/L (ref 22–32)
Calcium: 9.1 mg/dL (ref 8.9–10.3)
Chloride: 103 mmol/L (ref 101–111)
Creatinine, Ser: 0.79 mg/dL (ref 0.61–1.24)
GFR calc Af Amer: >60 mL/min (ref >60)
GFR calc non Af Amer: >60 mL/min (ref >60)
Glucose, Bld: 137 mg/dL — ABNORMAL HIGH (ref 65–99)
Potassium: 3.8 mmol/L (ref 3.5–5.1)
Sodium: 138 mmol/L (ref 135–145)
Total Bilirubin: 0.8 mg/dL (ref 0.3–1.2)
Total Protein: 7.8 g/dL (ref 6.5–8.1)

## 2015-10-05 LAB — URINALYSIS, ROUTINE W REFLEX MICROSCOPIC
Bilirubin Urine: NEGATIVE
Glucose, UA: NEGATIVE mg/dL
Hgb urine dipstick: NEGATIVE
Ketones, ur: NEGATIVE mg/dL
Leukocytes, UA: NEGATIVE
Nitrite: NEGATIVE
Protein, ur: NEGATIVE mg/dL
Specific Gravity, Urine: 1.025 (ref 1.005–1.030)
pH: 6 (ref 5.0–8.0)

## 2015-10-05 LAB — LIPASE, BLOOD: Lipase: 26 U/L (ref 11–51)

## 2015-10-05 LAB — I-STAT CG4 LACTIC ACID, ED: Lactic Acid, Venous: 1.34 mmol/L (ref 0.5–2.0)

## 2015-10-05 MED ORDER — ONDANSETRON HCL 4 MG/2ML IJ SOLN
4.0000 mg | Freq: Once | INTRAMUSCULAR | Status: AC
  Administered 2015-10-05: 4 mg via INTRAVENOUS
  Filled 2015-10-05: qty 2

## 2015-10-05 MED ORDER — HYDROMORPHONE HCL 1 MG/ML IJ SOLN
1.0000 mg | Freq: Once | INTRAMUSCULAR | Status: AC
  Administered 2015-10-05: 1 mg via INTRAVENOUS
  Filled 2015-10-05: qty 1

## 2015-10-05 MED ORDER — DIPHENHYDRAMINE HCL 50 MG/ML IJ SOLN
25.0000 mg | Freq: Once | INTRAMUSCULAR | Status: AC
  Administered 2015-10-05: 25 mg via INTRAVENOUS
  Filled 2015-10-05: qty 1

## 2015-10-05 MED ORDER — SODIUM CHLORIDE 0.9 % IV BOLUS (SEPSIS)
1000.0000 mL | Freq: Once | INTRAVENOUS | Status: AC
  Administered 2015-10-05: 1000 mL via INTRAVENOUS

## 2015-10-05 MED ORDER — METOCLOPRAMIDE HCL 5 MG/ML IJ SOLN
10.0000 mg | Freq: Once | INTRAMUSCULAR | Status: AC
Start: 2015-10-05 — End: 2015-10-05
  Administered 2015-10-05: 10 mg via INTRAVENOUS
  Filled 2015-10-05: qty 2

## 2015-10-05 MED ORDER — LORAZEPAM 2 MG/ML IJ SOLN
1.0000 mg | Freq: Once | INTRAMUSCULAR | Status: DC
  Filled 2015-10-05: qty 1

## 2015-10-05 MED ORDER — PANTOPRAZOLE SODIUM 40 MG IV SOLR
40.0000 mg | Freq: Once | INTRAVENOUS | Status: AC
  Administered 2015-10-05: 40 mg via INTRAVENOUS
  Filled 2015-10-05: qty 40

## 2015-10-05 NOTE — ED Notes (Signed)
Pt complaining of abdominal pain that started 2 day ago.

## 2015-10-05 NOTE — ED Provider Notes (Signed)
CSN: [id number]     Arrival date & time 10/05/15  0609 History   First MD Initiated Contact with Patient 10/05/15 [phone]     Chief Complaint  Patient presents with  . Abdominal Pain     (Consider location/radiation/quality/duration/timing/severity/associated sxs/prior Treatment) HPI Comments: With history of "abdominal migraines" diagnosed at [facility] by his neurologist Dr. Theda Sers. He states his pain is under control over the past 3 days and it is turning into cyclic vomiting. He had 2 recent admissions once to [facility] once [facility] for intractable pain and vomiting in the past 2 weeks. States he is out of his Stadol nasal spray which controls his nausea and vomiting. He states he's not been able to keep anything down with a little bit of water over the past several days. He's had some diarrhea as well. There is no fever. The pain is in his left upper quadrant exactly as his previous episodes. Pain is constant. Nothing makes it better or worse. He denies any chest pain or shortness of breath. He denies any change in his typical pain exacerbations. His wife states that they cannot refill the Stadol for another 2 weeks. They're told to come to the ER by his doctor.  Patient is a 50 y.o. male presenting with abdominal pain. The history is provided by the patient and a relative.  Abdominal Pain Associated symptoms: diarrhea, fatigue, nausea and vomiting   Associated symptoms: no chest pain, no cough, no dysuria, no fever, no hematuria and no shortness of breath     Past Medical History  Diagnosis Date  . Cyclical vomiting   . Hypertension   . Slipped intervertebral disc   . Sleep apnea     states uses O2 and CPAP at night  . Cervical neuralgia   . Elevated hemoglobin ([place])   . Abdominal migraine    Past Surgical History  Procedure Laterality Date  . Esophagogastroduodenoscopy     Family History  Problem Relation Age of Onset  . Heart failure Father   . Hypertension Father    Social History   Substance Use Topics  . Smoking status: Never Smoker   . Smokeless tobacco: Never Used  . Alcohol Use: No    Review of Systems  Constitutional: Positive for activity change, appetite change and fatigue. Negative for fever.  HENT: Negative for congestion.   Eyes: Negative for visual disturbance.  Respiratory: Negative for cough, chest tightness and shortness of breath.   Cardiovascular: Negative for chest pain.  Gastrointestinal: Positive for nausea, vomiting, abdominal pain and diarrhea.  Genitourinary: Negative for dysuria, hematuria and testicular pain.  Musculoskeletal: Negative for myalgias and arthralgias.  Skin: Negative for rash.  Neurological: Negative for dizziness and headaches.  A complete 10 system review of systems was obtained and all systems are negative except as noted in the HPI and PMH.      Allergies  Cefzil and Lipitor  Home Medications   Prior to Admission medications   Medication Sig Start Date End Date Taking? Authorizing Provider  butorphanol (STADOL) 10 MG/ML nasal spray Place 1 spray into the nose every 4 (four) hours as needed for headache. Reported on 09/17/2015   Yes Historical Provider, MD  chlorzoxazone (PARAFON) 500 MG tablet TAKE 1 TABLET BY MOUTH 3 TIMES A DAY AS NEEDED FOR MUSCLE SPASMS 09/23/15  Yes Kathyrn Drown, MD  diazepam (VALIUM) 5 MG tablet TAKE 1 TABLET BY MOUTH EVERY 6 HOURS AS NEEDED 09/23/15  Yes Kathyrn Drown, MD  HYDROcodone-acetaminophen (NORCO) 10-325 MG tablet Take one every 4 hours as needed for pain. One up to 6 times daily. 09/17/15  Yes Kathyrn Drown, MD  lisinopril-hydrochlorothiazide (PRINZIDE,ZESTORETIC) 20-12.5 MG tablet TAKE 1 TABLET EVERY DAY 04/22/15  Yes Kathyrn Drown, MD  loperamide (IMODIUM) 2 MG capsule Take 2 mg by mouth as needed for diarrhea or loose stools.   Yes Historical Provider, MD  ondansetron (ZOFRAN) 8 MG tablet Take 1 tablet (8 mg total) by mouth every 8 (eight) hours as needed for nausea. 10/03/12   Yes Kathyrn Drown, MD  pantoprazole (PROTONIX) 40 MG tablet TAKE 1 TABLET (40 MG TOTAL) BY MOUTH 2 (TWO) TIMES DAILY. 09/23/15  Yes Kathyrn Drown, MD  SALINE NASAL SPRAY NA Place 1 spray into the nose daily as needed (dryness). Reported on 09/17/2015   Yes Historical Provider, MD  simethicone (MYLICON) 80 MG chewable tablet Chew 80 mg by mouth every 6 (six) hours as needed for flatulence.   Yes Historical Provider, MD  zolpidem (AMBIEN) 10 MG tablet TAKE 1 TABLET BY MOUTH AT BEDTIME 03/27/15  Yes Kathyrn Drown, MD  sertraline (ZOLOFT) 100 MG tablet 2 qd Patient not taking: Reported on 10/05/2015 06/18/15   Kathyrn Drown, MD   BP 136/86 mmHg  Pulse 76  Temp(Src) 98.8 F (37.1 C) (Oral)  Resp 14  Ht 5\' 7"  (1.702 m)  Wt 210 lb (95.255 kg)  BMI 32.88 kg/m2  SpO2 96% Physical Exam  Constitutional: He is oriented to person, place, and time. He appears well-developed and well-nourished. No distress.  Laying sideways in bed with head on the bed rail hunched over  HENT:  Head: Normocephalic and atraumatic.  Mouth/Throat: Oropharynx is clear and moist. No oropharyngeal exudate.  Eyes: Conjunctivae and EOM are normal. Pupils are equal, round, and reactive to light.  Neck: Normal range of motion. Neck supple.  No meningismus.  Cardiovascular: Normal rate, regular rhythm, normal heart sounds and intact distal pulses.   No murmur heard. Pulmonary/Chest: Effort normal and breath sounds normal. No respiratory distress.  Abdominal: Soft. There is tenderness. There is no rebound and no guarding.  Abdomen is soft, LUQ tenderness. No guarding or rebound.  Musculoskeletal: Normal range of motion. He exhibits no edema or tenderness.  Neurological: He is alert and oriented to person, place, and time. No cranial nerve deficit. He exhibits normal muscle tone. Coordination normal.  No ataxia on finger to nose bilaterally. No pronator drift. 5/5 strength throughout. CN 2-12 intact.Equal grip strength. Sensation  intact.   Skin: Skin is warm.  Psychiatric: He has a normal mood and affect. His behavior is normal.  Nursing note and vitals reviewed.   ED Course  Procedures (including critical care time) Labs Review Labs Reviewed  CBC WITH DIFFERENTIAL/PLATELET - Abnormal; Notable for the following:    WBC 11.6 (*)    Platelets 409 (*)    Neutro Abs 8.6 (*)    All other components within normal limits  COMPREHENSIVE METABOLIC PANEL - Abnormal; Notable for the following:    Glucose, Bld 137 (*)    All other components within normal limits  URINALYSIS, ROUTINE W REFLEX MICROSCOPIC (NOT AT [facility]) - Abnormal; Notable for the following:    APPearance HAZY (*)    All other components within normal limits  LIPASE, BLOOD  I-STAT CG4 LACTIC ACID, ED  I-STAT CG4 LACTIC ACID, ED    Imaging Review Dg Abd Acute W/chest  10/05/2015  CLINICAL DATA:  Acute generalized  abdominal pain. EXAM: DG ABDOMEN ACUTE W/ 1V CHEST COMPARISON:  May 19, 2013. FINDINGS: There is no evidence of dilated bowel loops or free intraperitoneal air. No radiopaque calculi or other significant radiographic abnormality is seen. Heart size and mediastinal contours are within normal limits. Both lungs are clear. IMPRESSION: Negative abdominal radiographs.  No acute cardiopulmonary disease. Electronically Signed   By: Marijo Conception, M.D.   On: 10/05/2015 08:43   I have personally reviewed and evaluated these images and lab results as part of my medical decision-making.   EKG Interpretation None      MDM   Final diagnoses:  Chronic abdominal pain   Acute on chronic abdominal pain with recurrent nausea and vomiting. Abdomen is soft without peritoneal signs. Recent CT scan on March 16 was reviewed and is unremarkable.  Labs appear to be at baseline. Patient feels improved. Acute abdominal series negative. Lactate normal.  After IV fluids, pain and nausea medications he is feeling better. He is tolerating by mouth. He states  his abdominal pain has resolved. He has follow up with his specialists next week. Return precautions discussed. He has his other medications at home.    Instructed to call specialist at [facility] for a sooner appointment. There is no evidence of dehydration or need for admission at this time. Patient wishing to go home. No vomiting noted in the ED. His pain has improved with treatment in the ED and he is requesting discharge. Return precautions discussed.   Ezequiel Essex, MD 10/05/15 1335

## 2015-10-05 NOTE — Discharge Instructions (Signed)

## 2015-10-05 NOTE — ED Notes (Signed)
No vomiting noted during ER course.

## 2015-10-19 ENCOUNTER — Other Ambulatory Visit: Payer: Self-pay | Admitting: Family Medicine

## 2015-11-16 ENCOUNTER — Other Ambulatory Visit: Payer: Self-pay | Admitting: Family Medicine

## 2015-11-18 NOTE — Telephone Encounter (Signed)
Ok six mo worth 

## 2015-12-12 ENCOUNTER — Ambulatory Visit (INDEPENDENT_AMBULATORY_CARE_PROVIDER_SITE_OTHER): Payer: Managed Care, Other (non HMO) | Admitting: Family Medicine

## 2015-12-12 ENCOUNTER — Encounter: Payer: Self-pay | Admitting: Family Medicine

## 2015-12-12 VITALS — BP 106/74 | Ht 66.0 in | Wt 209.5 lb

## 2015-12-12 DIAGNOSIS — R748 Abnormal levels of other serum enzymes: Secondary | ICD-10-CM

## 2015-12-12 DIAGNOSIS — Z139 Encounter for screening, unspecified: Secondary | ICD-10-CM | POA: Diagnosis not present

## 2015-12-12 DIAGNOSIS — Z5189 Encounter for other specified aftercare: Secondary | ICD-10-CM | POA: Diagnosis not present

## 2015-12-12 DIAGNOSIS — Z13220 Encounter for screening for lipoid disorders: Secondary | ICD-10-CM

## 2015-12-12 DIAGNOSIS — Z79891 Long term (current) use of opiate analgesic: Secondary | ICD-10-CM | POA: Diagnosis not present

## 2015-12-12 DIAGNOSIS — I10 Essential (primary) hypertension: Secondary | ICD-10-CM | POA: Diagnosis not present

## 2015-12-12 DIAGNOSIS — R52 Pain, unspecified: Secondary | ICD-10-CM

## 2015-12-12 MED ORDER — HYDROCODONE-ACETAMINOPHEN 10-325 MG PO TABS: ORAL_TABLET | ORAL | Status: DC

## 2015-12-12 MED ORDER — LISINOPRIL-HYDROCHLOROTHIAZIDE 10-12.5 MG PO TABS: 1.0000 | ORAL_TABLET | Freq: Every day | ORAL | Status: DC

## 2015-12-12 NOTE — Progress Notes (Signed)
   Subjective:    Patient ID: Bryan Gilbert, male    DOB: [birth year], 50 y.o.   MRN: [id number]  HPI  This patient was seen today for chronic pain  The medication list was reviewed and updated.   -Compliance with medication: Yes  - Number patient states they take daily: Patient states 6 per day.  -when was the last dose patient took? Patient states last dose 1 hr ago.   The patient was advised the importance of maintaining medication and not using illegal substances with these.  Refills needed: Yes   The patient was educated that we can provide 3 monthly scripts for their medication, it is their responsibility to follow the instructions.  Side effects or complications from medications: No side effects currently  Patient is aware that pain medications are meant to minimize the severity of the pain to allow their pain levels to improve to allow for better function. They are aware of that pain medications cannot totally remove their pain.  Due for UDT ( at least once per year) : Yes (urine drug screen today. We did discuss 2 separate hospitalizations at [facility] for cyclical vomiting syndrome and they treated him with Stadol they will through a lot of GI testing and came to the same conclusion that Stadol seem to be the best to help him States no other concerns this visit.  Patient has been doing well with losing weight watching diet states he feels he's doing is good as he can with his health He does state Ambien helps with the sleep He states his moods been doing good He relates pain medication doing well taking the edge off pain making things more tolerable  Review of Systems    he denies any chest tightness pressure pain shortness breath nausea from diarrhea high fever chills sweats does relate neck pain and low back pain Objective:   Physical Exam Lungs clear hearts regular pulse normal BP good extremities no edema skin warm dry       Assessment & Plan:    Insomnia uses Ambien denies abusing it does not take pain medicine at the same time states it helps him get at least 5 hours sleep Blood pressure patient is lost some weight therefore does not need as much medication reduce the medication patient with significant problems with cyclical vomiting syndrome follows through with specialist uses Stadol with this he was cautioned greatly not to use Stadol in his pain medicine at the same time because of increased risk of rest or depression and premature death Hyperlipidemia check lipid profile Elevated liver enzymes in the past previous lab work reviewed check liver profile Screening PSA recommended

## 2015-12-12 NOTE — Patient Instructions (Signed)
Narcotic medication treatment agreement-educational material and consent form. Your health problem-because you are having problems with pain you are being prescribed narcotic medication to help control your pain. The purpose of narcotic medication-narcotics are a type of drug that should help you with your pain and let you be more active in your daily life. It is not expected that your pain will go away completely. There are risks associated with these drugs and you can also have side effects. It is important for you to be honest with your doctor about your pain and the dose of medication you are taking. It is also important to be honest with your doctor about any potential problems or side effects with the medications that you are having. Risk and common problems-narcotic use has been associated with the following issues Addiction- there is a chance that you could become addicted to narcotic drugs. This means that you once the drug and will try very hard to get it, even if it causes you harm or other problems in your life. This chance is greater in people who are young, have mental illness, have been addicted to any drug in the past, or have a close relative that has been addicted to a drug in the past. Your doctor may tell you that you need tests or should see other health providers to help you avoid addiction. Allergic reaction - all kinds of allergic reactions can happen. You could have a minor reaction such as a rash or severe reaction such as swelling of your tongue or throat. A severe reaction as a medical emergency that can cause death. Incomplete relief of pain - narcotic medications do not take away all of your pain. Your doctor will work with you to try to optimize treatment but it is not reasonable to expect complete relief of pain. Low testosterone levels in men - narcotic drugs may cause the levels of the hormone testosterone to drop in men. This could change your mood and energy level. It may  also lessen the desire to have sex. Testosterone supplementation in this situation is not recommended. Physical dependence- you may not feel well if your dose is suddenly stopped. Some common symptoms of withdraw are runny nose, excessive yawning, goosebumps, nausea with stomach pains, diarrhea, body aches, and increased irritability. Side effects- there are many side effects of narcotic drugs. Constipation, nausea, vomiting, itching, dizziness are all potential side effects. Slowed breathing- excessive doses of narcotics can slow your breathing. Do not use other drugs or drink alcohol while taking narcotic drugs. This can cause accidental death. Follow directions on how the medication is prescribed. If you feel you are having problems then notify your doctor. Slowed reaction time- you may feel sleepy and be slow to react. If this happens, then you should not drive, use heavy machinery or guns, or be at unsafe heights, or be caring for someone else. Tolerance- your body could become use to the dose of narcotic drugs that your doctor tells you to take, and you may not get the same relief of pain that you had before. A higher dose may not help and could cause potential side effects. Increased risk of accidental death can occur due to the narcotic medication or side effects. If you are having any of the problems listed above you need to discuss these with your physician.  Other choices- you do not have to take narcotics. The decision to take narcotics for pain his ureters. There are other choices that you may choose.  There are several alternatives that may be helpful. These can be done in place of narcotics or in some cases along with narcotics. - Anti-inflammatories, antidepressants, and seizure medications can be helpful -Physical therapy, wearing a brace, surgical referral, home exercise regimens, could potentially help -Referral to a specialist-you may wish to see a specialist who specializes in pain  management -You can choose to do nothing and live with the pain you have -Your doctor will discuss with you your choices but the decision his ureters. How well any other treatment works will depend on your specific health problem. More facts-there may be local, [place], or federal laws that your doctor must follow with prescribing narcotic drugs. It is not clear if narcotic painkillers are good for you to take for a long period of time. You should discuss with your doctor often about the good and bad effects that these drugs may have on you.  You should not take narcotic drugs if you are pregnant. If you become pregnant notify your doctor right away. Narcotic drugs can raise a chance of having a miscarriage or having a baby born with a birth defect. Your baby can also be born addicted to the drug. Should you become pregnant you will have to discontinue narcotic use. Treatment agreement - by signing the consent form you agree that you understand the rules for taking narcotic drugs. If you do not follow these rules, then your doctor may refer you to a specialist, no longer prescribe pain medications for you, and release you/terminate you from his or her care. Drug safety-you must lock your drugs in a safe place. They must be kept away from children. We will also were review with you the right way to get rid of any extra drugs.  You may not sell, share, or let other people use your drugs. This is a crime and can cause overdoses. You may be asked to come to our office between scheduled appointments for random pill counts. Failure to comply with this will result in dismissal. Instructions for taking narcotic drugs-you are only to take pain medications that is prescribed by our office. Do not combine your pain medication with other physician narcotic pain medications or other peoples pain medications. Do not stop taking your narcotic suddenly.  Do not drive after a new pain drug is started or after a dose is  increased until you are sure it does not make you sleepy or confused. Do not try to cut or crush your drug unless told to do so. This could cause death. Your drug will be stopped if it is not helping enough or if it is showing signs of harm to you. You must tell your doctor about any new drugs or health problems. Your drug may not work well or may work differently if you have certain health problems.  You must tell your doctor about problems you have with any drugs prescribed or illegal. If you feel you're having an addiction problem with prescribed or illegal drugs you will discuss this with your doctor in order to be referred for treatment.  Your doctor reserves the right to limit other medications that can interact with pain medications. Prescriptions and refills- your narcotic drugs will be prescribed by our office only. You may not ask for pain drugs from any other doctors including emergency room doctors. Our office will decide how many refills you will be given and how often he will need to be seen in our office in order to get  them area at the very least every 3 month appointments are required. Never try to change a prescription. If you do this then it will be reported to the police. You will also be terminated from the practice. Your prescription will not be replaced if lost, stolen, or destroyed by accident. Patients on regular narcotics must keep their office visits on a regular basis-always every 3 months or less. It is at that appointment they will receive their prescriptions. Do not call for an additional month supply. An office visit is necessary. Appointments- you will keep all appointments with doctors, therapist, and counselors. If you miss your scheduled appointments often, then your doctor may slowly decrease your dose of narcotic drugs until you are no longer taking it. The dates when your prescription was filled at the pharmacy will be per 5. The state wide database will be checked at  standard visits to make sure the patient is not receiving pain prescriptions from other physicians.  Random drug testing for illegal substances will be standard. Your doctor may have you give urine, blood, hair, or saliva to run tests. If you have test results that are not normal then your doctor may slowly decrease your dose of narcotic drugs until you are no longer taking the medication or stop prescribing additional pain medications immediately. Refusal to do urine drug testing is basis for the practice no longer prescribe narcotic pain medications. Pain management specialist If your provider feels it is in your best interest to see a pain medicine specialist we will advise you have such and help you with the referral. Sometimes this referral is made because standard dosing of short acting medication is no longer keeping the patient's pain under reasonable control. Sometimes this is based upon a patient's health issue, and it is felt that pain management would best serve the patient's needs.  Once under the care of pain management we will no longer be prescribing the narcotic medications. This will be under the guidance of the pain management specialist. Further pain medication prescriptions will not be reassumed by this office once referred to pain management. In these situations we will continue to provide primary care but not pain medications.  Violations of the pain management agreement will result in our office no longer prescribing pain medications. In some situations it will also result in dismissal of the patient from our practice.  Health information-your doctor may need to discuss your treatment with pharmacists or other providers. Legal authorities may ask for your pain treatment records. If this happens then records will be given to them up on proper documentation/release.  You should also be aware that properly taking pain medications is very important in regards to operating a vehicle.  Even with following proper prescriptions instructions it is possible to be charged with operating a vehicle under influence. It is highly important that if you feel drowsy or drug that you do not operate a motor vehicle for the safety of yourself and others.

## 2015-12-17 ENCOUNTER — Ambulatory Visit: Payer: Managed Care, Other (non HMO) | Admitting: Family Medicine

## 2015-12-20 LAB — PLEASE NOTE

## 2015-12-20 LAB — TOXASSURE SELECT 13 (MW), URINE: PDF: 0

## 2016-02-14 ENCOUNTER — Encounter (HOSPITAL_COMMUNITY): Payer: Self-pay

## 2016-02-14 ENCOUNTER — Emergency Department (HOSPITAL_COMMUNITY): Payer: Managed Care, Other (non HMO)

## 2016-02-14 ENCOUNTER — Emergency Department (HOSPITAL_COMMUNITY)
Admission: EM | Admit: 2016-02-14 | Discharge: 2016-02-14 | Disposition: A | Discharge status: Home / Self Care | Payer: Managed Care, Other (non HMO) | Attending: Emergency Medicine | Admitting: Emergency Medicine

## 2016-02-14 DIAGNOSIS — Y9389 Activity, other specified: Secondary | ICD-10-CM | POA: Diagnosis not present

## 2016-02-14 DIAGNOSIS — Y999 Unspecified external cause status: Secondary | ICD-10-CM | POA: Insufficient documentation

## 2016-02-14 DIAGNOSIS — Y929 Unspecified place or not applicable: Secondary | ICD-10-CM | POA: Insufficient documentation

## 2016-02-14 DIAGNOSIS — X58XXXA Exposure to other specified factors, initial encounter: Secondary | ICD-10-CM | POA: Insufficient documentation

## 2016-02-14 DIAGNOSIS — S93401A Sprain of unspecified ligament of right ankle, initial encounter: Secondary | ICD-10-CM

## 2016-02-14 DIAGNOSIS — M25571 Pain in right ankle and joints of right foot: Secondary | ICD-10-CM | POA: Diagnosis present

## 2016-02-14 DIAGNOSIS — I10 Essential (primary) hypertension: Secondary | ICD-10-CM | POA: Diagnosis not present

## 2016-02-14 MED ORDER — ACETAMINOPHEN 325 MG PO TABS
650.0000 mg | ORAL_TABLET | Freq: Once | ORAL | Status: DC
  Filled 2016-02-14: qty 2

## 2016-02-14 MED ORDER — IBUPROFEN 800 MG PO TABS
800.0000 mg | ORAL_TABLET | Freq: Once | ORAL | Status: DC
  Filled 2016-02-14: qty 1

## 2016-02-14 NOTE — ED Provider Notes (Signed)
[facility] DEPT Provider Note   CSN: [id number] Arrival date & time: 02/14/16  1842  First Provider Contact:  First MD Initiated Contact with Patient 02/14/16 2105        History   Chief Complaint Chief Complaint  Patient presents with  . Ankle Pain    HPI Bryan Gilbert is a 50 y.o. male.  Patient is a 50 year old male who presents to the emergency department with right ankle pain.  The patient states he been having problems with ankle pain in the past. He had scheduled an appointment with a podiatry specialist because of the recurrent see of this pain. Today he was chasing a snake, and reinjured the right ankle. He denies any injury to the hip or knee on. He can bear weight on it, but states that it gives him quite a bit of pain. Certain movements of the ankle cause increased pain. There's been no previous operations or procedures involving the lower extremity. No other injury reported.    Ankle Pain      Past Medical History:  Diagnosis Date  . Abdominal migraine   . Cervical neuralgia   . Cyclical vomiting   . Elevated hemoglobin ([place])   . Hypertension   . Sleep apnea    states uses O2 and CPAP at night  . Slipped intervertebral disc     Patient Active Problem List   Diagnosis Date Noted  . Narcotic dependency, continuous ([place]) 09/21/2015  . Abdominal pain, left upper quadrant 09/19/2015  . Abdominal pain 09/19/2015  . Cervical neuralgia 09/19/2015  . Left upper quadrant pain   . Insomnia 03/02/2014  . Chronic radicular cervical pain 11/01/2013  . Elevated transaminase level 07/10/2013  . Cyclical vomiting syndrome 06/09/2013  . Hypogonadism male 03/15/2013  . Abnormal TSH 02/12/2012  . Hyperglycemia 02/11/2012  . Obstructive sleep apnea 02/11/2012  . Polycythemia, secondary 02/11/2012  . Cyclic vomiting syndrome 07/10/2011  . HTN (hypertension) 07/10/2011  . Metabolic acidosis Q000111Q    Past Surgical History:  Procedure Laterality Date  .  ESOPHAGOGASTRODUODENOSCOPY         Home Medications    Prior to Admission medications   Medication Sig Start Date End Date Taking? Authorizing Provider  butorphanol (STADOL) 10 MG/ML nasal spray Place 1 spray into the nose every 4 (four) hours as needed for headache. Reported on 12/12/2015    Historical Provider, MD  chlorzoxazone (PARAFON) 500 MG tablet TAKE 1 TABLET BY MOUTH 3 TIMES A DAY AS NEEDED FOR MUSCLE SPASMS 09/23/15   Kathyrn Drown, MD  diazepam (VALIUM) 5 MG tablet TAKE 1 TABLET BY MOUTH EVERY 6 HOURS AS NEEDED 09/23/15   Kathyrn Drown, MD  HYDROcodone-acetaminophen (NORCO) 10-325 MG tablet Take one every 4 hours as needed for pain. One up to 6 times daily. 12/12/15   Kathyrn Drown, MD  lisinopril-hydrochlorothiazide (PRINZIDE,ZESTORETIC) 10-12.5 MG tablet Take 1 tablet by mouth daily. 12/12/15   Kathyrn Drown, MD  loperamide (IMODIUM) 2 MG capsule Take 2 mg by mouth as needed for diarrhea or loose stools.    Historical Provider, MD  ondansetron (ZOFRAN) 8 MG tablet Take 1 tablet (8 mg total) by mouth every 8 (eight) hours as needed for nausea. Patient not taking: Reported on 12/12/2015 10/03/12   Kathyrn Drown, MD  pantoprazole (PROTONIX) 40 MG tablet TAKE 1 TABLET (40 MG TOTAL) BY MOUTH 2 (TWO) TIMES DAILY. 09/23/15   Kathyrn Drown, MD  SALINE NASAL SPRAY NA Place 1 spray  into the nose daily as needed (dryness). Reported on 09/17/2015    Historical Provider, MD  sertraline (ZOLOFT) 100 MG tablet 2 qd 06/18/15   Kathyrn Drown, MD  simethicone (MYLICON) 80 MG chewable tablet Chew 80 mg by mouth every 6 (six) hours as needed for flatulence.    Historical Provider, MD  zolpidem (AMBIEN) 10 MG tablet TAKE 1 TABLET AT BEDTIME 11/18/15   Kathyrn Drown, MD    Family History Family History  Problem Relation Age of Onset  . Heart failure Father   . Hypertension Father     Social History Social History  Substance Use Topics  . Smoking status: Never Smoker  . Smokeless tobacco: Never  Used  . Alcohol use No     Allergies   Cefzil [cefprozil] and Lipitor [atorvastatin]   Review of Systems Review of Systems  Constitutional: Negative for activity change.       All ROS Neg except as noted in HPI  HENT: Negative for nosebleeds.   Eyes: Negative for photophobia and discharge.  Respiratory: Negative for cough, shortness of breath and wheezing.   Cardiovascular: Negative for chest pain and palpitations.  Gastrointestinal: Negative for abdominal pain and blood in stool.  Genitourinary: Negative for dysuria, frequency and hematuria.  Musculoskeletal: Positive for arthralgias. Negative for back pain and neck pain.  Skin: Negative.   Neurological: Negative for dizziness, seizures and speech difficulty.  Psychiatric/Behavioral: Negative for confusion and hallucinations.     Physical Exam Updated Vital Signs BP 139/85 (BP Location: Left Arm)   Pulse 107   Temp 98.1 F (36.7 C) (Oral)   Resp 18   Ht 5\' 6"  (1.676 m)   Wt 95.3 kg   SpO2 100%   BMI 33.89 kg/m   Physical Exam  Constitutional: He is oriented to person, place, and time. He appears well-developed and well-nourished.  Non-toxic appearance.  HENT:  Head: Normocephalic.  Right Ear: Tympanic membrane and external ear normal.  Left Ear: Tympanic membrane and external ear normal.  Eyes: EOM and lids are normal. Pupils are equal, round, and reactive to light.  Neck: Normal range of motion. Neck supple. Carotid bruit is not present.  Cardiovascular: Normal rate, regular rhythm, normal heart sounds, intact distal pulses and normal pulses.   Pulmonary/Chest: Breath sounds normal. No respiratory distress.  Abdominal: Soft. Bowel sounds are normal. There is no tenderness. There is no guarding.  Musculoskeletal: Normal range of motion.       Right ankle: He exhibits no deformity and normal pulse. Tenderness. Lateral malleolus tenderness found. Achilles tendon normal. Achilles tendon exhibits no defect.    Lymphadenopathy:       Head (right side): No submandibular adenopathy present.       Head (left side): No submandibular adenopathy present.    He has no cervical adenopathy.  Neurological: He is alert and oriented to person, place, and time. He has normal strength. No cranial nerve deficit or sensory deficit.  Skin: Skin is warm and dry.  Psychiatric: He has a normal mood and affect. His speech is normal.  Nursing note and vitals reviewed.    ED Treatments / Results  Labs (all labs ordered are listed, but only abnormal results are displayed) Labs Reviewed - No data to display  EKG  EKG Interpretation None       Radiology Dg Ankle Complete Right  Result Date: 02/14/2016 CLINICAL DATA:  Right ankle injury today. EXAM: RIGHT ANKLE - COMPLETE 3+ VIEW COMPARISON:  None.  FINDINGS: No fracture, subluxation or appreciable arthropathy. No focal osseous lesions. Small Achilles right calcaneal spur. No radiopaque foreign body. IMPRESSION: No fracture or malalignment. Electronically Signed   By: Ilona Sorrel M.D.   On: 02/14/2016 19:38    Procedures Procedures (including critical care time)  Medications Ordered in ED Medications  ibuprofen (ADVIL,MOTRIN) tablet 800 mg (not administered)  acetaminophen (TYLENOL) tablet 650 mg (not administered)     Initial Impression / Assessment and Plan / ED Course  I have reviewed the triage vital signs and the nursing notes.  Pertinent labs & imaging results that were available during my care of the patient were reviewed by me and considered in my medical decision making (see chart for details).  Clinical Course    **I have reviewed nursing notes, vital signs, and all appropriate lab and imaging results for this patient.*  Final Clinical Impressions(s) / ED Diagnoses  Vital signs reviewed. X-ray of the ankle is negative for fracture, or dislocation. The examination is negative for any neurovascular issues. Suspect the patient has a  strain/sprain involving the right ankle.  The patient will be treated with ankle stirrup splint. He is also given an ice pack. Crutches were offered, but the patient states he does not need them at this point. The patient is scheduling an appointment with podiatry specialist, and will have his ankle rechecked sometime next week. We discussed the need to have the ankle elevated above the waist, apply ice, and using the splint until seen by the podiatry specialist. The patient is in agreement with this discharge plan.    Final diagnoses:  None    New Prescriptions New Prescriptions   No medications on file     Lily Kocher, Hershal Coria 02/14/16 2119    Lajean Saver, MD 02/15/16 1650

## 2016-02-14 NOTE — Discharge Instructions (Signed)
Please apply ice to your ankle, and keep your ankle elevated above your waist. Please use the ankle stirrup splint until you're seen by your podiatry specialist. Please use ibuprofen every 6 hours for pain. May use Tylenol in between the ibuprofen doses if needed for soreness.

## 2016-02-14 NOTE — ED Triage Notes (Addendum)
I was chasing down a copperhead and twisted my right ankle and it is hurting.  Patient is able to bear weight and walk.

## 2016-03-13 ENCOUNTER — Encounter: Payer: Self-pay | Admitting: Family Medicine

## 2016-03-13 ENCOUNTER — Ambulatory Visit (INDEPENDENT_AMBULATORY_CARE_PROVIDER_SITE_OTHER): Payer: Managed Care, Other (non HMO) | Admitting: Family Medicine

## 2016-03-13 VITALS — BP 122/84 | Ht 66.0 in | Wt 225.6 lb

## 2016-03-13 DIAGNOSIS — I10 Essential (primary) hypertension: Secondary | ICD-10-CM | POA: Diagnosis not present

## 2016-03-13 DIAGNOSIS — G4733 Obstructive sleep apnea (adult) (pediatric): Secondary | ICD-10-CM

## 2016-03-13 DIAGNOSIS — G4700 Insomnia, unspecified: Secondary | ICD-10-CM | POA: Diagnosis not present

## 2016-03-13 DIAGNOSIS — Z79891 Long term (current) use of opiate analgesic: Secondary | ICD-10-CM

## 2016-03-13 DIAGNOSIS — M5412 Radiculopathy, cervical region: Secondary | ICD-10-CM | POA: Diagnosis not present

## 2016-03-13 MED ORDER — LISINOPRIL-HYDROCHLOROTHIAZIDE 10-12.5 MG PO TABS: 1.0000 | ORAL_TABLET | Freq: Every day | ORAL | 3 refills | Status: DC

## 2016-03-13 MED ORDER — HYDROCODONE-ACETAMINOPHEN 10-325 MG PO TABS: ORAL_TABLET | ORAL | 0 refills | Status: DC

## 2016-03-13 NOTE — Progress Notes (Signed)
Subjective:    Patient ID: Bryan Gilbert, male    DOB: 07/04/1966, 50 y.o.   MRN: [id number]  HPI This patient was seen today for chronic pain  The medication list was reviewed and updated.   -Compliance with medication: yes  - Number patient states they take daily:  6 tabs a day  -when was the last dose patient took? yes  The patient was advised the importance of maintaining medication and not using illegal substances with these.  Refills needed: yes  The patient was educated that we can provide 3 monthly scripts for their medication, it is their responsibility to follow the instructions.  Side effects or complications from medications: none  Patient is aware that pain medications are meant to minimize the severity of the pain to allow their pain levels to improve to allow for better function. They are aware of that pain medications cannot totally remove their pain.  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 He understands he should not use Valium as a bedtime medicine  He has insomnia he uses Ambien but only takes half of a tablet in the evening and does uses CPAP machine. Somehow his blood pressure medicine got messed up by the pharmacy but he states he's been taking the 10 mg lisinopril with 12.5 of HCTZ and is trying to watch his diet he is not able to exercise much because of his work schedule       Review of Systems  Constitutional: Negative for activity change.  Gastrointestinal:  Negative for abdominal pain and vomiting.  Neurological: Negative for weakness.  Psychiatric/Behavioral: Negative for confusion.       Objective:   Physical Exam  Constitutional: He appears well-nourished.  Cardiovascular: Normal rate, regular rhythm and normal heart sounds.   No murmur heard. Pulmonary/Chest: Effort normal and breath sounds normal.  Musculoskeletal: He exhibits no edema.  Lymphadenopathy:    He has no cervical adenopathy.  Neurological: He is alert.  Psychiatric: His behavior is normal.  Vitals reviewed.    25 minutes was spent with the patient. Greater than half the time was spent in discussion and answering questions and counseling regarding the issues that the patient came in for today.      Assessment & Plan:  1. Encounter for long-term opiate analgesic use The patient was seen today as part of a comprehensive visit regarding pain control. Patient's compliance with the medication as well as discussion regarding effectiveness was completed. Prescriptions were written. Patient was advised to follow-up in 3 months. The patient was assessed for any signs of severe side effects. The patient was advised to take the medicine as directed and to report to [place] if any side effect issues.   2. Essential hypertension HTN- Patient was seen today as part of a visit regarding hypertension. The importance of healthy diet and regular physical activity was discussed. The importance of compliance with medications discussed. Ideal goal is to keep blood pressure low elevated  levels certainly below Q000111Q when possible. The patient was counseled that keeping blood pressure under control lessen his risk of heart attack, stroke, kidney failure, and early death. The importance of regular follow-ups was discussed with the patient. Low-salt diet such as DASH recommended. Regular physical activity was recommended as well. Patient was advised to keep regular follow-ups. Prescription sent to  the pharmacy to verify the current dose with the pharmacy  3. Obstructive sleep apnea Continue using the CPAP machine he benefits greatly from it  4. Cervical neuralgia Pain medicine when necessary uses muscle relaxers when necessary is following up with specialist in the near future  5. Insomnia Ambien only when necessary at half the dose. Watch for any abnormal side effects.  Hyperlipidemia check lipid profile previous labs reviewed.

## 2016-04-26 ENCOUNTER — Other Ambulatory Visit: Payer: Self-pay | Admitting: Family Medicine

## 2016-04-27 NOTE — Telephone Encounter (Signed)
Refill times one 

## 2016-05-01 ENCOUNTER — Telehealth: Payer: Self-pay | Admitting: *Deleted

## 2016-05-01 ENCOUNTER — Telehealth: Payer: Self-pay | Admitting: Family Medicine

## 2016-05-01 DIAGNOSIS — R197 Diarrhea, unspecified: Secondary | ICD-10-CM

## 2016-05-01 NOTE — Telephone Encounter (Signed)
Pt called stating that her cat tested positive for round and tape worms and the vet is telling her that her whole family needs tested. The pt is wanting to know how to go about doing that. Please advise.

## 2016-05-16 ENCOUNTER — Other Ambulatory Visit: Payer: Self-pay | Admitting: Family Medicine

## 2016-06-05 ENCOUNTER — Other Ambulatory Visit: Payer: Self-pay | Admitting: Family Medicine

## 2016-06-07 NOTE — Telephone Encounter (Signed)
May refill the Ambien for this +5 additional refills made refill the diazepam this plus one additional refill

## 2016-06-11 ENCOUNTER — Encounter: Payer: Self-pay | Admitting: Family Medicine

## 2016-06-11 ENCOUNTER — Ambulatory Visit (INDEPENDENT_AMBULATORY_CARE_PROVIDER_SITE_OTHER): Payer: Managed Care, Other (non HMO) | Admitting: Family Medicine

## 2016-06-11 VITALS — BP 118/82 | Ht 66.0 in | Wt 237.0 lb

## 2016-06-11 DIAGNOSIS — Z23 Encounter for immunization: Secondary | ICD-10-CM | POA: Diagnosis not present

## 2016-06-11 DIAGNOSIS — Z79899 Other long term (current) drug therapy: Secondary | ICD-10-CM | POA: Diagnosis not present

## 2016-06-11 DIAGNOSIS — G8929 Other chronic pain: Secondary | ICD-10-CM

## 2016-06-11 DIAGNOSIS — G4733 Obstructive sleep apnea (adult) (pediatric): Secondary | ICD-10-CM | POA: Diagnosis not present

## 2016-06-11 DIAGNOSIS — G43A0 Cyclical vomiting, not intractable: Secondary | ICD-10-CM | POA: Diagnosis not present

## 2016-06-11 DIAGNOSIS — G4709 Other insomnia: Secondary | ICD-10-CM | POA: Diagnosis not present

## 2016-06-11 DIAGNOSIS — M5412 Radiculopathy, cervical region: Secondary | ICD-10-CM | POA: Diagnosis not present

## 2016-06-11 DIAGNOSIS — R5383 Other fatigue: Secondary | ICD-10-CM | POA: Diagnosis not present

## 2016-06-11 DIAGNOSIS — I10 Essential (primary) hypertension: Secondary | ICD-10-CM | POA: Diagnosis not present

## 2016-06-11 DIAGNOSIS — Z125 Encounter for screening for malignant neoplasm of prostate: Secondary | ICD-10-CM

## 2016-06-11 DIAGNOSIS — R1115 Cyclical vomiting syndrome unrelated to migraine: Secondary | ICD-10-CM

## 2016-06-11 MED ORDER — HYDROCODONE-ACETAMINOPHEN 10-325 MG PO TABS: ORAL_TABLET | ORAL | 0 refills | Status: DC

## 2016-06-11 NOTE — Progress Notes (Signed)
   Subjective:    Patient ID: Bryan Gilbert, male    DOB: [birth year], 50 y.o.   MRN: [id number]  HPI This patient was seen today for chronic pain  The medication list was reviewed and updated.   -Compliance with medication: yes  - Number patient states they take daily: 6 daily   -when was the last dose patient took: today   The patient was advised the importance of maintaining medication and not using illegal substances with these.  Refills needed: yes  The patient was educated that we can provide 3 monthly scripts for their medication, it is their responsibility to follow the instructions.  Side effects or complications from medications: none  Patient is aware that pain medications are meant to minimize the severity of the pain to allow their pain levels to improve to allow for better function. They are aware of that pain medications cannot totally remove their pain.  Due for UDT ( at least once per year) : utd  Patient had a fall on Saturday and bruised his right arm. He is doing better.  Patient states that he tripped fell hit his arm causes bruising and discomfort doing better now He uses his CPAP machine most nights states it does help States is cyclical vomiting under good control rarely has to use Stadol spray His neck is given a more trouble uses a pain medicine responsibly Is going to go get an injection Taken blood pressure medicine as directed Takes Ambien at nighttime to help with sleep Does not take a pain pill with this Drug registry was checked Patient due for lab work he has been inconsistent about getting lab work done He does relate a fair amount of fatigue      Review of Systems Denies headache does relate fatigue no fever chills no cough or wheezing no vomiting or diarrhea does relate neck pain and low back pain    Objective:   Physical Exam Lungs clear heart regular subjected neck pain extremities no edema skin warm dry blood pressure  good   Complex visit    Assessment & Plan:  Blood pressure-good control watch diet continue current measures  Sleep apnea uses it is much is possible patient sometimes doesn't tolerate  Cyclical vomiting no flareups recently followed by specialist  Cervical pain with neuralgia pain medication prescribed caution drowsiness patient states he does not overuse it 3 prescriptions given  Insomnia uses medication helps to some degree he typically works late at night goes to bed around 3 AM gets up around 9 PM a.m.  Fatigue tiredness probably related to low testosterone also obesity and low activity  Screening lab work ordered including lipid PSA  Follow-up 3 months

## 2016-06-18 ENCOUNTER — Other Ambulatory Visit: Payer: Self-pay | Admitting: Family Medicine

## 2016-06-18 NOTE — Telephone Encounter (Signed)
May have this and one refill

## 2016-06-18 NOTE — Telephone Encounter (Signed)
Pt on pain contract here.  Original RX did not come from you. Would you like to fill?

## 2016-06-23 ENCOUNTER — Other Ambulatory Visit: Payer: Self-pay | Admitting: *Deleted

## 2016-06-23 MED ORDER — LISINOPRIL-HYDROCHLOROTHIAZIDE 20-12.5 MG PO TABS: 1.0000 | ORAL_TABLET | Freq: Every day | ORAL | 0 refills | Status: DC

## 2016-07-09 ENCOUNTER — Other Ambulatory Visit: Payer: Self-pay | Admitting: *Deleted

## 2016-07-09 MED ORDER — LISINOPRIL-HYDROCHLOROTHIAZIDE 10-12.5 MG PO TABS: 1.0000 | ORAL_TABLET | Freq: Every day | ORAL | 0 refills | Status: DC

## 2016-09-08 ENCOUNTER — Ambulatory Visit (INDEPENDENT_AMBULATORY_CARE_PROVIDER_SITE_OTHER): Payer: Managed Care, Other (non HMO) | Admitting: Family Medicine

## 2016-09-08 ENCOUNTER — Encounter: Payer: Self-pay | Admitting: Family Medicine

## 2016-09-08 VITALS — BP 118/80 | Ht 66.0 in | Wt 244.5 lb

## 2016-09-08 DIAGNOSIS — G43A0 Cyclical vomiting, not intractable: Secondary | ICD-10-CM

## 2016-09-08 DIAGNOSIS — M5412 Radiculopathy, cervical region: Secondary | ICD-10-CM

## 2016-09-08 DIAGNOSIS — R109 Unspecified abdominal pain: Secondary | ICD-10-CM | POA: Diagnosis not present

## 2016-09-08 DIAGNOSIS — R1115 Cyclical vomiting syndrome unrelated to migraine: Secondary | ICD-10-CM

## 2016-09-08 DIAGNOSIS — G8929 Other chronic pain: Secondary | ICD-10-CM

## 2016-09-08 DIAGNOSIS — I10 Essential (primary) hypertension: Secondary | ICD-10-CM

## 2016-09-08 DIAGNOSIS — G4709 Other insomnia: Secondary | ICD-10-CM

## 2016-09-08 MED ORDER — CYCLOBENZAPRINE HCL 10 MG PO TABS: 10.0000 mg | ORAL_TABLET | Freq: Three times a day (TID) | ORAL | 2 refills | Status: DC | PRN

## 2016-09-08 MED ORDER — MELOXICAM 15 MG PO TABS: 15.0000 mg | ORAL_TABLET | Freq: Every day | ORAL | 8 refills | Status: DC | PRN

## 2016-09-08 MED ORDER — HYDROCODONE-ACETAMINOPHEN 10-325 MG PO TABS: ORAL_TABLET | ORAL | 0 refills | Status: DC

## 2016-09-08 MED ORDER — LISINOPRIL-HYDROCHLOROTHIAZIDE 10-12.5 MG PO TABS: 1.0000 | ORAL_TABLET | Freq: Every day | ORAL | 2 refills | Status: DC

## 2016-09-08 MED ORDER — SERTRALINE HCL 100 MG PO TABS: ORAL_TABLET | ORAL | 2 refills | Status: DC

## 2016-09-08 MED ORDER — DIAZEPAM 5 MG PO TABS: 5.0000 mg | ORAL_TABLET | Freq: Four times a day (QID) | ORAL | 1 refills | Status: DC | PRN

## 2016-09-08 NOTE — Patient Instructions (Signed)

## 2016-09-08 NOTE — Progress Notes (Signed)
   Subjective:    Patient ID: Bryan Gilbert, male    DOB: 1966/02/26, 51 y.o.   MRN: [id number]  HPI This patient was seen today for chronic pain  The medication list was reviewed and updated.   -Compliance with medication: yes  - Number patient states they take daily: 5 - 6 daily   -when was the last dose patient took: 1 -2 hours ago   The patient was advised the importance of maintaining medication and not using illegal substances with these.  Refills needed: yes  The patient was educated that we can provide 3 monthly scripts for their medication, it is their responsibility to follow the instructions.  Side effects or complications from medications: none  Patient is aware that pain medications are meant to minimize the severity of the pain to allow their pain levels to improve to allow for better function. They are aware of that pain medications cannot totally remove their pain.  Due for UDT ( at least once per year) : utd  Patient states that he has no other concerns at this time.  This patient has cyclical vomiting syndrome. He occasionally has to use Stadol to help keep it under control. This is described by specialist at [facility].  He has chronic neck pain and discomfort gets injections which seemed to help but he has take the pain medicine every 4 hours he denies pain medicine causing drowsiness states it helps him function  He does take his blood pressure medicine on a regular basis and tries to minimize salt use  He uses muscle relaxers intermittently for back pain and neck spasms he is aware that that can cause drowsiness he only uses it when he is at home and not with driving  He does suffer with chronic insomnia and takes medication help him with sleep and states that this does help he denies abusing it   He does relate that his reflux under good control with his PPI takes in on a regular basis  Patient states moods are under good control he is using Zoloft  one half tablet daily denies any active depression but he states medicine necessary to keep his moods under good control   Review of Systems Denies any chest tightness pressure pain shortness breath nausea vomiting diarrhea does relate neck pain discomfort denies dysuria hematuria hematochezia denies fever chills sweats    Objective:   Physical Exam Eardrums normal neck no masses lungs are clear no crackles heart is regular no murmurs abdomen soft mildly obese extremities no edema skin warm dry subjective discomfort in the neck and the low back       Assessment & Plan:  Blood pressure good control continue current measures watch diet stay active try to lose weight  Patient is concerned about parasites we will check this he is not having any true abdominal discomfort  Cyclical vomiting syndrome follow with specialists Stadol sparingly  Chronic neck pain injections as directed as well as pain medicine caution drowsiness 3 scripts were given database was checked patient assures that he is not abusing the medicine he is going to do urine drug screen later this year and does a yearly pain management agreement  Insomnia patient may continue the Ambien  Depression under good control continue Zoloft

## 2016-09-09 ENCOUNTER — Encounter: Payer: Self-pay | Admitting: Family Medicine

## 2016-09-09 LAB — LIPID PANEL
Chol/HDL Ratio: 5.7 ratio units — ABNORMAL HIGH (ref 0.0–5.0)
Cholesterol, Total: 218 mg/dL — ABNORMAL HIGH (ref 100–199)
HDL: 38 mg/dL — ABNORMAL LOW (ref >39)
LDL Calculated: 132 mg/dL — ABNORMAL HIGH (ref 0–99)
Triglycerides: 242 mg/dL — ABNORMAL HIGH (ref 0–149)
VLDL Cholesterol Cal: 48 mg/dL — ABNORMAL HIGH (ref 5–40)

## 2016-09-09 LAB — HEPATIC FUNCTION PANEL
ALT: 46 IU/L — ABNORMAL HIGH (ref 0–44)
AST: 26 IU/L (ref 0–40)
Albumin: 4.5 g/dL (ref 3.5–5.5)
Alkaline Phosphatase: 104 IU/L (ref 39–117)
Bilirubin Total: 0.4 mg/dL (ref 0.0–1.2)
Bilirubin, Direct: 0.12 mg/dL (ref 0.00–0.40)
Total Protein: 7.5 g/dL (ref 6.0–8.5)

## 2016-09-09 LAB — TSH: TSH: 2.22 u[IU]/mL (ref 0.450–4.500)

## 2016-09-09 LAB — BASIC METABOLIC PANEL
BUN/Creatinine Ratio: 19 (ref 9–20)
BUN: 15 mg/dL (ref 6–24)
CO2: 26 mmol/L (ref 18–29)
Calcium: 9.6 mg/dL (ref 8.7–10.2)
Chloride: 101 mmol/L (ref 96–106)
Creatinine, Ser: 0.81 mg/dL (ref 0.76–1.27)
GFR calc Af Amer: 120 mL/min/{1.73_m2} (ref >59)
GFR calc non Af Amer: 104 mL/min/{1.73_m2} (ref >59)
Glucose: 96 mg/dL (ref 65–99)
Potassium: 4.5 mmol/L (ref 3.5–5.2)
Sodium: 143 mmol/L (ref 134–144)

## 2016-09-09 LAB — PSA: Prostate Specific Ag, Serum: 0.3 ng/mL (ref 0.0–4.0)

## 2016-09-09 LAB — VITAMIN B12: Vitamin B-12: 1100 pg/mL (ref 232–1245)

## 2016-10-30 ENCOUNTER — Ambulatory Visit (INDEPENDENT_AMBULATORY_CARE_PROVIDER_SITE_OTHER): Payer: Managed Care, Other (non HMO) | Admitting: Family Medicine

## 2016-10-30 ENCOUNTER — Encounter: Payer: Self-pay | Admitting: Family Medicine

## 2016-10-30 VITALS — BP 132/80 | Temp 100.5°F | Ht 66.0 in | Wt 239.0 lb

## 2016-10-30 DIAGNOSIS — J111 Influenza due to unidentified influenza virus with other respiratory manifestations: Secondary | ICD-10-CM | POA: Diagnosis not present

## 2016-10-30 DIAGNOSIS — J039 Acute tonsillitis, unspecified: Secondary | ICD-10-CM

## 2016-10-30 MED ORDER — AMOXICILLIN 500 MG PO TABS: 500.0000 mg | ORAL_TABLET | Freq: Three times a day (TID) | ORAL | 0 refills | Status: DC

## 2016-10-30 MED ORDER — OSELTAMIVIR PHOSPHATE 75 MG PO CAPS: 75.0000 mg | ORAL_CAPSULE | Freq: Two times a day (BID) | ORAL | 0 refills | Status: DC

## 2016-10-30 NOTE — Patient Instructions (Signed)

## 2016-10-30 NOTE — Progress Notes (Signed)
   Subjective:    Patient ID: Bryan Gilbert, male    DOB: Jan 04, 1966, 51 y.o.   MRN: [id number]  Sinusitis  This is a new problem. The current episode started yesterday. Associated symptoms include congestion, headaches, sinus pressure and a sore throat. (Fever, joint pain)   Patient relates a past couple days body aches aches discomfort doesn't feel good denies vomiting diarrhea. States energy level low. High fever sinus pressure drainage also had sore throat.   Review of Systems  HENT: Positive for congestion, sinus pressure and sore throat.   Neurological: Positive for headaches.       Objective:   Physical Exam  Throat erythematous strong gag reflex neck no adenopathy lungs clear no crackles heart regular pulse normal ears normal. No respiratory distress. Patient refuses rapid strep test     Assessment & Plan:  Viral syndrome Probable influenza Possible strep-patient will not tolerate rapid strep test Will cover for strep with amoxicillin Tamiflu for flu Warning signs discussed in detail Influenza-the patient was diagnosed with influenza. Patient/family educated about the flu and warning signs to watch for. If difficulty breathing, severe neck pain and stiffness, cyanosis, disorientation, or progressive worsening then immediately get rechecked at that ER. If progressive symptoms be certain to be rechecked. Supportive measures such as Tylenol/ibuprofen was discussed. No aspirin use in children. And influenza home care instruction sheet was given. If worsens over the next few days then will need potential lab work and x-rays

## 2016-11-28 ENCOUNTER — Other Ambulatory Visit: Payer: Self-pay | Admitting: Family Medicine

## 2016-12-03 ENCOUNTER — Encounter: Payer: Self-pay | Admitting: Family Medicine

## 2016-12-03 ENCOUNTER — Ambulatory Visit (INDEPENDENT_AMBULATORY_CARE_PROVIDER_SITE_OTHER): Payer: Managed Care, Other (non HMO) | Admitting: Family Medicine

## 2016-12-03 VITALS — BP 118/80 | Ht 66.0 in | Wt 243.2 lb

## 2016-12-03 DIAGNOSIS — L821 Other seborrheic keratosis: Secondary | ICD-10-CM

## 2016-12-03 DIAGNOSIS — G4709 Other insomnia: Secondary | ICD-10-CM | POA: Diagnosis not present

## 2016-12-03 DIAGNOSIS — I10 Essential (primary) hypertension: Secondary | ICD-10-CM

## 2016-12-03 DIAGNOSIS — M7711 Lateral epicondylitis, right elbow: Secondary | ICD-10-CM

## 2016-12-03 DIAGNOSIS — M5412 Radiculopathy, cervical region: Secondary | ICD-10-CM

## 2016-12-03 MED ORDER — DIAZEPAM 5 MG PO TABS: 5.0000 mg | ORAL_TABLET | Freq: Four times a day (QID) | ORAL | 3 refills | Status: DC | PRN

## 2016-12-03 MED ORDER — ZOLPIDEM TARTRATE 10 MG PO TABS: 10.0000 mg | ORAL_TABLET | Freq: Every day | ORAL | 5 refills | Status: DC

## 2016-12-03 MED ORDER — HYDROCODONE-ACETAMINOPHEN 10-325 MG PO TABS: ORAL_TABLET | ORAL | 0 refills | Status: DC

## 2016-12-03 NOTE — Progress Notes (Signed)
Subjective:    Patient ID: Bryan Gilbert, male    DOB: 03-14-1966, 51 y.o.   MRN: [id number]  HPI This patient was seen today for chronic pain  The medication list was reviewed and updated.   -Compliance with medication: Takes daily   - Number patient states they take daily: States takes up to 6 per day.  -when was the last dose patient took? Last dose taken at 0900  The patient was advised the importance of maintaining medication and not using illegal substances with these.  Refills needed: yes  The patient was educated that we can provide 3 monthly scripts for their medication, it is their responsibility to follow the instructions.  Side effects or complications from medications: None  Patient is aware that pain medications are meant to minimize the severity of the pain to allow their pain levels to improve to allow for better function. They are aware of that pain medications cannot totally remove their pain.  Due for UDT ( at least once per year) : 12/11/16  Has concerns of possible tendonitis to right arm, and ankle pain-Having intermittent ankle pain does stretches Has intermittent pain in the elbow tenderness did not do anything that triggered it Patient does take Zoloft for his moods states it's overall doing well. He is thinking about reducing it down to 100 mg he feels he is doing well He does uses Ambien for insomnia without it he does not sleep he states he avoids taking the pain medicine at bedtime He uses muscle relaxer very rarely in order to help him with neck spasms Patient does take his blood pressure medicine on a regular basis he tries the healthy although he knows he needs to lose weight exercise   Review of Systems  Constitutional: Negative for activity change, appetite change and fever.  HENT: Negative for congestion and rhinorrhea.   Eyes: Negative for discharge.  Respiratory: Negative for cough and wheezing.   Cardiovascular: Negative for chest pain.    Gastrointestinal: Negative for abdominal pain, blood in stool and vomiting.  Musculoskeletal: Negative for neck pain.  Skin: Negative for rash.       Objective:   Physical Exam  Constitutional: He appears well-developed and well-nourished.  HENT:  Head: Normocephalic and atraumatic.  Right Ear: External ear normal.  Left Ear: External ear normal.  Nose: Nose normal.  Mouth/Throat: Oropharynx is clear and moist.  Eyes: EOM are normal. Pupils are equal, round, and reactive to light.  Neck: Normal range of motion. Neck supple.  Cardiovascular: Normal rate, regular rhythm and normal heart sounds.   No murmur heard. Pulmonary/Chest: Effort normal and breath sounds normal. No respiratory distress. He has no wheezes.  Abdominal: Soft. Bowel sounds are normal. He exhibits no distension and no mass. There is no tenderness.  Genitourinary: Penis normal.  Musculoskeletal: Normal range of motion. He exhibits no edema.  Lymphadenopathy:    He has no cervical adenopathy.  Neurological: He is alert.  Skin: Skin is warm and dry.  Psychiatric: He has a normal mood and affect. His behavior is normal. Judgment normal.          Assessment & Plan:  The patient has been counseled that taking sleeping medicine and pain medicine together at bedtime is dangerous He's also been counseled to use Valium very rarely his specialists has given him this and recommended it for his cervical spasms The patient was counseled that combination of sleep medicine nerve medicine and pain medicine dramatically increases  her risk of accidental death Blood pressure good overall continue current measures Moderate obesity watch diet lose weight Pain medication does help him function he has severe back and neck pain they've tried multiple injections unfortunately there is not much they can do for him pain management is about all he can do 3 prescriptions given follow-up in 3 months  Anti-inflammatory he is RD on this  for the purpose of arthralgias this should help his elbow if ongoing troubles injection with steroid Moods overall doing well continue Zoloft as this

## 2016-12-09 ENCOUNTER — Encounter: Payer: Self-pay | Admitting: Family Medicine

## 2017-01-07 ENCOUNTER — Other Ambulatory Visit: Payer: Self-pay | Admitting: Family Medicine

## 2017-02-24 ENCOUNTER — Ambulatory Visit: Payer: Managed Care, Other (non HMO) | Admitting: Family Medicine

## 2017-02-25 ENCOUNTER — Encounter: Payer: Self-pay | Admitting: Family Medicine

## 2017-02-25 ENCOUNTER — Ambulatory Visit (INDEPENDENT_AMBULATORY_CARE_PROVIDER_SITE_OTHER): Payer: Managed Care, Other (non HMO) | Admitting: Family Medicine

## 2017-02-25 VITALS — BP 128/88 | Ht 66.0 in | Wt 248.0 lb

## 2017-02-25 DIAGNOSIS — Z79891 Long term (current) use of opiate analgesic: Secondary | ICD-10-CM

## 2017-02-25 MED ORDER — DICLOFENAC SODIUM 75 MG PO TBEC: 75.0000 mg | DELAYED_RELEASE_TABLET | Freq: Two times a day (BID) | ORAL | 5 refills | Status: DC

## 2017-02-25 MED ORDER — HYDROCODONE-ACETAMINOPHEN 10-325 MG PO TABS: ORAL_TABLET | ORAL | 0 refills | Status: DC

## 2017-02-25 MED ORDER — CYCLOBENZAPRINE HCL 10 MG PO TABS: 10.0000 mg | ORAL_TABLET | Freq: Three times a day (TID) | ORAL | 5 refills | Status: DC | PRN

## 2017-02-25 MED ORDER — TRAZODONE HCL 50 MG PO TABS: 25.0000 mg | ORAL_TABLET | Freq: Every evening | ORAL | 3 refills | Status: DC | PRN

## 2017-02-25 NOTE — Progress Notes (Signed)
   Subjective:    Patient ID: Bryan Gilbert, male    DOB: [birth year], 51 y.o.   MRN: [id number]  HPI    Review of Systems     Objective:   Physical Exam        Assessment & Plan:

## 2017-02-25 NOTE — Progress Notes (Signed)
   Subjective:    Patient ID: Bryan Gilbert, male    DOB: [birth year], 51 y.o.   MRN: [id number]  HPI This patient was seen today for chronic pain  The medication list was reviewed and updated.   -Compliance with medication: Yes  - Number patient states they take daily: 5-7  -when was the last dose patient took? Today around 12 noon.  The patient was advised the importance of maintaining medication and not using illegal substances with these.  Refills needed: Yes  The patient was educated that we can provide 3 monthly scripts for their medication, it is their responsibility to follow the instructions.  Side effects or complications from medications: No  Patient is aware that pain medications are meant to minimize the severity of the pain to allow their pain levels to improve to allow for better function. They are aware of that pain medications cannot totally remove their pain.  Due for UDT ( at least once per year) : Today      Review of Systems  Constitutional: Negative for activity change.  Gastrointestinal: Negative for abdominal pain and vomiting.  Neurological: Negative for weakness.  Psychiatric/Behavioral: Negative for confusion.       Objective:   Physical Exam  Constitutional: He appears well-nourished.  Cardiovascular: Normal rate, regular rhythm and normal heart sounds.   No murmur heard. Pulmonary/Chest: Effort normal and breath sounds normal.  Musculoskeletal: He exhibits no edema.  Lymphadenopathy:    He has no cervical adenopathy.  Neurological: He is alert.  Psychiatric: His behavior is normal.  Vitals reviewed.         Assessment & Plan:  Chronic pain medicine discussed Drug registry check Importance of safety with pain medicine discussed 3 prescriptions on pain medicine given Urine drug screen today  Insomnia patient was told it is no longer safety use Ambien at nighttime. Discontinue Ambien. Go with trazodone 50 mg daily at bedtime if  that doesn't do enough to let us know  Arthralgias discontinue meloxicam try diclofenac 75 mg twice a day if that bothers his stomach he is to let us know  Follow-up in 3 months

## 2017-03-03 LAB — TOXASSURE SELECT 13 (MW), URINE

## 2017-03-05 ENCOUNTER — Ambulatory Visit: Payer: Managed Care, Other (non HMO) | Admitting: Family Medicine

## 2017-05-04 ENCOUNTER — Ambulatory Visit (INDEPENDENT_AMBULATORY_CARE_PROVIDER_SITE_OTHER): Payer: Managed Care, Other (non HMO) | Admitting: Family Medicine

## 2017-05-04 ENCOUNTER — Encounter: Payer: Self-pay | Admitting: Family Medicine

## 2017-05-04 VITALS — BP 130/78 | Temp 98.9°F | Ht 66.0 in | Wt 240.2 lb

## 2017-05-04 DIAGNOSIS — J019 Acute sinusitis, unspecified: Secondary | ICD-10-CM | POA: Diagnosis not present

## 2017-05-04 MED ORDER — FLUTICASONE PROPIONATE 50 MCG/ACT NA SUSP: 2.0000 | Freq: Every day | NASAL | 5 refills | Status: DC

## 2017-05-04 MED ORDER — LEVOFLOXACIN 500 MG PO TABS: 500.0000 mg | ORAL_TABLET | Freq: Every day | ORAL | 0 refills | Status: DC

## 2017-05-04 NOTE — Progress Notes (Signed)
   Subjective:    Patient ID: Bryan Gilbert, male    DOB: 1965-09-12, 51 y.o.   MRN: [id number]  Sinus Problem  This is a new problem. The current episode started 1 to 4 weeks ago. Associated symptoms include congestion, coughing, headaches and sinus pressure. Pertinent negatives include no ear pain. Treatments tried: sinus rinse, sudafed.   Viral syndrome Secondary redness on a lot of head congestion sinus pressure pain discomfort this been going on for the past few weeks.  No wheezing or difficulty breathing.  No high fever chills or sweats.  Does not know if he has had any allergy symptoms.   Review of Systems  Constitutional: Negative for activity change and fever.  HENT: Positive for congestion, rhinorrhea and sinus pressure. Negative for ear pain.   Eyes: Negative for discharge.  Respiratory: Positive for cough. Negative for wheezing.   Cardiovascular: Negative for chest pain.  Neurological: Positive for headaches.       Objective:   Physical Exam  Constitutional: He appears well-developed.  HENT:  Head: Normocephalic.  Mouth/Throat: Oropharynx is clear and moist. No oropharyngeal exudate.  Neck: Normal range of motion.  Cardiovascular: Normal rate, regular rhythm and normal heart sounds.   No murmur heard. Pulmonary/Chest: Effort normal and breath sounds normal. He has no wheezes.  Lymphadenopathy:    He has no cervical adenopathy.  Neurological: He exhibits normal muscle tone.  Skin: Skin is warm and dry.  Nursing note and vitals reviewed.   Nostrils are clogged up eardrums appear normal lungs clear no crackles      Assessment & Plan:  Viral syndrome Secondary rhinosinusitis prescribed warning signs discussed follow-up if problems Flonase on a regular basis  Patient states he will be trying CBD oil to help him sleep and he is going to try to get away from Ambien which I think is a good plan for this patient he is also going to try to cut down on the amount of  narcotics he is using which I also think is a good plan

## 2017-05-12 ENCOUNTER — Telehealth: Payer: Self-pay | Admitting: Family Medicine

## 2017-05-12 NOTE — Telephone Encounter (Signed)
Please review letter & complete form to be sent back  (multiple controlled substance medications)  Forward to [place] to be faxed back to [facility]  Letter & form in red folder in yellow box

## 2017-05-14 ENCOUNTER — Other Ambulatory Visit: Payer: Self-pay | Admitting: Family Medicine

## 2017-05-14 NOTE — Telephone Encounter (Signed)
Last seen 02/25/17

## 2017-05-21 NOTE — Telephone Encounter (Signed)
[facility] and [facility].

## 2017-05-21 NOTE — Telephone Encounter (Signed)
Form was completed as requested, please notify [facility] by phone that we have discontinued his Ambien back in September but it is apparent that he had some refills that he kept getting please make sure that they take this medication off his med list at the pharmacy

## 2017-05-25 ENCOUNTER — Other Ambulatory Visit: Payer: Self-pay | Admitting: Family Medicine

## 2017-05-26 NOTE — Telephone Encounter (Signed)
May have each +6 additional refills

## 2017-05-28 ENCOUNTER — Encounter: Payer: Self-pay | Admitting: Family Medicine

## 2017-05-28 ENCOUNTER — Ambulatory Visit (INDEPENDENT_AMBULATORY_CARE_PROVIDER_SITE_OTHER): Payer: Managed Care, Other (non HMO) | Admitting: Family Medicine

## 2017-05-28 VITALS — BP 114/72 | Ht 66.0 in | Wt 255.4 lb

## 2017-05-28 DIAGNOSIS — Z79891 Long term (current) use of opiate analgesic: Secondary | ICD-10-CM

## 2017-05-28 DIAGNOSIS — M5412 Radiculopathy, cervical region: Secondary | ICD-10-CM | POA: Diagnosis not present

## 2017-05-28 MED ORDER — MELOXICAM 15 MG PO TABS: 15.0000 mg | ORAL_TABLET | Freq: Every day | ORAL | 5 refills | Status: DC

## 2017-05-28 MED ORDER — QUETIAPINE FUMARATE 50 MG PO TABS: 50.0000 mg | ORAL_TABLET | Freq: Every day | ORAL | 5 refills | Status: DC

## 2017-05-28 MED ORDER — DIAZEPAM 5 MG PO TABS: 5.0000 mg | ORAL_TABLET | Freq: Four times a day (QID) | ORAL | 1 refills | Status: DC | PRN

## 2017-05-28 MED ORDER — HYDROCODONE-ACETAMINOPHEN 10-325 MG PO TABS: ORAL_TABLET | ORAL | 0 refills | Status: DC

## 2017-05-28 NOTE — Progress Notes (Signed)
Subjective:     Patient ID: Bryan Gilbert, male   DOB: 1965-07-31, 51 y.o.   MRN: [id number]  HPI This patient was seen today for chronic pain  The medication list was reviewed and updated.   -Compliance with medication: Takes daily   - Number patient states they take daily: Takes 6 per day  -when was the last dose patient took?  Last dose 1100  The patient was advised the importance of maintaining medication and not using illegal substances with these.  Refills needed: none   The patient was educated that we can provide 3 monthly scripts for their medication, it is their responsibility to follow the instructions.  Side effects or complications from medications: None Patient is aware that pain medications are meant to minimize the severity of the pain to allow their pain levels to improve to allow for better function. They are aware of that pain medications cannot totally remove their pain.  Due for UDT ( at least once per year) : Next due 02/25/2018    Patient states no other concerns this visit.   Review of Systems  Constitutional: Negative for activity change.  Gastrointestinal: Negative for abdominal pain and vomiting.  Neurological: Negative for weakness.  Psychiatric/Behavioral: Negative for confusion.  I reviewed over the patient's pain prescription protocol Of also reviewed over his pain prescription filling The patient states he has been getting his Stadol filled and storing it in the refrigerator because sometimes his cyclical vomiting causes such problems that requires multiple doses of Stadol he states it does not cause drowsiness with him.  He states he has not had to use it for several months.  The patient also states that he does not use his Valium often He relates he takes his Ambien but not with his hydrocodone     Objective:   Physical Exam  Constitutional: He appears well-nourished.  Cardiovascular: Normal rate, regular rhythm and normal heart sounds.  No  murmur heard. Pulmonary/Chest: Effort normal and breath sounds normal.  Musculoskeletal: He exhibits no edema.  Lymphadenopathy:    He has no cervical adenopathy.  Neurological: He is alert.  Psychiatric: His behavior is normal.  Vitals reviewed.      Assessment:     Chronic pain Hypertension Hyperlipidemia Insomnia Stress/anxiety    Plan:     Long discussion held with the patient regarding safety and medications.  I believe the patient is responsibly using his hydrocodone between 5 or 6/day for chronic neck pain  I have instructed the patient that he can no longer taking Ambien for sleep because of the risk of accidental overdose  He tried trazodone it did not help much I recommend Seroquel 50 mg nightly, certainly counseling/cognitive behavioral therapy would help if the patient was interested currently he is not  Neck muscle spasms-sees specialist that does injections typically uses Valium on a as needed basis when this bothers him he was instructed not to use it frequently and instructed not to take it with the hydrocodone because of risk of accidental overdose  Patient was told that if his pain progresses referral to a pain management center would be reasonable patient does not want to do this at this time  He may use the stadol his specialist prescribed for cyclical vomiting syndrome but he was explicitly told that he should not take that with hydrocodone or Valium

## 2017-05-31 ENCOUNTER — Encounter: Payer: Self-pay | Admitting: Family Medicine

## 2017-06-01 ENCOUNTER — Other Ambulatory Visit: Payer: Self-pay | Admitting: *Deleted

## 2017-06-01 ENCOUNTER — Telehealth: Payer: Self-pay | Admitting: Family Medicine

## 2017-06-01 DIAGNOSIS — M542 Cervicalgia: Secondary | ICD-10-CM

## 2017-06-01 NOTE — Telephone Encounter (Signed)
Order for referral to pain management in [place] put in. zoloft was already removed from med list. [facility].

## 2017-06-01 NOTE — Telephone Encounter (Signed)
Per patient request he would like to go ahead and be set up for chronic pain management with a pain management specialist in [facility] diagnosis cervical pain/cervical nerve impingement.  Also please remove Zoloft from his medication list and [facility] that it was [facility] thank you

## 2017-06-07 ENCOUNTER — Encounter: Payer: Self-pay | Admitting: Family Medicine

## 2017-06-24 ENCOUNTER — Other Ambulatory Visit: Payer: Self-pay | Admitting: Family Medicine

## 2017-06-25 ENCOUNTER — Other Ambulatory Visit: Payer: Self-pay | Admitting: Family Medicine

## 2017-06-25 ENCOUNTER — Encounter: Payer: Self-pay | Admitting: Family Medicine

## 2017-06-25 MED ORDER — QUETIAPINE FUMARATE 100 MG PO TABS: 100.0000 mg | ORAL_TABLET | Freq: Every day | ORAL | 5 refills | Status: DC

## 2017-06-27 ENCOUNTER — Other Ambulatory Visit: Payer: Self-pay | Admitting: Family Medicine

## 2017-07-09 ENCOUNTER — Telehealth: Payer: Self-pay | Admitting: Family Medicine

## 2017-07-09 NOTE — Telephone Encounter (Signed)
Review lab results in results folder from [facility].

## 2017-07-11 NOTE — Telephone Encounter (Signed)
Please call patient and the following by January 11 -lab work was sent to [place] by his pain doctor-showed elevation of liver enzymes this will need further looking into-more than likely fatty liver- by medical protocol I recommend the following- ultrasound right upper quadrant, ferritin, TIBC, hepatitis C antibody, hepatitis B surface antigen, ANA, ceruloplasmin level-elevated transaminase is the diagnosis

## 2017-07-14 NOTE — Telephone Encounter (Signed)
I called and left a message to r/c. 

## 2017-08-03 NOTE — Telephone Encounter (Signed)
Left message to return call 

## 2017-08-09 NOTE — Telephone Encounter (Signed)
I mailed a green card asked that he r/c to the office.

## 2017-08-13 IMAGING — CT CT ABD-PELV W/ CM
2 of 5 series · 16 of 46 positions shown, 18 images · IV contrast (Omnipaque 300)
Comparison: April 17, 2009

CLINICAL DATA: Nausea, vomiting and left upper quadrant pain.

EXAM:
CT ABDOMEN AND PELVIS WITH CONTRAST
TECHNIQUE: Multidetector CT imaging of the abdomen and pelvis was performed
using the standard protocol following bolus administration of
intravenous contrast.
CONTRAST:  100mL OMNIPAQUE IOHEXOL 300 MG/ML  SOLN

[Series 2: abd_pel_with 5.0 b40f · axial · 0.77mm/px · z∈[+546,+996]mm · 13 of 103 slices shown, 15 images]
[im 7/103  soft-tissue]
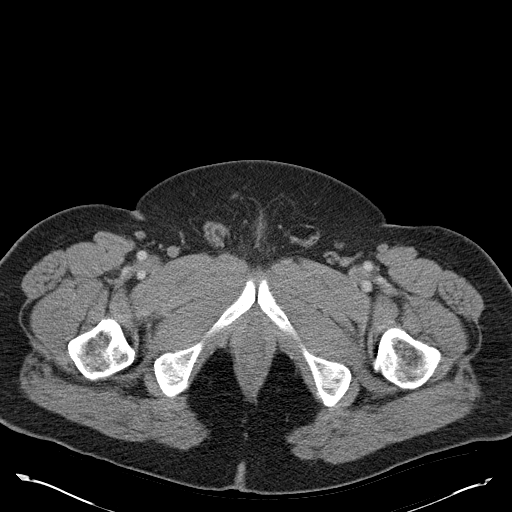
[im 7/103  bone]
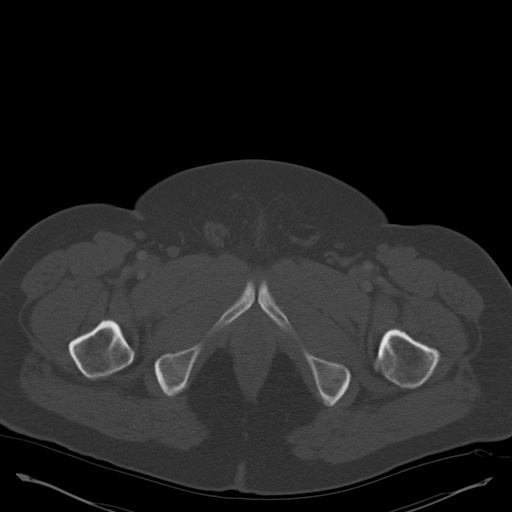
[im 13/103  soft-tissue]
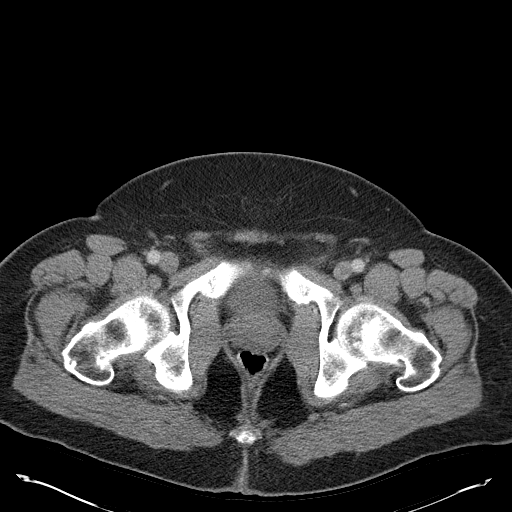
[im 25/103  soft-tissue]
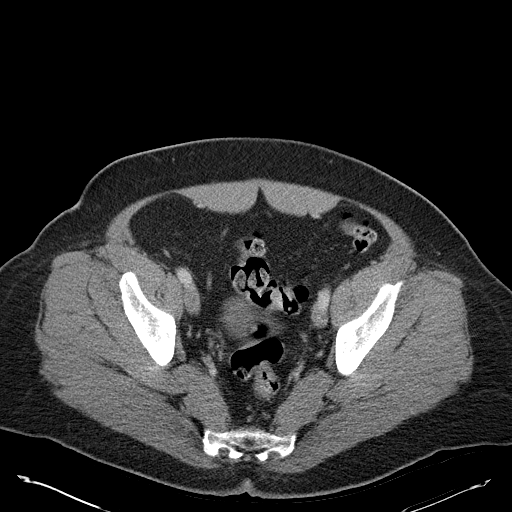
[im 31/103  soft-tissue]
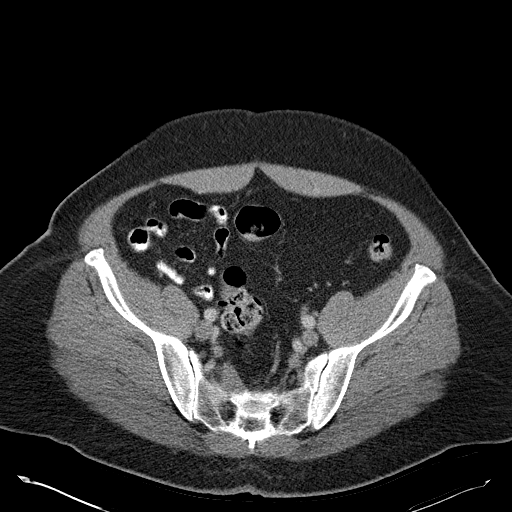
[im 37/103  soft-tissue]
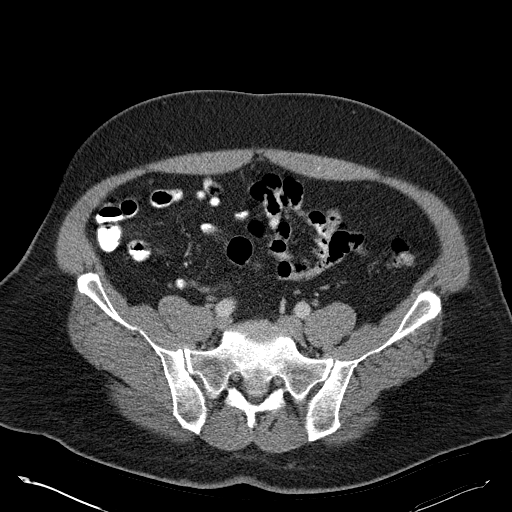
[im 43/103  soft-tissue]
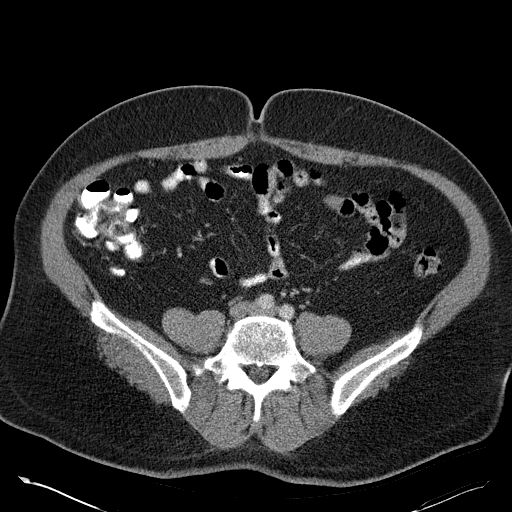
[im 55/103  soft-tissue]
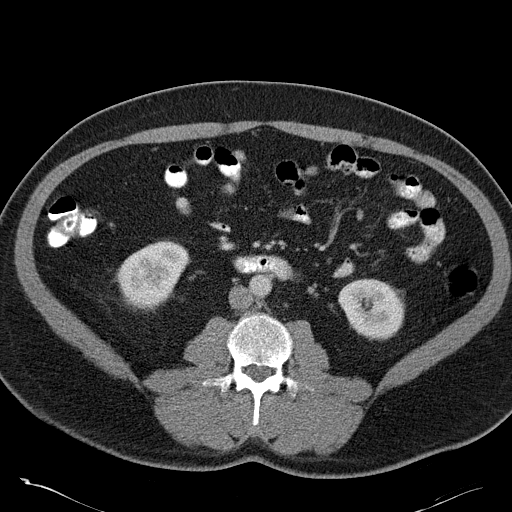
[im 61/103  soft-tissue]
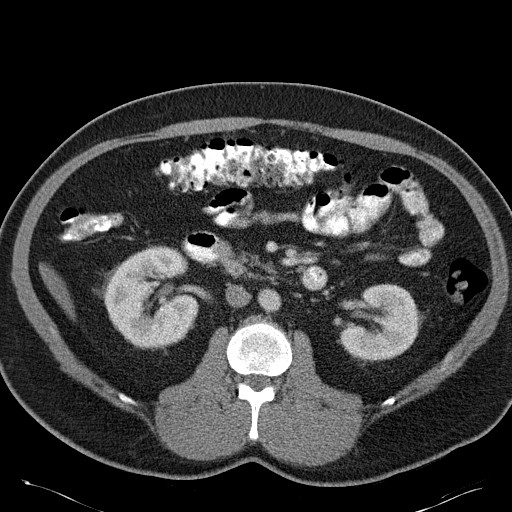
[im 67/103  soft-tissue]
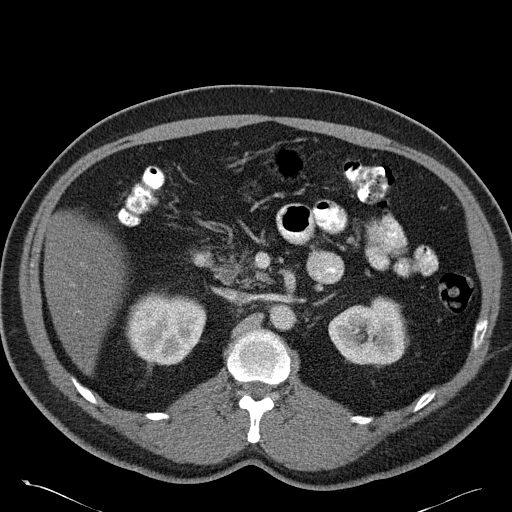
[im 67/103  bone]
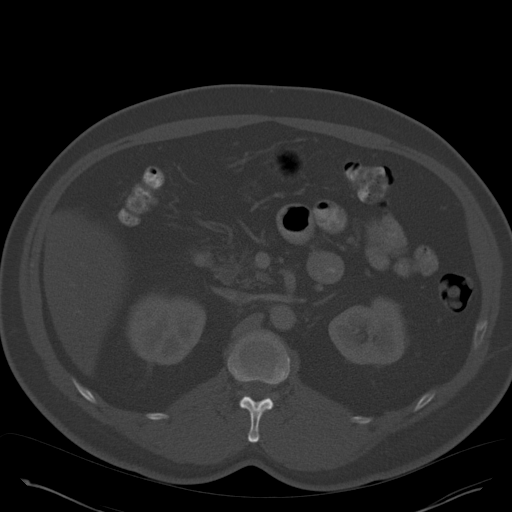
[im 73/103  soft-tissue]
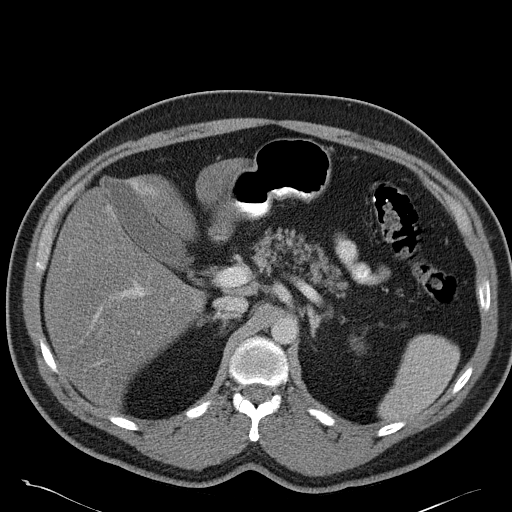
[im 79/103  soft-tissue]
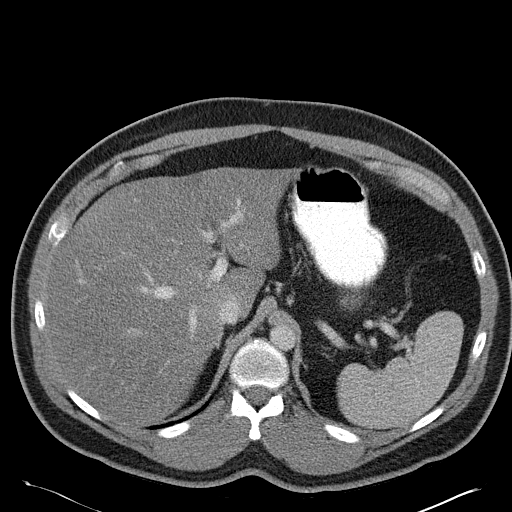
[im 91/103  soft-tissue]
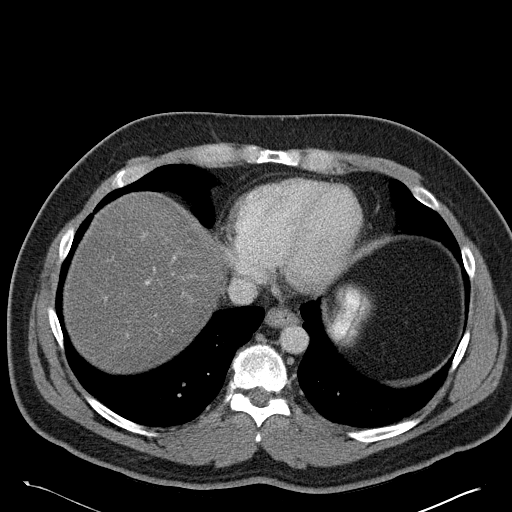
[im 97/103  soft-tissue]
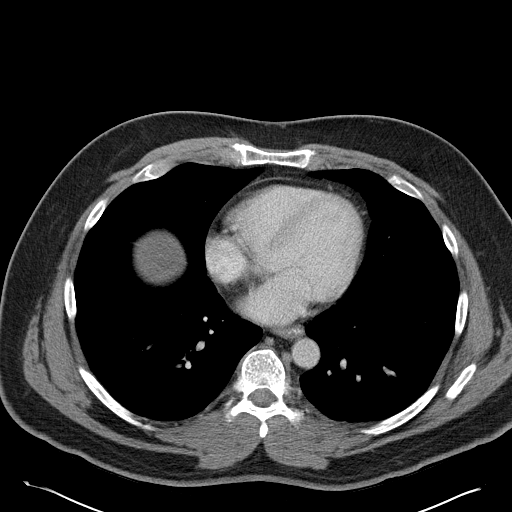

[Series 3: abd_pel_with 3.0 spo · coronal · 0.81mm/px · 3 of 107 slices shown]
[im 36/107  soft-tissue]
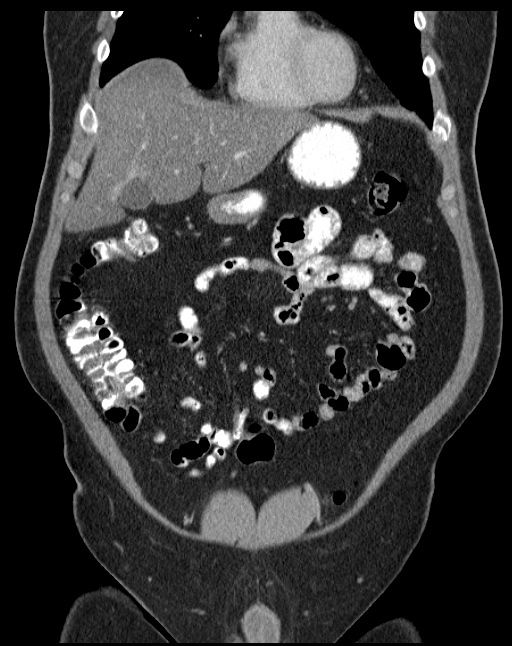
[im 48/107  soft-tissue]
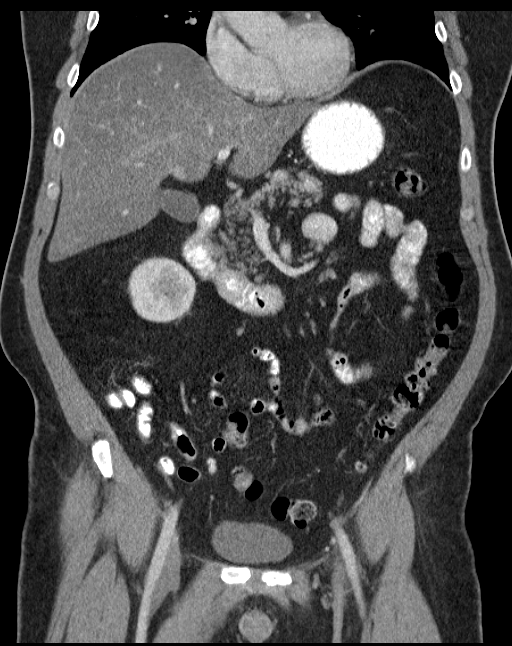
[im 59/107  soft-tissue]
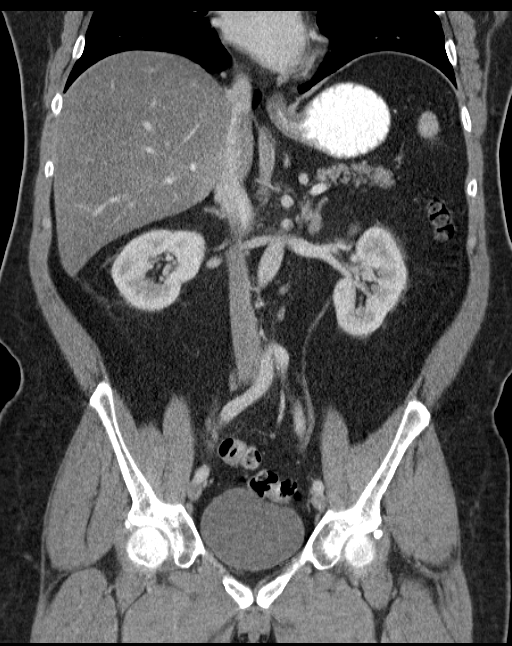

[16 of 46 positions shown; findings below may reference images not displayed]

FINDINGS: Lower chest: There minimal atelectasis of posterior lung bases.
There is no focal pneumonia or pleural effusion. The heart size is
normal.

Hepatobiliary: There is diffuse low density of liver without vessel
displacement. No focal liver lesion is identified. The gallbladder
is normal. The biliary tree is normal.

Pancreas: No mass, inflammatory changes, or other significant
abnormality.

Spleen: Within normal limits in size and appearance.

Adrenals/Urinary Tract: No masses identified. No evidence of
hydronephrosis.

Stomach/Bowel: No evidence of obstruction, inflammatory process, or
abnormal fluid collections. The appendix is normal.

Vascular/Lymphatic: No pathologically enlarged lymph nodes. No
evidence of abdominal aortic aneurysm.

Reproductive: No mass or other significant abnormality.

Other: None.

Musculoskeletal: No acute abnormalities identified within the bones.
IMPRESSION: No acute abnormality identified in the abdomen pelvis.

Fatty infiltration of liver.

## 2017-08-16 NOTE — Telephone Encounter (Signed)
We have tried to contact patient multiple times over the last month via phone and mail with no response

## 2017-08-23 ENCOUNTER — Ambulatory Visit (INDEPENDENT_AMBULATORY_CARE_PROVIDER_SITE_OTHER): Payer: Managed Care, Other (non HMO) | Admitting: Family Medicine

## 2017-08-23 VITALS — BP 128/82 | Ht 66.0 in | Wt 240.2 lb

## 2017-08-23 DIAGNOSIS — E7849 Other hyperlipidemia: Secondary | ICD-10-CM | POA: Diagnosis not present

## 2017-08-23 DIAGNOSIS — Z125 Encounter for screening for malignant neoplasm of prostate: Secondary | ICD-10-CM

## 2017-08-23 DIAGNOSIS — M5412 Radiculopathy, cervical region: Secondary | ICD-10-CM

## 2017-08-23 DIAGNOSIS — R740 Nonspecific elevation of levels of transaminase and lactic acid dehydrogenase [LDH]: Secondary | ICD-10-CM | POA: Diagnosis not present

## 2017-08-23 DIAGNOSIS — I10 Essential (primary) hypertension: Secondary | ICD-10-CM

## 2017-08-23 DIAGNOSIS — R7401 Elevation of levels of liver transaminase levels: Secondary | ICD-10-CM

## 2017-08-23 MED ORDER — CYCLOBENZAPRINE HCL 10 MG PO TABS: 10.0000 mg | ORAL_TABLET | Freq: Three times a day (TID) | ORAL | 5 refills | Status: DC | PRN

## 2017-08-23 MED ORDER — TRAZODONE HCL 50 MG PO TABS: ORAL_TABLET | ORAL | 6 refills | Status: DC

## 2017-08-23 MED ORDER — DIAZEPAM 5 MG PO TABS: 5.0000 mg | ORAL_TABLET | Freq: Four times a day (QID) | ORAL | 1 refills | Status: DC | PRN

## 2017-08-23 MED ORDER — MELOXICAM 15 MG PO TABS: 15.0000 mg | ORAL_TABLET | Freq: Every day | ORAL | 5 refills | Status: DC

## 2017-08-23 NOTE — Progress Notes (Signed)
Subjective:    Patient ID: Bryan Gilbert, male    DOB: 1966-03-13, 52 y.o.   MRN: [id number]  HPI  Patient states he is currently under pain management at [facility]. Patient states they were going to contact us about a MRI-patient states he has to have anesthesia and be in twilight with PACU recovery for a MRI Also discuss results from [place]  The patient relates that the pain management place would like for him to undergo a detailed MRI of his neck at [facility].  Patient states he cannot tolerate MRI machine because of anxiety and claustrophobia and the last time he had to do this he required general sedation  Apparently the pain management place will be connecting with [place] regarding this issue  Patient has elevated liver enzymes more than likely has fatty liver he does agree to do additional lab work may well need up-to-date ultrasound of the liver await the lab work first  Hyperlipidemia patient understands the importance of working hard at losing weight watching diet and getting cholesterol under control  HTN Patient for blood pressure check up. Patient relates compliance with meds. Todays BP reviewed with the patient. Patient denies issues with medication. Patient relates reasonable diet. Patient tries to minimize salt. Patient aware of BP goals.   Patient also has sleep apnea but states he has not been using a sleep apnea machine lately because it was causing him to have significant nausea Review of Systems  Constitutional: Negative for activity change.  HENT: Negative for congestion and rhinorrhea.   Respiratory: Negative for cough and shortness of breath.   Cardiovascular: Negative for chest pain.  Gastrointestinal: Negative for abdominal pain, diarrhea, nausea and vomiting.  Genitourinary: Negative for dysuria and hematuria.  Neurological: Negative for weakness and headaches.  Psychiatric/Behavioral: Negative for confusion.       Objective:   Physical Exam  Constitutional:  He appears well-nourished.  Cardiovascular: Normal rate, regular rhythm and normal heart sounds.  No murmur heard. Pulmonary/Chest: Effort normal and breath sounds normal.  Musculoskeletal: He exhibits no edema.  Lymphadenopathy:    He has no cervical adenopathy.  Neurological: He is alert.  Psychiatric: His behavior is normal.  Vitals reviewed.    What they need from [place] in order for them to set up the MRI Patient has been advised     Assessment & Plan:  1. Essential hypertension Blood pressure overall good condition.  Continue current medications.  Check lab work watch diet  2. Cervical neuralgia Patient was severe pain is currently under pain management.  They are wanting to do a up-to-date MRI in the last time he did this he was unable to tolerate having a MRI without having to be completely sedated at [facility] he will send [place] information from [facility] regarding what they need from [place] Patient has been advised to use his sleep apnea machine on a regular basis Patient has intractable pain that radiates into both shoulders 3. Elevated transaminase level Previous lab work liver enzyme elevated this is been this way for years more than likely fatty liver will be doing additional lab work await the results may need ultrasound up-to-date We need to rule out other more potentially serious causes of elevated liver enzyme 4. Other hyperlipidemia Lipid profile recommended Healthy diet recommended losing weight recommended Screening PSA ordered  Patient was encouraged to use a sleep apnea machine  Patient was encouraged to use the muscle relaxer is very infrequently especially Valium but  he does use it for cervical spasms.  He has done so for years.

## 2017-08-24 ENCOUNTER — Encounter: Payer: Self-pay | Admitting: Family Medicine

## 2017-08-24 NOTE — Telephone Encounter (Signed)
I discussed this with the patient when he came in for his office visit he will do a follow-up lab work if abnormal will need ultrasound-patient is aware

## 2017-08-26 ENCOUNTER — Encounter: Payer: Self-pay | Admitting: Family Medicine

## 2017-08-26 ENCOUNTER — Telehealth: Payer: Self-pay | Admitting: Family Medicine

## 2017-08-26 NOTE — Telephone Encounter (Signed)
Please make sure the letter that I just dictated is sent to [facility].  The form for this is in my chair.  Thank you

## 2017-10-20 ENCOUNTER — Telehealth: Payer: Self-pay | Admitting: Family Medicine

## 2017-10-20 ENCOUNTER — Encounter: Payer: Self-pay | Admitting: Family Medicine

## 2017-10-20 ENCOUNTER — Other Ambulatory Visit: Payer: Self-pay | Admitting: Family Medicine

## 2017-10-20 NOTE — Telephone Encounter (Signed)
Nurses- this patient is on several pain medicines that are not on his electronic record.  Please put these on his electronic record Stadol nasal spray-prescribed by Dr. Daylene Katayama neurologist at [facility], alsoBelbuca 300 mcg film use daily prescribed by [facility], hydrocodone 10 mg / 325 mg 1 every 4 hours as needed pain prescribed by Jeanella Anton.  It should also be noted that we will be sending a letter to the patient stating that we will not be prescribing diazepam ongoing that he will need to get this through his neurology specialist.  The reason for this change is because he is on such strong amount of narcotics that it is not recommended to be on a benzodiazepine.  It should also be noted that we will not be prescribing Flexeril to him.  I will be sending him a letter regarding this.

## 2017-10-20 NOTE — Telephone Encounter (Signed)
Erica-I did do a letter for this patient please send certified thank you

## 2017-10-21 ENCOUNTER — Other Ambulatory Visit: Payer: Self-pay | Admitting: *Deleted

## 2017-10-21 ENCOUNTER — Encounter: Payer: Self-pay | Admitting: *Deleted

## 2017-10-21 NOTE — Addendum Note (Signed)
Addended by: Dairl Ponder on: 10/21/2017 10:08 AM   Modules accepted: Orders

## 2017-10-21 NOTE — Telephone Encounter (Signed)
Medication list updated in EPIC

## 2017-11-19 ENCOUNTER — Other Ambulatory Visit: Payer: Self-pay | Admitting: Family Medicine

## 2017-12-15 ENCOUNTER — Other Ambulatory Visit: Payer: Self-pay | Admitting: Family Medicine

## 2018-02-21 ENCOUNTER — Encounter: Payer: Self-pay | Admitting: Family Medicine

## 2018-02-21 ENCOUNTER — Ambulatory Visit (INDEPENDENT_AMBULATORY_CARE_PROVIDER_SITE_OTHER): Payer: Managed Care, Other (non HMO) | Admitting: Family Medicine

## 2018-02-21 VITALS — BP 112/76 | Ht 66.0 in | Wt 228.0 lb

## 2018-02-21 DIAGNOSIS — Z13220 Encounter for screening for lipoid disorders: Secondary | ICD-10-CM

## 2018-02-21 DIAGNOSIS — R740 Nonspecific elevation of levels of transaminase and lactic acid dehydrogenase [LDH]: Secondary | ICD-10-CM | POA: Diagnosis not present

## 2018-02-21 DIAGNOSIS — Z79899 Other long term (current) drug therapy: Secondary | ICD-10-CM

## 2018-02-21 DIAGNOSIS — Z125 Encounter for screening for malignant neoplasm of prostate: Secondary | ICD-10-CM

## 2018-02-21 DIAGNOSIS — R0981 Nasal congestion: Secondary | ICD-10-CM

## 2018-02-21 DIAGNOSIS — R7401 Elevation of levels of liver transaminase levels: Secondary | ICD-10-CM

## 2018-02-21 DIAGNOSIS — G4733 Obstructive sleep apnea (adult) (pediatric): Secondary | ICD-10-CM

## 2018-02-21 DIAGNOSIS — I10 Essential (primary) hypertension: Secondary | ICD-10-CM

## 2018-02-21 NOTE — Progress Notes (Signed)
Subjective:    Patient ID: Bryan Gilbert, male    DOB: May 16, 1966, 52 y.o.   MRN: [id number]  HPI Patient is here today to follow up on chronic health issues. Patient is under the care of a specialist for pain management He still has recurring neck pains for which is required for him to get injections of his neck He states that Valium helps him relax he denies abusing it and states only uses it around that time Please see discussion below  As long as the patient is using the Valium only occasionally I will prescribe a small amount at a time  Patient for blood pressure check up.  The patient does have hypertension.  The patient is on medication.  Patient relates compliance with meds. Todays BP reviewed with the patient. Patient denies issues with medication. Patient relates reasonable diet. Patient tries to minimize salt. Patient aware of BP goals.  Reflux related issues takes Protonix on a regular basis does a good job of helping him out denies any dysphasia issues  Insomnia chronic uses medication obviously avoids control medications for this  Does have sleep apnea he states he uses a machine on a regular basis but does not feel is helping his much he relates a lot of daytime fatigue tiredness he is requesting a follow-up study on his sleep apnea.  Also see specialists for cyclical vomiting syndrome under good control currently.  He eats healthy and does not exercise as he should.He sees a neurologist at Dr.Dr. Theda Sers.   Sees Dr. Veleta Miners for cervical spine injections.  Review of Systems  Constitutional: Negative for diaphoresis and fatigue.  HENT: Negative for congestion and rhinorrhea.   Respiratory: Negative for cough and shortness of breath.   Cardiovascular: Negative for chest pain and leg swelling.  Gastrointestinal: Negative for abdominal pain and diarrhea.  Musculoskeletal: Positive for arthralgias and back pain.  Skin: Negative for color change and rash.  Neurological:  Negative for dizziness and headaches.  Psychiatric/Behavioral: Negative for behavioral problems and confusion.       Objective:   Physical Exam  Constitutional: He appears well-nourished. No distress.  HENT:  Head: Normocephalic and atraumatic.  Eyes: Right eye exhibits no discharge. Left eye exhibits no discharge.  Neck: No tracheal deviation present.  Cardiovascular: Normal rate, regular rhythm and normal heart sounds.  No murmur heard. Pulmonary/Chest: Effort normal and breath sounds normal. No respiratory distress. He has no wheezes.  Musculoskeletal: He exhibits no edema.  Lymphadenopathy:    He has no cervical adenopathy.  Neurological: He is alert.  Skin: Skin is warm. No rash noted.  Psychiatric: His behavior is normal.  Vitals reviewed.    25 minutes was spent with the patient.  This statement verifies that 25 minutes was indeed spent with the patient.  More than 50% of this visit-total duration of the visit-was spent in counseling and coordination of care. The issues that the patient came in for today as reflected in the diagnosis (s) please refer to documentation for further details.      Assessment & Plan:  Has sleep apnea, needs update-he does not feels the CPAP machine is helping him much anymore and he is interested in getting retested and possibly recalibrated on machine-he will need to be seen by ENT coming up possibly same group that helps him with ENT can also help him with sleep study if not sleep study can be done locally.  Patient has chronic nasal congestion and is requesting referral back  to ENT at [facility] for possible surgery-patient having chronic nasal congestion also with repetitive sinus infections therefore would benefit from ENT evaluation he saw specialist several years ago at [facility] would like to go back   seroquel-patient uses intermittently for sleep at times it does make him tired I recommended a half dose he will try half a tablet-if it  seems to be helping him well he will let us know we will help prescribe 50 mg tablets  Valium- [name]-we will connect with his pain management doctor to make sure that they are okay with him using Valium on an occasional basis when he gets neck injections  Increased risk of colon cancer recommended colonoscopy patient defers currently because of time concerned but also patient states does not tolerate GI prep he will think about referral to gastroenterology  Patient agrees to do blood test for PSA but does not want prostate exam  Patient will do lab work within the next 30 days  Follow-up 6 months-patient defers on wellness defers on colonoscopy currently if he does decide to get it he will let us know  Patient reassures me that Valium is only used when he has injections of his neck when he does not use it at any other time I am fine with this but I recommend that the patient use this sparingly because of increased risk of accidental overdose

## 2018-03-12 LAB — LIPID PANEL
Chol/HDL Ratio: 4.3 ratio (ref 0.0–5.0)
Cholesterol, Total: 200 mg/dL — ABNORMAL HIGH (ref 100–199)
HDL: 46 mg/dL (ref >39)
LDL Calculated: 103 mg/dL — ABNORMAL HIGH (ref 0–99)
Triglycerides: 254 mg/dL — ABNORMAL HIGH (ref 0–149)
VLDL Cholesterol Cal: 51 mg/dL — ABNORMAL HIGH (ref 5–40)

## 2018-03-12 LAB — HEPATIC FUNCTION PANEL
ALT: 45 IU/L — ABNORMAL HIGH (ref 0–44)
AST: 31 IU/L (ref 0–40)
Albumin: 4.2 g/dL (ref 3.5–5.5)
Alkaline Phosphatase: 89 IU/L (ref 39–117)
Bilirubin Total: 0.3 mg/dL (ref 0.0–1.2)
Bilirubin, Direct: 0.11 mg/dL (ref 0.00–0.40)
Total Protein: 7.6 g/dL (ref 6.0–8.5)

## 2018-03-12 LAB — BASIC METABOLIC PANEL
BUN/Creatinine Ratio: 13 (ref 9–20)
BUN: 12 mg/dL (ref 6–24)
CO2: 25 mmol/L (ref 20–29)
Calcium: 9.6 mg/dL (ref 8.7–10.2)
Chloride: 99 mmol/L (ref 96–106)
Creatinine, Ser: 0.93 mg/dL (ref 0.76–1.27)
GFR calc Af Amer: 109 mL/min/{1.73_m2} (ref >59)
GFR calc non Af Amer: 95 mL/min/{1.73_m2} (ref >59)
Glucose: 145 mg/dL — ABNORMAL HIGH (ref 65–99)
Potassium: 4.3 mmol/L (ref 3.5–5.2)
Sodium: 139 mmol/L (ref 134–144)

## 2018-03-12 LAB — PSA: Prostate Specific Ag, Serum: 0.2 ng/mL (ref 0.0–4.0)

## 2018-04-03 ENCOUNTER — Other Ambulatory Visit: Payer: Self-pay | Admitting: Family Medicine

## 2018-04-04 ENCOUNTER — Other Ambulatory Visit: Payer: Self-pay

## 2018-04-04 MED ORDER — LISINOPRIL-HYDROCHLOROTHIAZIDE 10-12.5 MG PO TABS: 1.0000 | ORAL_TABLET | Freq: Every day | ORAL | 2 refills | Status: DC

## 2018-06-10 ENCOUNTER — Other Ambulatory Visit: Payer: Self-pay | Admitting: Family Medicine

## 2018-08-23 ENCOUNTER — Ambulatory Visit: Payer: Managed Care, Other (non HMO) | Admitting: Family Medicine

## 2018-08-24 ENCOUNTER — Ambulatory Visit (INDEPENDENT_AMBULATORY_CARE_PROVIDER_SITE_OTHER): Payer: Managed Care, Other (non HMO) | Admitting: Family Medicine

## 2018-08-24 ENCOUNTER — Encounter: Payer: Self-pay | Admitting: Family Medicine

## 2018-08-24 VITALS — BP 118/82 | Ht 66.0 in | Wt 234.6 lb

## 2018-08-24 DIAGNOSIS — G4733 Obstructive sleep apnea (adult) (pediatric): Secondary | ICD-10-CM | POA: Diagnosis not present

## 2018-08-24 DIAGNOSIS — I10 Essential (primary) hypertension: Secondary | ICD-10-CM | POA: Diagnosis not present

## 2018-08-24 DIAGNOSIS — R739 Hyperglycemia, unspecified: Secondary | ICD-10-CM | POA: Diagnosis not present

## 2018-08-24 DIAGNOSIS — R7401 Elevation of levels of liver transaminase levels: Secondary | ICD-10-CM

## 2018-08-24 DIAGNOSIS — R740 Nonspecific elevation of levels of transaminase and lactic acid dehydrogenase [LDH]: Secondary | ICD-10-CM | POA: Diagnosis not present

## 2018-08-24 MED ORDER — PANTOPRAZOLE SODIUM 40 MG PO TBEC: DELAYED_RELEASE_TABLET | ORAL | 1 refills | Status: DC

## 2018-08-24 MED ORDER — LISINOPRIL-HYDROCHLOROTHIAZIDE 10-12.5 MG PO TABS: 1.0000 | ORAL_TABLET | Freq: Every day | ORAL | 2 refills | Status: DC

## 2018-08-24 MED ORDER — TRAZODONE HCL 50 MG PO TABS: ORAL_TABLET | ORAL | 1 refills | Status: DC

## 2018-08-24 MED ORDER — MELOXICAM 15 MG PO TABS: 15.0000 mg | ORAL_TABLET | Freq: Every day | ORAL | 2 refills | Status: DC

## 2018-08-24 NOTE — Progress Notes (Signed)
   Subjective:    Patient ID: Bryan Gilbert, male    DOB: 09-16-1965, 53 y.o.   MRN: [id number]  Hypertension  This is a chronic problem. The current episode started more than 1 year ago. Pertinent negatives include no chest pain, headaches or shortness of breath. Risk factors for coronary artery disease include male gender. Treatments tried: lisinopril. There are no compliance problems.    Patient is under the care of a specialist for his neck condition chronic pain is well denies problems there denies being depressed   Review of Systems  Constitutional: Negative for diaphoresis and fatigue.  HENT: Negative for congestion and rhinorrhea.   Respiratory: Negative for cough and shortness of breath.   Cardiovascular: Negative for chest pain and leg swelling.  Gastrointestinal: Negative for abdominal pain and diarrhea.  Skin: Negative for color change and rash.  Neurological: Negative for dizziness and headaches.  Psychiatric/Behavioral: Negative for behavioral problems and confusion.       Objective:   Physical Exam Vitals signs reviewed.  Constitutional:      General: He is not in acute distress. HENT:     Head: Normocephalic and atraumatic.  Eyes:     General:        Right eye: No discharge.        Left eye: No discharge.  Neck:     Trachea: No tracheal deviation.  Cardiovascular:     Rate and Rhythm: Normal rate and regular rhythm.     Heart sounds: Normal heart sounds. No murmur.  Pulmonary:     Effort: Pulmonary effort is normal. No respiratory distress.     Breath sounds: Normal breath sounds.  Lymphadenopathy:     Cervical: No cervical adenopathy.  Skin:    General: Skin is warm and dry.  Neurological:     Mental Status: He is alert.     Coordination: Coordination normal.  Psychiatric:        Behavior: Behavior normal.           Assessment & Plan:  HTN- Patient was seen today as part of a visit regarding hypertension. The importance of healthy diet and  regular physical activity was discussed. The importance of compliance with medications discussed.  Ideal goal is to keep blood pressure low elevated levels certainly below 353/29 when possible.  The patient was counseled that keeping blood pressure under control lessen his risk of complications.  The importance of regular follow-ups was discussed with the patient.  Low-salt diet such as DASH recommended.  Regular physical activity was recommended as well.  Patient was advised to keep regular follow-ups. He does have sleep apnea I recommend for him to continue using his CPAP machine Has a history of fatty liver weight loss healthy eating advised check lab work Follow-up 6 months

## 2018-10-18 ENCOUNTER — Encounter: Payer: Self-pay | Admitting: Family Medicine

## 2018-10-18 DIAGNOSIS — L918 Other hypertrophic disorders of the skin: Secondary | ICD-10-CM

## 2018-10-18 NOTE — Telephone Encounter (Signed)
Nurses Please assist the patient with a referral to Dr. Nevada Crane dermatology's office It may be as simple as just calling over there to give them a referral Please let us move forward with this and notify the patient

## 2018-10-18 NOTE — Addendum Note (Signed)
Addended by: Carmelina Noun on: 10/18/2018 11:32 AM   Modules accepted: Orders

## 2018-10-18 NOTE — Telephone Encounter (Signed)
Called dr hall's office and scheduled appt for pt on April 22nd at 10am with amber. Pt was notified of the appt.

## 2018-12-14 ENCOUNTER — Encounter: Payer: Self-pay | Admitting: Family Medicine

## 2018-12-16 NOTE — Telephone Encounter (Signed)
Nurses Generic Viagra is now available It is typically not covered by insurance Pharmacies such as [facility] may offer the best price Viagra 100 mg, #5, 1 refill Directions-take half tablet or a whole tablet 1 hour before relations as needed Common side effects nasal congestion stuffiness Erections lasting greater than 4 hours are rare but require ER visit Please communicate with patient-if he is interested in having this sent in please do so Or we can provide him with a printed prescription and he can shop around for the best price

## 2018-12-20 MED ORDER — SILDENAFIL CITRATE 100 MG PO TABS: ORAL_TABLET | ORAL | 1 refills | Status: DC

## 2018-12-20 NOTE — Addendum Note (Signed)
Addended by: Carmelina Noun on: 12/20/2018 01:23 PM   Modules accepted: Orders

## 2019-01-26 ENCOUNTER — Encounter: Payer: Self-pay | Admitting: Family Medicine

## 2019-01-26 DIAGNOSIS — E7849 Other hyperlipidemia: Secondary | ICD-10-CM

## 2019-01-26 DIAGNOSIS — Z114 Encounter for screening for human immunodeficiency virus [HIV]: Secondary | ICD-10-CM

## 2019-01-26 DIAGNOSIS — E349 Endocrine disorder, unspecified: Secondary | ICD-10-CM

## 2019-01-26 DIAGNOSIS — R739 Hyperglycemia, unspecified: Secondary | ICD-10-CM

## 2019-01-26 DIAGNOSIS — Z125 Encounter for screening for malignant neoplasm of prostate: Secondary | ICD-10-CM

## 2019-01-26 DIAGNOSIS — I10 Essential (primary) hypertension: Secondary | ICD-10-CM

## 2019-01-26 NOTE — Telephone Encounter (Signed)
Please let patient know that I recommend lipid, liver, metabolic 7, W3E along with testosterone level, PSA, HIV antibody per CDC guidelines  The patient should do the lab work first then we can help with urology referral for testosterone depending on lab results  Diagnosis hyperlipidemia hyperglycemia testosterone deficiency and screening for prostate cancer

## 2019-01-27 NOTE — Addendum Note (Signed)
Addended by: Vicente Males on: 01/27/2019 08:52 AM   Modules accepted: Orders

## 2019-02-01 LAB — LIPID PANEL
Chol/HDL Ratio: 4.7 ratio (ref 0.0–5.0)
Cholesterol, Total: 213 mg/dL — ABNORMAL HIGH (ref 100–199)
HDL: 45 mg/dL (ref >39)
LDL Calculated: 142 mg/dL — ABNORMAL HIGH (ref 0–99)
Triglycerides: 130 mg/dL (ref 0–149)
VLDL Cholesterol Cal: 26 mg/dL (ref 5–40)

## 2019-02-01 LAB — HEPATIC FUNCTION PANEL
ALT: 64 IU/L — ABNORMAL HIGH (ref 0–44)
AST: 34 IU/L (ref 0–40)
Albumin: 4.4 g/dL (ref 3.8–4.9)
Alkaline Phosphatase: 74 IU/L (ref 39–117)
Bilirubin Total: 0.5 mg/dL (ref 0.0–1.2)
Bilirubin, Direct: 0.15 mg/dL (ref 0.00–0.40)
Total Protein: 6.9 g/dL (ref 6.0–8.5)

## 2019-02-01 LAB — BASIC METABOLIC PANEL
BUN/Creatinine Ratio: 18 (ref 9–20)
BUN: 16 mg/dL (ref 6–24)
CO2: 23 mmol/L (ref 20–29)
Calcium: 9.5 mg/dL (ref 8.7–10.2)
Chloride: 100 mmol/L (ref 96–106)
Creatinine, Ser: 0.87 mg/dL (ref 0.76–1.27)
GFR calc Af Amer: 115 mL/min/{1.73_m2} (ref >59)
GFR calc non Af Amer: 99 mL/min/{1.73_m2} (ref >59)
Glucose: 109 mg/dL — ABNORMAL HIGH (ref 65–99)
Potassium: 4.4 mmol/L (ref 3.5–5.2)
Sodium: 141 mmol/L (ref 134–144)

## 2019-02-01 LAB — HIV ANTIBODY (ROUTINE TESTING W REFLEX): HIV Screen 4th Generation wRfx: NONREACTIVE

## 2019-02-01 LAB — HEMOGLOBIN A1C
Est. average glucose Bld gHb Est-mCnc: 117 mg/dL
Hgb A1c MFr Bld: 5.7 % — ABNORMAL HIGH (ref 4.8–5.6)

## 2019-02-01 LAB — PSA: Prostate Specific Ag, Serum: 0.2 ng/mL (ref 0.0–4.0)

## 2019-02-01 LAB — TESTOSTERONE: Testosterone: 61 ng/dL — ABNORMAL LOW (ref 264–916)

## 2019-02-03 NOTE — Addendum Note (Signed)
Addended by: Vicente Males on: 02/03/2019 04:39 PM   Modules accepted: Orders

## 2019-02-03 NOTE — Telephone Encounter (Signed)
Nurses Go ahead with referral to urology for low testosterone Please send patient my chart message letting him know referral has been made We will see him at his follow-up visit

## 2019-02-09 NOTE — Telephone Encounter (Signed)
Brendale Please be so kind as to send notification to the patient how he can go ahead and call him to help make his own appointment regarding [facility] the referral has been sent thank you so much

## 2019-02-10 ENCOUNTER — Encounter: Payer: Self-pay | Admitting: Family Medicine

## 2019-02-22 ENCOUNTER — Other Ambulatory Visit: Payer: Self-pay

## 2019-02-22 ENCOUNTER — Ambulatory Visit (INDEPENDENT_AMBULATORY_CARE_PROVIDER_SITE_OTHER): Payer: Managed Care, Other (non HMO) | Admitting: Family Medicine

## 2019-02-22 DIAGNOSIS — R740 Nonspecific elevation of levels of transaminase and lactic acid dehydrogenase [LDH]: Secondary | ICD-10-CM

## 2019-02-22 DIAGNOSIS — E291 Testicular hypofunction: Secondary | ICD-10-CM | POA: Diagnosis not present

## 2019-02-22 DIAGNOSIS — I10 Essential (primary) hypertension: Secondary | ICD-10-CM

## 2019-02-22 DIAGNOSIS — M25519 Pain in unspecified shoulder: Secondary | ICD-10-CM | POA: Diagnosis not present

## 2019-02-22 DIAGNOSIS — R7401 Elevation of levels of liver transaminase levels: Secondary | ICD-10-CM

## 2019-02-22 NOTE — Progress Notes (Signed)
Subjective:    Patient ID: Bryan Gilbert, male    DOB: [birth year], 53 y.o.   MRN: [id number]  Hypertension This is a chronic problem. Pertinent negatives include no chest pain, headaches or shortness of breath. There are no compliance problems.    Pt has question regarding Lisinopril-HCTZ. Pt is wanting to know if the Lisinopril-HCTZ has any effect on his hemoglobin count. The patient is concerned that it could cause his hemoglobin to go up we did discuss this I do not feel that would be the case  The patient is being followed by urology for hypogonadism and they are going to start him on testosterone  The patient does have underlying abdominal migraines and does not want to do a colonoscopy I did discuss other options he is willing to do the stool test for blood Virtual Visit via Video Note  I connected with Bryan Gilbert on 02/22/19 at  1:10 PM EDT by a video enabled telemedicine application and verified that I am speaking with the correct person using two identifiers.  Location: Patient: home Provider: office   I discussed the limitations of evaluation and management by telemedicine and the availability of in person appointments. The patient expressed understanding and agreed to proceed.  History of Present Illness:    Observations/Objective:   Assessment and Plan:   Follow Up Instructions:    I discussed the assessment and treatment plan with the patient. The patient was provided an opportunity to ask questions and all were answered. The patient agreed with the plan and demonstrated an understanding of the instructions.   The patient was advised to call back or seek an in-person evaluation if the symptoms worsen or if the condition fails to improve as anticipated.  I provided 15 minutes of non-face-to-face time during this encounter.   Vicente Males, LPN    Review of Systems  Constitutional: Negative for diaphoresis and fatigue.  HENT: Negative for congestion and  rhinorrhea.   Respiratory: Negative for cough and shortness of breath.   Cardiovascular: Negative for chest pain and leg swelling.  Gastrointestinal: Negative for abdominal pain and diarrhea.  Skin: Negative for color change and rash.  Neurological: Negative for dizziness and headaches.  Psychiatric/Behavioral: Negative for behavioral problems and confusion.       Objective:   Physical Exam Today's visit was via telephone Physical exam was not possible for this visit        Assessment & Plan:  Telephone visit Patient found not capable of doing video because of poor cell signal  1. Essential hypertension As best patient can tell blood pressure under good control takes his medicine on a regular basis recent labs look good  2. Elevated transaminase level Patient does have slight elevation of liver enzyme he states he will do a good job of staying away from alcohol eating healthy and keeping physically active we will relook at this again in 6 months if it is going up he will need further work-up right now I believe it is fatty liver patient was warned that fatty liver can become cirrhosis and that it is in his best interest to lose weight exercise stay healthy  3. Hypogonadism male Hopefully testosterone give him more energy but if he is still having significant troubles then the next step would be his consideration for sleep study  4. Acute shoulder pain, unspecified laterality I am concerned about what is going on with the patient with the shoulder.  He states he would  like to do a in office visit we would do this within the next 2 to 3 weeks to examine may need to have x-rays   15 minutes was spent with patient today discussing healthcare issues which they came.  More than 50% of this visit-total duration of visit-was spent in counseling and coordination of care.  Please see diagnosis regarding the focus of this coordination and care

## 2019-02-24 ENCOUNTER — Other Ambulatory Visit: Payer: Self-pay | Admitting: Family Medicine

## 2019-03-01 ENCOUNTER — Encounter: Payer: Self-pay | Admitting: Family Medicine

## 2019-03-01 ENCOUNTER — Other Ambulatory Visit: Payer: Self-pay

## 2019-03-01 ENCOUNTER — Ambulatory Visit (INDEPENDENT_AMBULATORY_CARE_PROVIDER_SITE_OTHER): Payer: Managed Care, Other (non HMO) | Admitting: Family Medicine

## 2019-03-01 VITALS — BP 116/78 | Temp 98.2°F | Ht 66.0 in | Wt 224.0 lb

## 2019-03-01 DIAGNOSIS — M67911 Unspecified disorder of synovium and tendon, right shoulder: Secondary | ICD-10-CM | POA: Diagnosis not present

## 2019-03-01 NOTE — Progress Notes (Signed)
   Subjective:    Patient ID: Bryan Gilbert, male    DOB: 06/17/1966, 53 y.o.   MRN: [id number]  Shoulder Pain  Pain location: right shoulder. This is a chronic problem. Episode onset: 8 -9 months ago. Pertinent negatives include no fever. Treatments tried: stretching.  Patient where shoulder discomfort and pain with certain movements.  Denies any particular injuries.  This is been going on for over 6 months he is tried various stretching exercises without help he is tried anti-inflammatories.  He does not know of any specific injury.  He does have some chronic neck issues He relates the pain in the posterior shoulder also to the side of the deltoid as well as the anterior shoulder PMH benign December started in December Tried stretching Tried nmassage  Cant lay on it Across the top Worse with ceratian movements No radicular sysmptoms Hurts with ativity Review of Systems  Constitutional: Negative for activity change, fatigue and fever.  HENT: Negative for congestion and rhinorrhea.   Respiratory: Negative for cough and shortness of breath.   Cardiovascular: Negative for chest pain and leg swelling.  Gastrointestinal: Negative for abdominal pain, diarrhea and nausea.  Genitourinary: Negative for dysuria and hematuria.  Musculoskeletal: Positive for arthralgias. Negative for back pain.  Neurological: Negative for weakness and headaches.  Psychiatric/Behavioral: Negative for agitation and behavioral problems.       Objective:   Physical Exam  Lungs clear respiratory rate normal heart regular no murmurs neck some subjective tenderness right side of the neck shoulder has multiple difficulties with range of motion lateral extension going over his head and behind his back he has weakness in the infraspinatus muscle consistent with rotator cuff tendinitis      Assessment & Plan:  This is been going on for 6 months I showed him some physical therapy exercises gave him a pamphlet I  recommend shoulder x-ray Anti-inflammatory If ongoing troubles and not improving over the next 2 weeks next step would be MRI possible orthopedic referral etc. and physical therapy

## 2019-03-05 ENCOUNTER — Other Ambulatory Visit: Payer: Self-pay | Admitting: Family Medicine

## 2019-04-24 ENCOUNTER — Ambulatory Visit (INDEPENDENT_AMBULATORY_CARE_PROVIDER_SITE_OTHER): Payer: Managed Care, Other (non HMO) | Admitting: Family Medicine

## 2019-04-24 ENCOUNTER — Encounter: Payer: Self-pay | Admitting: Family Medicine

## 2019-04-24 DIAGNOSIS — J3489 Other specified disorders of nose and nasal sinuses: Secondary | ICD-10-CM

## 2019-04-24 NOTE — Progress Notes (Signed)
   Subjective:    Patient ID: Bryan Gilbert, male    DOB: 08/22/1965, 53 y.o.   MRN: [id number]  Sinusitis This is a new problem. The current episode started 1 to 4 weeks ago (Friday became worse). (Sinus pressure above eyebrows; green colored mucus, low grade fever Friday night) Treatments tried: Sudafed. The treatment provided mild relief.   Virtual Visit via Telephone Note  I connected with Bryan Gilbert on 04/24/19 at  3:00 PM EDT by telephone and verified that I am speaking with the correct person using two identifiers.  Location: Patient: home Provider: office   I discussed the limitations, risks, security and privacy concerns of performing an evaluation and management service by telephone and the availability of in person appointments. I also discussed with the patient that there may be a patient responsible charge related to this service. The patient expressed understanding and agreed to proceed.   History of Present Illness:    Observations/Objective:   Assessment and Plan:   Follow Up Instructions:    I discussed the assessment and treatment plan with the patient. The patient was provided an opportunity to ask questions and all were answered. The patient agreed with the plan and demonstrated an understanding of the instructions.   The patient was advised to call back or seek an in-person evaluation if the symptoms worsen or if the condition fails to improve as anticipated.  I provided 0 minutes of non-face-to-face time during this encounter.   Vicente Males, LPN    Review of Systems     Objective:   Physical Exam  Tried multiple times to connect with the patient via the phone numbers he left in was unable to do so      Assessment & Plan:  Sent patient message to reschedule his appointment

## 2019-04-26 ENCOUNTER — Other Ambulatory Visit: Payer: Self-pay

## 2019-04-26 ENCOUNTER — Ambulatory Visit: Payer: Managed Care, Other (non HMO) | Admitting: Family Medicine

## 2019-04-26 ENCOUNTER — Ambulatory Visit (INDEPENDENT_AMBULATORY_CARE_PROVIDER_SITE_OTHER): Payer: Managed Care, Other (non HMO) | Admitting: Family Medicine

## 2019-04-26 DIAGNOSIS — J019 Acute sinusitis, unspecified: Secondary | ICD-10-CM

## 2019-04-26 MED ORDER — AMOXICILLIN-POT CLAVULANATE 875-125 MG PO TABS: 1.0000 | ORAL_TABLET | Freq: Two times a day (BID) | ORAL | 0 refills | Status: DC

## 2019-04-26 NOTE — Progress Notes (Signed)
   Subjective:    Patient ID: Bryan Gilbert, male    DOB: 1966-04-10, 53 y.o.   MRN: [id number]  Sinusitis This is a new problem. Associated symptoms include congestion and coughing. Pertinent negatives include no chills or ear pain. (Pressure/pain about eyebrows, yellow drainage, spiked a low grade fever on Friday ) Treatments tried: Sinus washing. The treatment provided no relief.  Significant sinus congestion drainage is been going on for several weeks denies high fever chills wheezing difficulty breathing no nausea vomiting diarrhea.  No body aches or high fevers PMH benign frequent sinus Virtual Visit via Telephone Note  I connected with Bryan Gilbert on 04/26/19 at 11:00 AM EDT by telephone and verified that I am speaking with the correct person using two identifiers.  Location: Patient: home Provider: office   I discussed the limitations, risks, security and privacy concerns of performing an evaluation and management service by telephone and the availability of in person appointments. I also discussed with the patient that there may be a patient responsible charge related to this service. The patient expressed understanding and agreed to proceed.   History of Present Illness:    Observations/Objective:   Assessment and Plan:   Follow Up Instructions:    I discussed the assessment and treatment plan with the patient. The patient was provided an opportunity to ask questions and all were answered. The patient agreed with the plan and demonstrated an understanding of the instructions.   The patient was advised to call back or seek an in-person evaluation if the symptoms worsen or if the condition fails to improve as anticipated.  I provided 15 minutes of non-face-to-face time during this encounter.   Vicente Males, LPN     Review of Systems  Constitutional: Negative for activity change, chills and fever.  HENT: Positive for congestion and rhinorrhea. Negative for ear  pain.   Eyes: Negative for discharge.  Respiratory: Positive for cough. Negative for wheezing.   Cardiovascular: Negative for chest pain.  Gastrointestinal: Negative for nausea and vomiting.  Musculoskeletal: Negative for arthralgias.       Objective:   Physical Exam  Patient Today's visit was via telephone Physical exam was not possible for this visit      Assessment & Plan:  Acute rhinosinusitis Antibiotic prescribed warning signs discussed follow-up if progressive troubles or problems

## 2019-05-08 ENCOUNTER — Encounter: Payer: Self-pay | Admitting: Family Medicine

## 2019-05-08 DIAGNOSIS — M25529 Pain in unspecified elbow: Secondary | ICD-10-CM

## 2019-05-10 NOTE — Telephone Encounter (Signed)
Nurses please go ahead with Ortho referral either Dr. Aline Brochure or [facility]

## 2019-05-11 NOTE — Addendum Note (Signed)
Addended by: Dairl Ponder on: 05/11/2019 04:18 PM   Modules accepted: Orders

## 2019-05-12 ENCOUNTER — Ambulatory Visit (INDEPENDENT_AMBULATORY_CARE_PROVIDER_SITE_OTHER): Payer: Managed Care, Other (non HMO) | Admitting: Family Medicine

## 2019-05-12 ENCOUNTER — Other Ambulatory Visit: Payer: Self-pay

## 2019-05-12 ENCOUNTER — Ambulatory Visit (HOSPITAL_COMMUNITY)
Admission: RE | Admit: 2019-05-12 | Discharge: 2019-05-12 | Disposition: A | Discharge status: Home / Self Care | Payer: Managed Care, Other (non HMO) | Source: Ambulatory Visit | Attending: Family Medicine | Admitting: Family Medicine

## 2019-05-12 DIAGNOSIS — M67911 Unspecified disorder of synovium and tendon, right shoulder: Secondary | ICD-10-CM | POA: Insufficient documentation

## 2019-05-12 DIAGNOSIS — L03119 Cellulitis of unspecified part of limb: Secondary | ICD-10-CM | POA: Diagnosis not present

## 2019-05-12 MED ORDER — AMOXICILLIN-POT CLAVULANATE 875-125 MG PO TABS: 1.0000 | ORAL_TABLET | Freq: Two times a day (BID) | ORAL | 0 refills | Status: DC

## 2019-05-12 NOTE — Progress Notes (Signed)
   Subjective:    Patient ID: Bryan Gilbert, male    DOB: 1965-10-26, 53 y.o.   MRN: [id number]  HPI  Patient arrives with painful swollen elbow- believes  it is infected again. Patient had recent cellulitis was treated with antibiotics improved but then it started coming back denies any nausea vomiting diarrhea Review of Systems Patient relates some redness around the elbow difficult time extending is able to supinate and pronate without trouble no shoulder trouble no wrist trouble low-grade fever yesterday    Objective:   Physical Exam  Shoulder normal wrist normal forearm normal bicep region normal olecranon region cellulitis is able to extend the arm and flex it but slowly because of pain able to supinate and pronate no fluctuance felt      Assessment & Plan:  Elbow cellulitis Olecranon cellulitis Warm compresses frequently Antibiotics for 2 weeks Referral to orthopedics

## 2019-05-14 ENCOUNTER — Telehealth: Payer: Self-pay | Admitting: Family Medicine

## 2019-05-14 NOTE — Telephone Encounter (Signed)
Nurses This past Friday I saw the patient for right elbow infection he needs to see orthopedics this week The referral was put in but I am not sure the appointment was made Urgent referral Dr. Ruthe Mannan office for this week

## 2019-05-15 ENCOUNTER — Other Ambulatory Visit: Payer: Self-pay | Admitting: *Deleted

## 2019-05-15 DIAGNOSIS — L03119 Cellulitis of unspecified part of limb: Secondary | ICD-10-CM

## 2019-05-15 NOTE — Telephone Encounter (Signed)
Referral put in as urgent and this message sent to referral coordinator

## 2019-05-15 NOTE — Telephone Encounter (Signed)
Urgent referral sent to Dr. Ruthe Mannan office, they'll contact pt to schedule

## 2019-05-17 ENCOUNTER — Telehealth: Payer: Self-pay | Admitting: *Deleted

## 2019-05-17 DIAGNOSIS — L03119 Cellulitis of unspecified part of limb: Secondary | ICD-10-CM

## 2019-05-17 NOTE — Telephone Encounter (Signed)
I received this referral message today. Copied message below:  Note   ----- Message ----- From: Cyndie Chime Sent: 05/17/2019  10:21 AM EST To: [facility] Pool Subject: FW: URGENT REFERRAL                            Please notify pt of note below from Dr. Ruthe Mannan office   ----- Message ----- From: May, Nasiyah Laverdiere, RT Sent: 05/16/2019   2:16 PM EST To: Cyndie Chime Subject: URGENT REFERRAL                                I have spoken with Dr Aline Brochure and he recommends that the patient go to the ED for further treatment, IV abx, etc.  He says he could see him in office, but he would send him to the ED, so he recommends going there vs here.  Please let me know if you have any further questions.  Thank you-      I called pt to see how he was doing and to tell him what dr harrison's office said and had to leave a message for him to call back.

## 2019-05-17 NOTE — Telephone Encounter (Signed)
If it is gradually getting better I would recommend continuing the antibiotics If it is swelling more or losing range of motion of the elbow I recommend ER I also believe patient should consider referral to [facility] There should be a orthopedic urgent care that could see him sooner He needs to be seen this week regardless

## 2019-05-17 NOTE — Telephone Encounter (Signed)
Pt states dr Aline Brochure did call yesterday and was having trying to find an appt for him and then was told they would call him back and they have not. See note below of message we got from dr harrison's office. I told pt what it said for him to go to ED. Pt wanted to know what dr scott thought.  still a little bit sore. Not quite as bad as when he was seen.can tell its getting better. Has been taking antibiotics since Friday. No fever.

## 2019-05-17 NOTE — Telephone Encounter (Signed)
Discussed with pt and urgent referral put in and discussed with referral coordinator.

## 2019-06-09 ENCOUNTER — Other Ambulatory Visit: Payer: Self-pay | Admitting: Family Medicine

## 2019-06-23 ENCOUNTER — Other Ambulatory Visit: Payer: Self-pay | Admitting: Family Medicine

## 2019-07-05 ENCOUNTER — Other Ambulatory Visit: Payer: Self-pay | Admitting: Family Medicine

## 2019-07-05 NOTE — Telephone Encounter (Signed)
Last med check up 02/22/19

## 2019-09-18 ENCOUNTER — Other Ambulatory Visit: Payer: Self-pay | Admitting: Family Medicine

## 2019-09-18 NOTE — Telephone Encounter (Signed)
May have 90-day needs follow-up within the next 3 months

## 2019-09-20 ENCOUNTER — Ambulatory Visit (INDEPENDENT_AMBULATORY_CARE_PROVIDER_SITE_OTHER): Payer: Managed Care, Other (non HMO) | Admitting: Urology

## 2019-09-20 ENCOUNTER — Other Ambulatory Visit: Payer: Self-pay

## 2019-09-20 ENCOUNTER — Encounter: Payer: Self-pay | Admitting: Urology

## 2019-09-20 VITALS — BP 122/86 | HR 77 | Temp 97.9°F | Ht 67.0 in | Wt 225.0 lb

## 2019-09-20 DIAGNOSIS — E291 Testicular hypofunction: Secondary | ICD-10-CM | POA: Diagnosis not present

## 2019-09-20 NOTE — Patient Instructions (Signed)
Testosterone topical solution What is this medicine? TESTOSTERONE (tes TOS ter one) is the main male hormone. It supports normal male traits such as muscle growth, facial hair, and deep voice. This medicine is used in males to treat low testosterone levels. This medicine may be used for other purposes; ask your health care provider or pharmacist if you have questions. COMMON BRAND NAME(S): AXIRON What should I tell my health care provider before I take this medicine? They need to know if you have any of these conditions:  breast cancer  breathing problems while you sleep (sleep apnea)  diabetes  heart disease  if a male partner is pregnant or trying to get pregnant  kidney disease  liver disease  lung disease  prostate cancer, enlargement  an unusual or allergic reaction to testosterone, other medicines, foods, dyes, or preservatives  pregnant or trying to get pregnant  breast-feeding How should I use this medicine? This medicine is applied at the same time every day (preferably in the morning) to clean, dry, intact skin of the armpit. Follow the directions on the prescription label. If you take a bath or shower in the morning, apply the medicine after the bath or shower. If you use deodorants or antiperspirants, use them at least 2 minutes before applying this medicine. Only apply this medicine to the armpits. Do not use on any other body part. To use, remove the cap and the applicator cup from the pump. Fully depress the pump once to dispense solution into the applicator cup. With the applicator cup held upright, wipe the solution down and up into the armpit. Do not use your fingers or hand to rub the medicine into the skin. Allow the skin to dry a few minutes before putting on clothing. The skin solution is flammable. Avoid fire, flame, or smoking until the solution has dried. Wash your hands after use. Avoid bathing or swimming for at least 2 hours after you apply the  medicine. A special MedGuide will be given to you by the pharmacist with each prescription and refill. Be sure to read this information carefully each time. Talk to your pediatrician regarding the use of this medicine in children. Special care may be needed. Overdosage: If you think you have taken too much of this medicine contact a poison control center or emergency room at once. NOTE: This medicine is only for you. Do not share this medicine with others. What if I miss a dose? If you miss a dose, use it as soon as you can. If it is almost time for your next dose, use only that dose. Do not use double or extra doses. What may interact with this medicine?  certain medicines for diabetes  certain medicines that treat or prevent blood clots like warfarin  oxyphenbutazone  propranolol  steroid medicines like prednisone or cortisone This list may not describe all possible interactions. Give your health care provider a list of all the medicines, herbs, non-prescription drugs, or dietary supplements you use. Also tell them if you smoke, drink alcohol, or use illegal drugs. Some items may interact with your medicine. What should I watch for while using this medicine? Visit your doctor or health care professional for regular checks on your progress. They will need to check the level of testosterone in your blood. This medicine is only approved for use in men who have low levels of testosterone related to certain medical conditions. Heart attacks and strokes have been reported with the use of this medicine. Notify  your doctor or health care professional and seek emergency treatment if you develop breathing problems; changes in vision; confusion; chest pain or chest tightness; sudden arm pain; severe, sudden headache; trouble speaking or understanding; sudden numbness or weakness of the face, arm or leg; loss of balance or coordination. Talk to your doctor about the risks and benefits of this  medicine. This medicine can transfer from your body to others. If a person or pet comes in contact with the area where this medicine was applied to your skin, they may have a serious risk of side effects. If you cannot avoid skin-to-skin contact with another person, make sure the site where this medicine was applied is covered with clothing. If accidental contact happens, the skin of the person or pet should be washed right away with soap and water. Also, a male partner who is pregnant or trying to get pregnant should avoid contact with the gel or treated skin. This medicine may affect blood sugar levels. If you have diabetes, check with your doctor or health care professional before you change your diet or the dose of your diabetic medicine. This drug is banned from use in athletes by most athletic organizations. What side effects may I notice from receiving this medicine? Side effects that you should report to your doctor or health care professional as soon as possible:  allergic reactions like skin rash, itching or hives, swelling of the face, lips, or tongue  breast enlargement  breathing problems  changes in emotions or moods, especially anger, depression, or rage  dark urine  general ill feeling or flu-like symptoms  light-colored stools  loss of appetite, nausea  nausea, vomiting  pain, swelling, warmth in the leg  right upper belly pain  stomach pain  swelling of the ankles, feet, hands  too frequent or persistent erections  trouble passing urine or change in the amount of urine  unusually weak or tired  yellowing of the eyes or skin Side effects that usually do not require medical attention (report to your doctor or health care professional if they continue or are bothersome):  acne  change in sex drive or performance  diarrhea  hair loss  headache This list may not describe all possible side effects. Call your doctor for medical advice about side  effects. You may report side effects to FDA at 1-800-FDA-1088. Where should I keep my medicine? Keep out of the reach of children. This medicine can be abused. Keep your medicine in a safe place to protect it from theft. Do not share this medicine with anyone. Selling or giving away this medicine is dangerous and against the law. Store upright at room temperature between 15 to 30 degrees C (59 to 86 degrees F). Keep closed until use. Protect from heat and light. This medicine is flammable. Avoid exposure to heat, fire, flame, and smoking. Throw away any unused medicine after the expiration date. NOTE: This sheet is a summary. It may not cover all possible information. If you have questions about this medicine, talk to your doctor, pharmacist, or health care provider.  2020 Elsevier/Gold Standard (2015-08-16 16:38:08)

## 2019-09-20 NOTE — Progress Notes (Signed)
Urological Symptom Review  Patient is experiencing the following symptoms: none   Review of Systems  Gastrointestinal (upper)  : Negative for upper GI symptoms  Gastrointestinal (lower) : Negative for lower GI symptoms  Constitutional : Fatigue  Skin: Negative for skin symptoms  Eyes: Negative for eye symptoms  Ear/Nose/Throat : Negative for Ear/Nose/Throat symptoms  Hematologic/Lymphatic: Negative for Hematologic/Lymphatic symptoms  Cardiovascular : Negative for cardiovascular symptoms  Respiratory : Negative for respiratory symptoms  Endocrine: Negative for endocrine symptoms  Musculoskeletal: Back Pain  Neurological: Negative for neurological symptoms  Psychologic: Negative for psychiatric symptoms

## 2019-09-20 NOTE — Progress Notes (Signed)
09/20/2019 1:43 PM   Bryan Gilbert 11/15/1965 [id number]  Referring provider: Kathyrn Drown, MD [address] [place],  [place] [address]  fatigue  HPI: Bryan Gilbert is a 54yo here for followup for hypogonadism. He was previously seen by Dr. Karsten Ro and is currently on androgel 2 pumps daily. No recent labs. He has fatigue. 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   PMH: Past Medical History:  Diagnosis Date  . Abdominal migraine   . Cervical neuralgia   . Cyclical vomiting   . Elevated hemoglobin ([place])   . Hypertension   . Sleep apnea    states uses O2 and CPAP at night  . Slipped intervertebral disc     Surgical History: Past Surgical History:  Procedure Laterality Date  . ESOPHAGOGASTRODUODENOSCOPY      Home Medications:  Allergies as of 09/20/2019      Reactions   Cefprozil Diarrhea, Nausea Only   Intestinal upset and diarrhea   Lipitor [atorvastatin] Other (See Comments)   Myalgia      Medication List       Accurate as of September 20, 2019  1:43 PM. If you have any questions, ask your nurse or doctor.        STOP taking these medications   amoxicillin-clavulanate 875-125 MG tablet Commonly known as: AUGMENTIN Stopped by: Nicolette Bang, MD   Belbuca 300 MCG Film Generic drug: Buprenorphine HCl Stopped by: Nicolette Bang, MD   fluticasone 50 MCG/ACT nasal spray Commonly known as: Flonase Stopped by: Nicolette Bang, MD   ondansetron 8 MG tablet Commonly known as: ZOFRAN Stopped by: Nicolette Bang, MD   sulfamethoxazole-trimethoprim 800-160 MG tablet Commonly known as: BACTRIM DS Stopped by: Nicolette Bang, MD     TAKE these medications   butorphanol 10 MG/ML nasal spray Commonly known as: STADOL Place into the nose.   cyclobenzaprine 10 MG tablet Commonly known as: FLEXERIL cyclobenzaprine 10 mg tablet  TAKE 1 TABLET BY MOUTH THREE TIMES A DAY AS NEEDED   HYDROcodone-acetaminophen 10-325 MG tablet Commonly known as: NORCO  Take one every 4 hours as needed for pain. One up to 6 times daily.   levorphanol 2 MG tablet Commonly known as: LEVODROMORAN Take 3 mg by mouth 3 (three) times daily.   lisinopril-hydrochlorothiazide 10-12.5 MG tablet Commonly known as: ZESTORETIC TAKE 1 TABLET BY MOUTH EVERY DAY   loperamide 2 MG capsule Commonly known as: IMODIUM Take 2 mg by mouth as needed for diarrhea or loose stools.   meloxicam 15 MG tablet Commonly known as: MOBIC TAKE 1 TABLET BY MOUTH EVERY DAY   SALINE NASAL SPRAY NA Place 1 spray into the nose daily as needed (dryness). Reported on 09/17/2015   sildenafil 100 MG tablet Commonly known as: VIAGRA TAKE ONE HALF TO ONE WHOLE TABLET ONE HOUR BEFORE RELATIONS PRN   simethicone 80 MG chewable tablet Commonly known as: MYLICON Chew 80 mg by mouth every 6 (six) hours as needed for flatulence.   Testosterone 20.25 MG/ACT (1.62%) Gel testosterone 20.25 mg/1.25 gram (1.62 %) transdermal gel pump  APPLY 2 PUMPS ONTO THE SKIN IN THE MORNING   UNABLE TO FIND Testosterone gel   Xtampza ER 9 MG C12a Generic drug: oxyCODONE ER Take 1 capsule by mouth 2 (two) times daily.       Allergies:  Allergies  Allergen Reactions  . Cefprozil Diarrhea and Nausea Only    Intestinal upset and diarrhea  . Lipitor [Atorvastatin] Other (See Comments)  Myalgia    Family History: Family History  Problem Relation Age of Onset  . Heart failure Father   . Hypertension Father     Social History:  reports that he has never smoked. He has never used smokeless tobacco. He reports that he does not drink alcohol or use drugs.  ROS: All other review of systems were reviewed and are negative except what is noted above in HPI  Physical Exam: BP 122/86   Pulse 77   Temp 97.9 F (36.6 C)   Ht 5\' 7"  (1.702 m)   Wt 225 lb (102.1 kg)   BMI 35.24 kg/m   Constitutional:  Alert and oriented, No acute distress. HEENT: [place] AT, moist mucus membranes.  Trachea midline, no  masses. Cardiovascular: No clubbing, cyanosis, or edema. Respiratory: Normal respiratory effort, no increased work of breathing. GI: Abdomen is soft, nontender, nondistended, no abdominal masses GU: No CVA tenderness Lymph: No cervical or inguinal lymphadenopathy. Skin: No rashes, bruises or suspicious lesions. Neurologic: Grossly intact, no focal deficits, moving all 4 extremities. Psychiatric: Normal mood and affect.  Laboratory Data: Lab Results  Component Value Date   WBC 11.6 (H) 10/05/2015   HGB 15.7 10/05/2015   HCT 44.7 10/05/2015   MCV 85.6 10/05/2015   PLT 409 (H) 10/05/2015    Lab Results  Component Value Date   CREATININE 0.87 01/31/2019    No results found for: PSA  Lab Results  Component Value Date   TESTOSTERONE 61 (L) 01/31/2019    Lab Results  Component Value Date   HGBA1C 5.7 (H) 01/31/2019    Urinalysis    Component Value Date/Time   COLORURINE YELLOW 10/05/2015 1012   APPEARANCEUR HAZY (A) 10/05/2015 1012   LABSPEC 1.025 10/05/2015 1012   PHURINE 6.0 10/05/2015 1012   GLUCOSEU NEGATIVE 10/05/2015 1012   HGBUR NEGATIVE 10/05/2015 1012   BILIRUBINUR NEGATIVE 10/05/2015 1012   KETONESUR NEGATIVE 10/05/2015 1012   PROTEINUR NEGATIVE 10/05/2015 1012   UROBILINOGEN 0.2 05/19/2013 1732   NITRITE NEGATIVE 10/05/2015 1012   LEUKOCYTESUR NEGATIVE 10/05/2015 1012    Lab Results  Component Value Date   BACTERIA MANY (A) 09/19/2015    Pertinent Imaging:  No results found for this or any previous visit. No results found for this or any previous visit. No results found for this or any previous visit. No results found for this or any previous visit. No results found for this or any previous visit. No results found for this or any previous visit. No results found for this or any previous visit. No results found for this or any previous visit.  Assessment & Plan:    1. Male hypogonadism -testosterone labs, will call with results -We will  adjust androgel based on labs -RTC 3 months with labs   No follow-ups on file.  Nicolette Bang, MD  [facility] [place]

## 2019-10-31 ENCOUNTER — Other Ambulatory Visit: Payer: Self-pay | Admitting: Family Medicine

## 2019-12-13 ENCOUNTER — Other Ambulatory Visit: Payer: Self-pay | Admitting: Family Medicine

## 2019-12-25 ENCOUNTER — Ambulatory Visit: Payer: Managed Care, Other (non HMO) | Admitting: Urology

## 2019-12-27 ENCOUNTER — Other Ambulatory Visit: Payer: Self-pay | Admitting: Family Medicine

## 2019-12-27 NOTE — Telephone Encounter (Signed)
Please contact patient to set up appt; then may route back to nurses. Thank you  

## 2019-12-27 NOTE — Telephone Encounter (Signed)
lvm to schedule appt.  

## 2019-12-28 NOTE — Telephone Encounter (Signed)
Pt made appt for 7/21

## 2019-12-28 NOTE — Telephone Encounter (Signed)
lvm to schedule appt.  

## 2020-01-01 ENCOUNTER — Encounter: Payer: Self-pay | Admitting: Urology

## 2020-01-01 ENCOUNTER — Other Ambulatory Visit: Payer: Self-pay

## 2020-01-01 ENCOUNTER — Ambulatory Visit (INDEPENDENT_AMBULATORY_CARE_PROVIDER_SITE_OTHER): Payer: Managed Care, Other (non HMO) | Admitting: Urology

## 2020-01-01 VITALS — BP 139/93 | HR 90 | Temp 97.7°F

## 2020-01-01 DIAGNOSIS — E291 Testicular hypofunction: Secondary | ICD-10-CM

## 2020-01-01 MED ORDER — TESTOSTERONE 20.25 MG/ACT (1.62%) TD GEL: TRANSDERMAL | 5 refills | Status: DC

## 2020-01-01 NOTE — Progress Notes (Signed)
01/01/2020 1:47 PM   Bryan Gilbert 1965/07/19 [id number]  Referring provider: Kathyrn Drown, MD [address] [place],  [place] [address]  fatigue  HPI: Bryan Gilbert is a 54yo here for followup for hypogonadism. He uses andogel 1.62% 2 pumps daily. No recent labs. He is fatigued and is having another sleep study. He uses sildenafil for ED but has not used it in 3 months. Good libido   PMH: Past Medical History:  Diagnosis Date  . Abdominal migraine   . Cervical neuralgia   . Cyclical vomiting   . Elevated hemoglobin ([place])   . Hypertension   . Sleep apnea    states uses O2 and CPAP at night  . Slipped intervertebral disc     Surgical History: Past Surgical History:  Procedure Laterality Date  . ESOPHAGOGASTRODUODENOSCOPY      Home Medications:  Allergies as of 01/01/2020      Reactions   Cefprozil Diarrhea, Nausea Only   Intestinal upset and diarrhea   Lipitor [atorvastatin] Other (See Comments)   Myalgia      Medication List       Accurate as of January 01, 2020  1:47 PM. If you have any questions, ask your nurse or doctor.        butorphanol 10 MG/ML nasal spray Commonly known as: STADOL Place into the nose.   cyclobenzaprine 10 MG tablet Commonly known as: FLEXERIL cyclobenzaprine 10 mg tablet  TAKE 1 TABLET BY MOUTH THREE TIMES A DAY AS NEEDED   HYDROcodone-acetaminophen 10-325 MG tablet Commonly known as: NORCO Take one every 4 hours as needed for pain. One up to 6 times daily.   levorphanol 2 MG tablet Commonly known as: LEVODROMORAN Take 3 mg by mouth 3 (three) times daily.   lisinopril-hydrochlorothiazide 10-12.5 MG tablet Commonly known as: ZESTORETIC TAKE 1 TABLET BY MOUTH EVERY DAY   loperamide 2 MG capsule Commonly known as: IMODIUM Take 2 mg by mouth as needed for diarrhea or loose stools.   meloxicam 15 MG tablet Commonly known as: MOBIC TAKE 1 TABLET BY MOUTH EVERY DAY   SALINE NASAL SPRAY NA Place 1 spray into the nose  daily as needed (dryness). Reported on 09/17/2015   sildenafil 100 MG tablet Commonly known as: VIAGRA TAKE ONE HALF TO ONE WHOLE TABLET ONE HOUR BEFORE RELATIONS PRN   simethicone 80 MG chewable tablet Commonly known as: MYLICON Chew 80 mg by mouth every 6 (six) hours as needed for flatulence.   Testosterone 20.25 MG/ACT (1.62%) Gel testosterone 20.25 mg/1.25 gram (1.62 %) transdermal gel pump  APPLY 2 PUMPS ONTO THE SKIN IN THE MORNING   UNABLE TO FIND Testosterone gel   Xtampza ER 9 MG C12a Generic drug: oxyCODONE ER Take 1 capsule by mouth 2 (two) times daily.       Allergies:  Allergies  Allergen Reactions  . Cefprozil Diarrhea and Nausea Only    Intestinal upset and diarrhea  . Lipitor [Atorvastatin] Other (See Comments)    Myalgia    Family History: Family History  Problem Relation Age of Onset  . Heart failure Father   . Hypertension Father     Social History:  reports that he has never smoked. He has never used smokeless tobacco. He reports that he does not drink alcohol and does not use drugs.  ROS: All other review of systems were reviewed and are negative except what is noted above in HPI  Physical Exam: BP (!) 139/93  Pulse 90   Temp 97.7 F (36.5 C)   Constitutional:  Alert and oriented, No acute distress. HEENT: [place] AT, moist mucus membranes.  Trachea midline, no masses. Cardiovascular: No clubbing, cyanosis, or edema. Respiratory: Normal respiratory effort, no increased work of breathing. GI: Abdomen is soft, nontender, nondistended, no abdominal masses GU: No CVA tenderness.  Lymph: No cervical or inguinal lymphadenopathy. Skin: No rashes, bruises or suspicious lesions. Neurologic: Grossly intact, no focal deficits, moving all 4 extremities. Psychiatric: Normal mood and affect.  Laboratory Data: Lab Results  Component Value Date   WBC 11.6 (H) 10/05/2015   HGB 15.7 10/05/2015   HCT 44.7 10/05/2015   MCV 85.6 10/05/2015   PLT 409 (H)  10/05/2015    Lab Results  Component Value Date   CREATININE 0.87 01/31/2019    No results found for: PSA  Lab Results  Component Value Date   TESTOSTERONE 61 (L) 01/31/2019    Lab Results  Component Value Date   HGBA1C 5.7 (H) 01/31/2019    Urinalysis    Component Value Date/Time   COLORURINE YELLOW 10/05/2015 1012   APPEARANCEUR HAZY (A) 10/05/2015 1012   LABSPEC 1.025 10/05/2015 1012   PHURINE 6.0 10/05/2015 1012   GLUCOSEU NEGATIVE 10/05/2015 1012   HGBUR NEGATIVE 10/05/2015 1012   BILIRUBINUR NEGATIVE 10/05/2015 1012   KETONESUR NEGATIVE 10/05/2015 1012   PROTEINUR NEGATIVE 10/05/2015 1012   UROBILINOGEN 0.2 05/19/2013 1732   NITRITE NEGATIVE 10/05/2015 1012   LEUKOCYTESUR NEGATIVE 10/05/2015 1012    Lab Results  Component Value Date   BACTERIA MANY (A) 09/19/2015    Pertinent Imaging:  No results found for this or any previous visit.  No results found for this or any previous visit.  No results found for this or any previous visit.  No results found for this or any previous visit.  No results found for this or any previous visit.  No results found for this or any previous visit.  No results found for this or any previous visit.  No results found for this or any previous visit.   Assessment & Plan:    1. Male hypogonadism -testosterone labs today, if stable RTC 6 months with labs -continue androgel 2 pumps daily   No follow-ups on file.  Nicolette Bang, MD  [facility] [place]

## 2020-01-01 NOTE — Patient Instructions (Signed)
Testosterone skin gel What is this medicine? TESTOSTERONE (tes TOS ter one) is the main male hormone. It supports normal male traits such as muscle growth, facial hair, and deep voice. This gel is used in males to treat low testosterone levels. This medicine may be used for other purposes; ask your health care provider or pharmacist if you have questions. COMMON BRAND NAME(S): AndroGel, FORTESTA, Testim, Vogelxo What should I tell my health care provider before I take this medicine? They need to know if you have any of these conditions:  breast cancer  diabetes  heart disease  if a male partner is pregnant or trying to get pregnant  kidney disease  liver disease  lung disease  prostate cancer, enlargement  an unusual or allergic reaction to testosterone, soy proteins, other medicines, foods, dyes, or preservatives  pregnant or trying to get pregnant  breast-feeding How should I use this medicine? This medicine is for external use only. This medicine is applied at the same time every day (preferably in the morning) to clean, dry, intact skin. If you take a bath or shower in the morning, apply the gel after the bath or shower. Follow the directions on the prescription label. Make sure that you are using your testosterone gel product correctly and applying it only to the appropriate skin area (see below). Allow the skin to dry a few minutes then cover with clothing to prevent others from coming in contact with the medicine on your skin. The gel is flammable. Avoid fire, flame, or smoking until the gel has dried. Wash your hands with soap and water after use. For AndroGel 1% Packets: Open the packet(s) needed for your dose. You can put the entire dose into your palm all at once or just a little at a time to apply. If you prefer, you can instead squeeze the gel directly onto the area you are applying it to. Apply on the shoulders, upper arm, or abdomen as directed. Do not apply to the  scrotum or genitals. Be sure you use the correct total dose. It is best to wait 5 to 6 hours after application of the gel before showering or swimming. For AndroGel 1%: Pump the dose into the palm of your hand. You can put the entire dose into your palm all at once or just a little at a time to apply. If you prefer, you can instead pump the gel directly onto the area you are applying it to. Apply on the shoulders, upper arm, or abdomen as directed. Do not apply to the scrotum or genitals. Be sure you use the correct total dose. It is best to wait for 5 to 6 hours after application of the gel before showering or swimming. For Androgel 1.62% packets: Open the packet(s) needed for your dose. You can put the entire dose into your palm all at once or just a little at a time to apply. If you prefer, you can instead squeeze the gel directly onto the area you are applying it to. Apply on the shoulders and upper arms as directed. Do not apply to other parts of the body including the abdomen, genitals, chest, armpits, or knees. Be sure you use the correct total dose. It is best to wait 2 hours after application of the gel before washing, showering, or swimming. For AndroGel 1.62%: Pump the dose into the palm of your hand. Dispense one pump of gel at a time into the palm of your hand before applying it. If you  prefer, you can instead pump the gel directly onto the area you are applying it to. Apply on the shoulders and upper arms as directed. Do not apply to other parts of the body including the abdomen, genitals, chest, armpits, or knees. Be sure you use the correct total dose. It is best to wait 2 hours after application of the gel before washing, showering, or swimming. For Testim: Open the tube(s) needed for your dose. Squeeze the gel from the tube into the palm of your hand. Apply on the shoulders or upper arms as directed. Do not apply to the scrotum, genitals, or abdomen. Be sure you use the correct total dose. Do  not shower or swim for at least 2 hours after application of the gel. For Fortesta: Use the multi-dose pump to pump the gel directly onto the area you are applying it to. Apply on the thighs as directed. Do not apply to the abdomen, penis, scrotum, shoulders or upper arms. Gently rub the gel onto the skin using your finger. Be sure you use the correct total dose. Do not shower or swim for at least 2 hours after application of the gel. A special MedGuide will be given to you by the pharmacist with each prescription and refill. Be sure to read this information carefully each time. Talk to your pediatrician regarding the use of this medicine in children. Special care may be needed. Overdosage: If you think you have taken too much of this medicine contact a poison control center or emergency room at once. NOTE: This medicine is only for you. Do not share this medicine with others. What if I miss a dose? If you miss a dose, use it as soon as you can. If it is almost time for your next dose, use only that dose. Do not use double or extra doses. What may interact with this medicine?  medicines for diabetes  medicines that treat or prevent blood clots like warfarin  oxyphenbutazone  propranolol  steroid medicines like prednisone or cortisone This list may not describe all possible interactions. Give your health care provider a list of all the medicines, herbs, non-prescription drugs, or dietary supplements you use. Also tell them if you smoke, drink alcohol, or use illegal drugs. Some items may interact with your medicine. What should I watch for while using this medicine? Visit your doctor or health care professional for regular checks on your progress. They will need to check the level of testosterone in your blood. This medicine is only approved for use in men who have low levels of testosterone related to certain medical conditions. Heart attacks and strokes have been reported with the use of this  medicine. Notify your doctor or health care professional and seek emergency treatment if you develop breathing problems; changes in vision; confusion; chest pain or chest tightness; sudden arm pain; severe, sudden headache; trouble speaking or understanding; sudden numbness or weakness of the face, arm or leg; loss of balance or coordination. Talk to your doctor about the risks and benefits of this medicine. This medicine can transfer from your body to others. If a person or pet comes in contact with the area where this medicine was applied to your skin, they may have a serious risk of side effects. If you cannot avoid skin-to-skin contact with another person, make sure the site where this medicine was applied is covered with clothing. If accidental contact happens, the skin of the person or pet should be washed right away with soap  and water. Also, a male partner who is pregnant or trying to get pregnant should avoid contact with the gel or treated skin. This medicine may affect blood sugar levels. If you have diabetes, check with your doctor or health care professional before you change your diet or the dose of your diabetic medicine. This drug is banned from use in athletes by most athletic organizations. What side effects may I notice from receiving this medicine? Side effects that you should report to your doctor or health care professional as soon as possible:  allergic reactions like skin rash, itching or hives, swelling of the face, lips, or tongue  breast enlargement  breathing problems  changes in mood, especially anger, depression, or rage  dark urine  general ill feeling or flu-like symptoms  light-colored stools  loss of appetite, nausea  nausea, vomiting  right upper belly pain  stomach pain  swelling of ankles  too frequent or persistent erections  trouble passing urine or change in the amount of urine  unusually weak or tired  yellowing of the eyes or skin Side  effects that usually do not require medical attention (report to your doctor or health care professional if they continue or are bothersome):  acne  change in sex drive or performance  hair loss  headache This list may not describe all possible side effects. Call your doctor for medical advice about side effects. You may report side effects to FDA at 1-800-FDA-1088. Where should I keep my medicine? Keep out of the reach of children. This medicine can be abused. Keep your medicine in a safe place to protect it from theft. Do not share this medicine with anyone. Selling or giving away this medicine is dangerous and against the law. Store at room temperature between 15 to 30 degrees C (59 to 86 degrees F). Keep closed until use. Protect from heat and light. This medicine is flammable. Avoid exposure to heat, fire, flame, and smoking. Throw away any unused medicine after the expiration date. NOTE: This sheet is a summary. It may not cover all possible information. If you have questions about this medicine, talk to your doctor, pharmacist, or health care provider.  2020 Elsevier/Gold Standard (2013-09-07 08:27:26)

## 2020-01-01 NOTE — Progress Notes (Signed)
Urological Symptom Review  Patient is experiencing the following symptoms: none   Review of Systems  Gastrointestinal (upper)  : Negative for upper GI symptoms  Gastrointestinal (lower) : Negative for lower GI symptoms  Constitutional : Negative for symptoms  Skin: Negative for skin symptoms  Eyes: Negative for eye symptoms  Ear/Nose/Throat : Sinus problems  Hematologic/Lymphatic: Negative for Hematologic/Lymphatic symptoms  Cardiovascular : Negative for cardiovascular symptoms  Respiratory : Negative for respiratory symptoms  Endocrine: Negative for endocrine symptoms  Musculoskeletal: Back pain  Neurological: Headaches  Psychologic: Negative for psychiatric symptoms

## 2020-01-24 ENCOUNTER — Other Ambulatory Visit: Payer: Self-pay

## 2020-01-24 ENCOUNTER — Ambulatory Visit (INDEPENDENT_AMBULATORY_CARE_PROVIDER_SITE_OTHER): Payer: Managed Care, Other (non HMO) | Admitting: Family Medicine

## 2020-01-24 ENCOUNTER — Encounter: Payer: Self-pay | Admitting: Family Medicine

## 2020-01-24 VITALS — BP 128/84 | Temp 97.1°F | Wt 225.2 lb

## 2020-01-24 DIAGNOSIS — G4733 Obstructive sleep apnea (adult) (pediatric): Secondary | ICD-10-CM

## 2020-01-24 DIAGNOSIS — E7849 Other hyperlipidemia: Secondary | ICD-10-CM

## 2020-01-24 DIAGNOSIS — R7303 Prediabetes: Secondary | ICD-10-CM

## 2020-01-24 DIAGNOSIS — Z125 Encounter for screening for malignant neoplasm of prostate: Secondary | ICD-10-CM

## 2020-01-24 DIAGNOSIS — I10 Essential (primary) hypertension: Secondary | ICD-10-CM | POA: Diagnosis not present

## 2020-01-24 MED ORDER — AMOXICILLIN-POT CLAVULANATE 875-125 MG PO TABS: 1.0000 | ORAL_TABLET | Freq: Two times a day (BID) | ORAL | 0 refills | Status: DC

## 2020-01-24 MED ORDER — MELOXICAM 15 MG PO TABS: 15.0000 mg | ORAL_TABLET | Freq: Every day | ORAL | 1 refills | Status: DC

## 2020-01-24 MED ORDER — LISINOPRIL-HYDROCHLOROTHIAZIDE 10-12.5 MG PO TABS: 1.0000 | ORAL_TABLET | Freq: Every day | ORAL | 1 refills | Status: DC

## 2020-01-24 NOTE — Patient Instructions (Signed)
DASH Eating Plan DASH stands for "Dietary Approaches to Stop Hypertension." The DASH eating plan is a healthy eating plan that has been shown to reduce high blood pressure (hypertension). It may also reduce your risk for type 2 diabetes, heart disease, and stroke. The DASH eating plan may also help with weight loss. What are tips for following this plan?  General guidelines  Avoid eating more than 2,300 mg (milligrams) of salt (sodium) a day. If you have hypertension, you may need to reduce your sodium intake to 1,500 mg a day.  Limit alcohol intake to no more than 1 drink a day for nonpregnant women and 2 drinks a day for men. One drink equals 12 oz of beer, 5 oz of wine, or 1 oz of hard liquor.  Work with your health care provider to maintain a healthy body weight or to lose weight. Ask what an ideal weight is for you.  Get at least 30 minutes of exercise that causes your heart to beat faster (aerobic exercise) most days of the week. Activities may include walking, swimming, or biking.  Work with your health care provider or diet and nutrition specialist (dietitian) to adjust your eating plan to your individual calorie needs. Reading food labels   Check food labels for the amount of sodium per serving. Choose foods with less than 5 percent of the Daily Value of sodium. Generally, foods with less than 300 mg of sodium per serving fit into this eating plan.  To find whole grains, look for the word "whole" as the first word in the ingredient list. Shopping  Buy products labeled as "low-sodium" or "no salt added."  Buy fresh foods. Avoid canned foods and premade or frozen meals. Cooking  Avoid adding salt when cooking. Use salt-free seasonings or herbs instead of table salt or sea salt. Check with your health care provider or pharmacist before using salt substitutes.  Do not fry foods. Cook foods using healthy methods such as baking, boiling, grilling, and broiling instead.  Cook with  heart-healthy oils, such as olive, canola, soybean, or sunflower oil. Meal planning  Eat a balanced diet that includes: ? 5 or more servings of fruits and vegetables each day. At each meal, try to fill half of your plate with fruits and vegetables. ? Up to 6-8 servings of whole grains each day. ? Less than 6 oz of lean meat, poultry, or fish each day. A 3-oz serving of meat is about the same size as a deck of cards. One egg equals 1 oz. ? 2 servings of low-fat dairy each day. ? A serving of nuts, seeds, or beans 5 times each week. ? Heart-healthy fats. Healthy fats called Omega-3 fatty acids are found in foods such as flaxseeds and coldwater fish, like sardines, salmon, and mackerel.  Limit how much you eat of the following: ? Canned or prepackaged foods. ? Food that is high in trans fat, such as fried foods. ? Food that is high in saturated fat, such as fatty meat. ? Sweets, desserts, sugary drinks, and other foods with added sugar. ? Full-fat dairy products.  Do not salt foods before eating.  Try to eat at least 2 vegetarian meals each week.  Eat more home-cooked food and less restaurant, buffet, and fast food.  When eating at a restaurant, ask that your food be prepared with less salt or no salt, if possible. What foods are recommended? The items listed may not be a complete list. Talk with your dietitian about   what dietary choices are best for you. Grains Whole-grain or whole-wheat bread. Whole-grain or whole-wheat pasta. Brown rice. Oatmeal. Quinoa. Bulgur. Whole-grain and low-sodium cereals. Pita bread. Low-fat, low-sodium crackers. Whole-wheat flour tortillas. Vegetables Fresh or frozen vegetables (raw, steamed, roasted, or grilled). Low-sodium or reduced-sodium tomato and vegetable juice. Low-sodium or reduced-sodium tomato sauce and tomato paste. Low-sodium or reduced-sodium canned vegetables. Fruits All fresh, dried, or frozen fruit. Canned fruit in natural juice (without  added sugar). Meat and other protein foods Skinless chicken or turkey. Ground chicken or turkey. Pork with fat trimmed off. Fish and seafood. Egg whites. Dried beans, peas, or lentils. Unsalted nuts, nut butters, and seeds. Unsalted canned beans. Lean cuts of beef with fat trimmed off. Low-sodium, lean deli meat. Dairy Low-fat (1%) or fat-free (skim) milk. Fat-free, low-fat, or reduced-fat cheeses. Nonfat, low-sodium ricotta or cottage cheese. Low-fat or nonfat yogurt. Low-fat, low-sodium cheese. Fats and oils Soft margarine without trans fats. Vegetable oil. Low-fat, reduced-fat, or light mayonnaise and salad dressings (reduced-sodium). Canola, safflower, olive, soybean, and sunflower oils. Avocado. Seasoning and other foods Herbs. Spices. Seasoning mixes without salt. Unsalted popcorn and pretzels. Fat-free sweets. What foods are not recommended? The items listed may not be a complete list. Talk with your dietitian about what dietary choices are best for you. Grains Baked goods made with fat, such as croissants, muffins, or some breads. Dry pasta or rice meal packs. Vegetables Creamed or fried vegetables. Vegetables in a cheese sauce. Regular canned vegetables (not low-sodium or reduced-sodium). Regular canned tomato sauce and paste (not low-sodium or reduced-sodium). Regular tomato and vegetable juice (not low-sodium or reduced-sodium). Pickles. Olives. Fruits Canned fruit in a light or heavy syrup. Fried fruit. Fruit in cream or butter sauce. Meat and other protein foods Fatty cuts of meat. Ribs. Fried meat. Bacon. Sausage. Bologna and other processed lunch meats. Salami. Fatback. Hotdogs. Bratwurst. Salted nuts and seeds. Canned beans with added salt. Canned or smoked fish. Whole eggs or egg yolks. Chicken or turkey with skin. Dairy Whole or 2% milk, cream, and half-and-half. Whole or full-fat cream cheese. Whole-fat or sweetened yogurt. Full-fat cheese. Nondairy creamers. Whipped toppings.  Processed cheese and cheese spreads. Fats and oils Butter. Stick margarine. Lard. Shortening. Ghee. Bacon fat. Tropical oils, such as coconut, palm kernel, or palm oil. Seasoning and other foods Salted popcorn and pretzels. Onion salt, garlic salt, seasoned salt, table salt, and sea salt. Worcestershire sauce. Tartar sauce. Barbecue sauce. Teriyaki sauce. Soy sauce, including reduced-sodium. Steak sauce. Canned and packaged gravies. Fish sauce. Oyster sauce. Cocktail sauce. Horseradish that you find on the shelf. Ketchup. Mustard. Meat flavorings and tenderizers. Bouillon cubes. Hot sauce and Tabasco sauce. Premade or packaged marinades. Premade or packaged taco seasonings. Relishes. Regular salad dressings. Where to find more information:  National Heart, Lung, and Blood Institute: www.nhlbi.nih.gov  American Heart Association: www.heart.org Summary  The DASH eating plan is a healthy eating plan that has been shown to reduce high blood pressure (hypertension). It may also reduce your risk for type 2 diabetes, heart disease, and stroke.  With the DASH eating plan, you should limit salt (sodium) intake to 2,300 mg a day. If you have hypertension, you may need to reduce your sodium intake to 1,500 mg a day.  When on the DASH eating plan, aim to eat more fresh fruits and vegetables, whole grains, lean proteins, low-fat dairy, and heart-healthy fats.  Work with your health care provider or diet and nutrition specialist (dietitian) to adjust your eating plan to your   individual calorie needs. This information is not intended to replace advice given to you by your health care provider. Make sure you discuss any questions you have with your health care provider. Document Revised: 06/04/2017 Document Reviewed: 06/15/2016 Elsevier Patient Education  2020 Elsevier Inc.  

## 2020-01-24 NOTE — Progress Notes (Signed)
   Subjective:    Patient ID: Bryan Gilbert, male    DOB: 09/11/1965, 54 y.o.   MRN: [id number]  HPI Pt here for follow up on medications. Pt doing well on meds. Taking meds as prescribed.  Essential hypertension - Plan: Hemoglobin A1c, PSA, Lipid Profile  Obstructive sleep apnea - Plan: PSG Sleep Study  Prediabetes - Plan: Hemoglobin A1c, PSA, Lipid Profile  Screening PSA (prostate specific antigen) - Plan: Hemoglobin A1c, PSA, Lipid Profile  Other hyperlipidemia - Plan: Hemoglobin A1c, PSA, Lipid Profile   Pt states he may need an updated sleep study.  States he does not feel this CPAP machine works anymore states it is somewhat old denies any setback or problems  Sinus issues for about one month. Sinus headache, drainage and some cough. Uses Sudafed. Patient with moderate head congestion sinus pressure sinus pain been going on for weeks worse over the past few days denies high fever chills  Review of Systems See above    Objective:   Physical Exam Lungs clear heart regular HEENT benign moderate sinus tenderness       Assessment & Plan:  Prediabetes watch diet try to lose weight check A1c before next visit  Hyperlipidemia check lab work await results  Patient due for PSA screening  Patient with sleep apnea not under good control check sleep study follow-up within 6 months

## 2020-02-15 ENCOUNTER — Telehealth: Payer: Self-pay | Admitting: Urology

## 2020-02-15 NOTE — Telephone Encounter (Signed)
Pt states he needs med refill. Requests a nurse return the call.

## 2020-02-16 ENCOUNTER — Telehealth: Payer: Self-pay | Admitting: Urology

## 2020-02-16 NOTE — Telephone Encounter (Signed)
See other task

## 2020-02-16 NOTE — Telephone Encounter (Signed)
Pt asks for a nurse call regarding medication issue with pharmacy.

## 2020-02-16 NOTE — Telephone Encounter (Signed)
Can you please refill his testosterone rx? He is waiting to get lab work but needs his refill first.

## 2020-02-20 NOTE — Telephone Encounter (Signed)
Pt called again today asking for testosterone rx to be refilled.

## 2020-02-21 MED ORDER — TESTOSTERONE 20.25 MG/ACT (1.62%) TD GEL: TRANSDERMAL | 5 refills | Status: DC

## 2020-02-21 NOTE — Telephone Encounter (Signed)
Rx sent to [facility]

## 2020-02-21 NOTE — Telephone Encounter (Signed)
Pt called. Left message that a rx was sent to pharmacy.

## 2020-02-27 ENCOUNTER — Other Ambulatory Visit: Payer: Self-pay | Admitting: Family Medicine

## 2020-04-16 ENCOUNTER — Other Ambulatory Visit: Payer: Self-pay

## 2020-04-16 ENCOUNTER — Emergency Department (HOSPITAL_COMMUNITY)
Admission: EM | Admit: 2020-04-16 | Discharge: 2020-04-16 | Disposition: A | Discharge status: Home / Self Care | Payer: Managed Care, Other (non HMO) | Attending: Emergency Medicine | Admitting: Emergency Medicine

## 2020-04-16 ENCOUNTER — Telehealth: Payer: Self-pay | Admitting: Family Medicine

## 2020-04-16 ENCOUNTER — Encounter (HOSPITAL_COMMUNITY): Payer: Self-pay | Admitting: *Deleted

## 2020-04-16 DIAGNOSIS — R1110 Vomiting, unspecified: Secondary | ICD-10-CM | POA: Diagnosis not present

## 2020-04-16 DIAGNOSIS — R1084 Generalized abdominal pain: Secondary | ICD-10-CM | POA: Insufficient documentation

## 2020-04-16 DIAGNOSIS — D72829 Elevated white blood cell count, unspecified: Secondary | ICD-10-CM | POA: Insufficient documentation

## 2020-04-16 DIAGNOSIS — Z79899 Other long term (current) drug therapy: Secondary | ICD-10-CM | POA: Insufficient documentation

## 2020-04-16 DIAGNOSIS — R109 Unspecified abdominal pain: Secondary | ICD-10-CM

## 2020-04-16 DIAGNOSIS — I10 Essential (primary) hypertension: Secondary | ICD-10-CM | POA: Diagnosis not present

## 2020-04-16 LAB — CBC WITH DIFFERENTIAL/PLATELET
Abs Immature Granulocytes: 0.06 10*3/uL (ref 0.00–0.07)
Basophils Absolute: 0.1 10*3/uL (ref 0.0–0.1)
Basophils Relative: 0 %
Eosinophils Absolute: 0 10*3/uL (ref 0.0–0.5)
Eosinophils Relative: 0 %
HCT: 49.9 % (ref 39.0–52.0)
Hemoglobin: 17.4 g/dL — ABNORMAL HIGH (ref 13.0–17.0)
Immature Granulocytes: 0 %
Lymphocytes Relative: 10 %
Lymphs Abs: 1.5 10*3/uL (ref 0.7–4.0)
MCH: 29.4 pg (ref 26.0–34.0)
MCHC: 34.9 g/dL (ref 30.0–36.0)
MCV: 84.4 fL (ref 80.0–100.0)
Monocytes Absolute: 0.6 10*3/uL (ref 0.1–1.0)
Monocytes Relative: 4 %
Neutro Abs: 12.2 10*3/uL — ABNORMAL HIGH (ref 1.7–7.7)
Neutrophils Relative %: 86 %
Platelets: 330 10*3/uL (ref 150–400)
RBC: 5.91 MIL/uL — ABNORMAL HIGH (ref 4.22–5.81)
RDW: 13 % (ref 11.5–15.5)
WBC: 14.3 10*3/uL — ABNORMAL HIGH (ref 4.0–10.5)
nRBC: 0 % (ref 0.0–0.2)

## 2020-04-16 LAB — COMPREHENSIVE METABOLIC PANEL
ALT: 42 U/L (ref 0–44)
AST: 25 U/L (ref 15–41)
Albumin: 4.2 g/dL (ref 3.5–5.0)
Alkaline Phosphatase: 66 U/L (ref 38–126)
Anion gap: 11 (ref 5–15)
BUN: 25 mg/dL — ABNORMAL HIGH (ref 6–20)
CO2: 21 mmol/L — ABNORMAL LOW (ref 22–32)
Calcium: 9.6 mg/dL (ref 8.9–10.3)
Chloride: 105 mmol/L (ref 98–111)
Creatinine, Ser: 0.82 mg/dL (ref 0.61–1.24)
GFR, Estimated: >60 mL/min (ref >60)
Glucose, Bld: 165 mg/dL — ABNORMAL HIGH (ref 70–99)
Potassium: 3.2 mmol/L — ABNORMAL LOW (ref 3.5–5.1)
Sodium: 137 mmol/L (ref 135–145)
Total Bilirubin: 1.8 mg/dL — ABNORMAL HIGH (ref 0.3–1.2)
Total Protein: 7.7 g/dL (ref 6.5–8.1)

## 2020-04-16 LAB — URINALYSIS, ROUTINE W REFLEX MICROSCOPIC
Bilirubin Urine: NEGATIVE
Glucose, UA: NEGATIVE mg/dL
Hgb urine dipstick: NEGATIVE
Ketones, ur: 5 mg/dL — AB
Leukocytes,Ua: NEGATIVE
Nitrite: NEGATIVE
Protein, ur: 30 mg/dL — AB
Specific Gravity, Urine: 1.021 (ref 1.005–1.030)
pH: 7 (ref 5.0–8.0)

## 2020-04-16 LAB — RAPID URINE DRUG SCREEN, HOSP PERFORMED
Amphetamines: NOT DETECTED
Barbiturates: NOT DETECTED
Benzodiazepines: NOT DETECTED
Cocaine: NOT DETECTED
Opiates: POSITIVE — AB
Tetrahydrocannabinol: NOT DETECTED

## 2020-04-16 LAB — LIPASE, BLOOD: Lipase: 17 U/L (ref 11–51)

## 2020-04-16 MED ORDER — ONDANSETRON HCL 4 MG/2ML IJ SOLN
4.0000 mg | Freq: Once | INTRAMUSCULAR | Status: AC
  Administered 2020-04-16: 4 mg via INTRAVENOUS
  Filled 2020-04-16: qty 2

## 2020-04-16 MED ORDER — CALCIUM CARBONATE ANTACID 500 MG PO CHEW
1.0000 | CHEWABLE_TABLET | Freq: Three times a day (TID) | ORAL | Status: DC | PRN
  Administered 2020-04-16: 200 mg via ORAL
  Filled 2020-04-16: qty 1

## 2020-04-16 MED ORDER — HYDROMORPHONE HCL 1 MG/ML IJ SOLN
1.0000 mg | INTRAMUSCULAR | Status: AC | PRN
  Administered 2020-04-16 (×3): 1 mg via INTRAVENOUS
  Filled 2020-04-16 (×3): qty 1

## 2020-04-16 MED ORDER — HYDROMORPHONE HCL 1 MG/ML IJ SOLN
1.0000 mg | Freq: Once | INTRAMUSCULAR | Status: AC
  Administered 2020-04-16: 1 mg via INTRAVENOUS
  Filled 2020-04-16: qty 1

## 2020-04-16 MED ORDER — SODIUM CHLORIDE 0.9 % IV BOLUS
1000.0000 mL | Freq: Once | INTRAVENOUS | Status: AC
  Administered 2020-04-16: 1000 mL via INTRAVENOUS

## 2020-04-16 MED ORDER — PANTOPRAZOLE SODIUM 40 MG IV SOLR
40.0000 mg | Freq: Once | INTRAVENOUS | Status: AC
  Administered 2020-04-16: 40 mg via INTRAVENOUS
  Filled 2020-04-16: qty 40

## 2020-04-16 MED ORDER — POTASSIUM CHLORIDE CRYS ER 20 MEQ PO TBCR
40.0000 meq | EXTENDED_RELEASE_TABLET | Freq: Once | ORAL | Status: AC
  Administered 2020-04-16: 40 meq via ORAL
  Filled 2020-04-16: qty 2

## 2020-04-16 NOTE — ED Triage Notes (Signed)
Pt with abdominal migraines, has ran out of medication for past 3-4 days.  Pt refused to allow staff to get oral temperature.

## 2020-04-16 NOTE — Discharge Instructions (Addendum)
Continue your regular medicines.  See your doctor as needed for problems.

## 2020-04-16 NOTE — ED Provider Notes (Signed)
3:25 PM-checkout from Dr. Karle Starch to evaluate patient, who is a migraineur with abdominal pain today, also having vomiting.  He has been out of his Stadol nasal spray because his pharmacy did not have it in stock, when he went to get it today.  He had to use 2 bottles of Stadol within the last month month.  He takes high-dose Nucynta and hydrocodone, daily for pain.  He is being treated symptomatically and initial plans are for discharge when stable.  5:27 PM Reevaluation with update and discussion. After initial assessment and treatment, an updated evaluation reveals he states the pain is almost gone but he needs 1 more injection to get it to "0."  That he will be able to go home.  He thinks he might be able to get his Stadol prescription tomorrow, and was able to secure one from his wife who had at home in her refrigerator. Rosana Hoes, MD 04/16/20 [phone]

## 2020-04-16 NOTE — ED Provider Notes (Signed)
[facility] EMERGENCY DEPARTMENT Provider Note  CSN: [id number] Arrival date & time: 04/16/20 1142    History Chief Complaint  Patient presents with  . Abdominal Pain    HPI  Adryan Shin Takemoto is a 54 y.o. male with long history of abdominal migraines and cyclic vomiting syndrome diagnosed at [facility] and typically managed at home with Stadol nasal spray reports several days of severe cramping abdominal pain and vomiting similar to previous. He states he has been out of his Stadol nasal spray and has not been able to get a refill because his pharmacy did not have it in stock. The Nucynta and Hydrocodone he takes for his chronic neck pain, prescribed by [facility], has not been helping and also he has not been able to keep it down. He denies any hematemesis, no diarrhea or constipation. No fevers, cough, or SOB. He states in the past the only thing that has helped is Dilaudid. He denies any EtOH or marijuana use.    Past Medical History:  Diagnosis Date  . Abdominal migraine   . Cervical neuralgia   . Cyclical vomiting   . Elevated hemoglobin ([place])   . Hypertension   . Sleep apnea    states uses O2 and CPAP at night  . Slipped intervertebral disc     Past Surgical History:  Procedure Laterality Date  . ESOPHAGOGASTRODUODENOSCOPY      Family History  Problem Relation Age of Onset  . Heart failure Father   . Hypertension Father     Social History   Tobacco Use  . Smoking status: Never Smoker  . Smokeless tobacco: Never Used  Substance Use Topics  . Alcohol use: No  . Drug use: No     Home Medications Prior to Admission medications   Medication Sig Start Date End Date Taking? Authorizing Provider  amoxicillin-clavulanate (AUGMENTIN) 875-125 MG tablet Take 1 tablet by mouth 2 (two) times daily. 01/24/20   Kathyrn Drown, MD  butorphanol (STADOL) 10 MG/ML nasal spray Place into the nose. 07/28/17   [provider]  cyclobenzaprine (FLEXERIL) 10 MG  tablet cyclobenzaprine 10 mg tablet  TAKE 1 TABLET BY MOUTH THREE TIMES A DAY AS NEEDED    [provider]  HYDROcodone-acetaminophen (NORCO) 10-325 MG tablet Take one every 4 hours as needed for pain. One up to 6 times daily. 09/17/15   [provider]  levorphanol (LEVODROMORAN) 2 MG tablet Take 3 mg by mouth 3 (three) times daily. 09/12/19   [provider]  lisinopril-hydrochlorothiazide (ZESTORETIC) 10-12.5 MG tablet Take 1 tablet by mouth daily. 01/24/20   Kathyrn Drown, MD  loperamide (IMODIUM) 2 MG capsule Take 2 mg by mouth as needed for diarrhea or loose stools.    [provider]  meloxicam (MOBIC) 15 MG tablet Take 1 tablet (15 mg total) by mouth daily. 01/24/20   Kathyrn Drown, MD  SALINE NASAL SPRAY NA Place 1 spray into the nose daily as needed (dryness). Reported on 09/17/2015    [provider]  sildenafil (VIAGRA) 100 MG tablet TAKE 1/2 TO 1 TABLET ONE HOUR BEFORE NEEDED 02/27/20   Kathyrn Drown, MD  simethicone (MYLICON) 80 MG chewable tablet Chew 80 mg by mouth every 6 (six) hours as needed for flatulence.    [provider]  Testosterone 20.25 MG/ACT (1.62%) GEL APPLY 2 PUMPS ONTO THE SKIN IN THE MORNING 02/21/20   McKenzie, Candee Furbish, MD     Allergies  Cefprozil and Lipitor [atorvastatin]   Review of Systems   Review of Systems A comprehensive review of systems was completed and negative except as noted in HPI.    Physical Exam BP (!) 162/108   Pulse 89   Temp (!) 97.1 F (36.2 C)   Resp 20   Ht 5\' 7"  (1.702 m)   Wt 99.8 kg   SpO2 95%   BMI 34.46 kg/m   Physical Exam Vitals and nursing note reviewed.  Constitutional:      Appearance: Normal appearance.  HENT:     Head: Normocephalic and atraumatic.     Nose: Nose normal.     Mouth/Throat:     Mouth: Mucous membranes are moist.  Eyes:     Extraocular Movements: Extraocular movements intact.     Conjunctiva/sclera: Conjunctivae normal.    Cardiovascular:     Rate and Rhythm: Normal rate.  Pulmonary:     Effort: Pulmonary effort is normal.     Breath sounds: Normal breath sounds.  Abdominal:     General: Abdomen is flat. There is no distension.     Palpations: Abdomen is soft.     Tenderness: There is abdominal tenderness (diffuse). There is no guarding.  Musculoskeletal:        General: No swelling. Normal range of motion.     Cervical back: Neck supple.  Skin:    General: Skin is warm and dry.  Neurological:     General: No focal deficit present.     Mental Status: He is alert.  Psychiatric:        Mood and Affect: Mood normal.      ED Results / Procedures / Treatments   Labs (all labs ordered are listed, but only abnormal results are displayed) Labs Reviewed  COMPREHENSIVE METABOLIC PANEL - Abnormal; Notable for the following components:      Result Value   Potassium 3.2 (*)    CO2 21 (*)    Glucose, Bld 165 (*)    BUN 25 (*)    Total Bilirubin 1.8 (*)    All other components within normal limits  CBC WITH DIFFERENTIAL/PLATELET - Abnormal; Notable for the following components:   WBC 14.3 (*)    RBC 5.91 (*)    Hemoglobin 17.4 (*)    Neutro Abs 12.2 (*)    All other components within normal limits  LIPASE, BLOOD  URINALYSIS, ROUTINE W REFLEX MICROSCOPIC  RAPID URINE DRUG SCREEN, HOSP PERFORMED    EKG None  Radiology No results found.  Procedures Procedures  Medications Ordered in the ED Medications  potassium chloride SA (KLOR-CON) CR tablet 40 mEq (has no administration in time range)  sodium chloride 0.9 % bolus 1,000 mL (has no administration in time range)  pantoprazole (PROTONIX) injection 40 mg (has no administration in time range)  sodium chloride 0.9 % bolus 1,000 mL (1,000 mLs Intravenous New Bag/Given 04/16/20 1350)  HYDROmorphone (DILAUDID) injection 1 mg (1 mg Intravenous Given 04/16/20 1351)  ondansetron (ZOFRAN) injection 4 mg (4 mg Intravenous Given 04/16/20 1351)   HYDROmorphone (DILAUDID) injection 1 mg (1 mg Intravenous Given 04/16/20 1510)     MDM Rules/Calculators/A&P MDM Labs ordered, IVF, pain/nausea meds.  ED Course  I have reviewed the triage vital signs and the nursing notes.  Pertinent labs & imaging results that were available during my care of the patient were reviewed by me and considered in my medical decision making (see chart for details).  Clinical Course as of Apr 16 1522  Tue Apr 16, 2020  1403 CBC shows mild leukocytosis.    [CS]  1282 CMP with mild hypokalemia, will replete orally; lipase is unremarkable.    [CS]  0813 Patient reports minimal improvement in pain which is now coming back. Additional IVF and pain medications ordered. He is also requesting Protonix.    [CS]  8871 Care of the patient signed out to Dr. Eulis Foster at the change of shift pending symptom management, UA/UDS and dispo.    [CS]    Clinical Course User Index [CS] Truddie Hidden, MD    Final Clinical Impression(s) / ED Diagnoses Final diagnoses:  None    Rx / DC Orders ED Discharge Orders    None       Truddie Hidden, MD 04/16/20 1523

## 2020-04-17 ENCOUNTER — Telehealth: Payer: Self-pay | Admitting: Family Medicine

## 2020-04-17 ENCOUNTER — Other Ambulatory Visit: Payer: Self-pay | Admitting: Family Medicine

## 2020-04-17 NOTE — Telephone Encounter (Signed)
(  Nurses-this patient's pain medications are more than what I will prescribe.  Potentially there are several other pain management places in [place] or [place] or [place] that [facility] could refer him to?  He could also talk with his insurance company to find out if there are other pain management centers that they cover in the [place] region and then if he needs a referral back to me could do that or we could) Also was the reason that they cannot handle him down in [place]? (Feel free to acquire additional information but I will not be able to act on that until Monday)

## 2020-04-17 NOTE — Telephone Encounter (Signed)
Pt contacted office and states that he was being seen at [facility] for pain management. The provider he was seeing there wants to send him to [facility] in [place]. Pt states that is to far for him. Please advise. Thank you

## 2020-04-17 NOTE — Telephone Encounter (Signed)
Contacted patient. Pt states that there was no reason that pain management wanted to switch him. Pt states that "they thought it would be best to send you to [facility] in [place]'. Pt states that they messed up his abdominal migraine meds and was in the ER yesterday. Pt states he called [place] because he was referred to [facility] from [place] and figured that [place] would refer him to a good pain doctor and not one that just writes scripts.   (Informed pt that it would be Monday before Dr.Scott could act on anything. Pt verbalized understanding)

## 2020-04-21 ENCOUNTER — Other Ambulatory Visit: Payer: Self-pay

## 2020-04-21 ENCOUNTER — Encounter (HOSPITAL_COMMUNITY): Payer: Self-pay

## 2020-04-21 ENCOUNTER — Inpatient Hospital Stay (HOSPITAL_COMMUNITY)
Admission: EM | Admit: 2020-04-21 | Discharge: 2020-04-25 | DRG: 103 | Disposition: A | Discharge status: Home / Self Care | Payer: Managed Care, Other (non HMO) | Attending: Internal Medicine | Admitting: Internal Medicine

## 2020-04-21 DIAGNOSIS — D72829 Elevated white blood cell count, unspecified: Secondary | ICD-10-CM | POA: Diagnosis present

## 2020-04-21 DIAGNOSIS — I10 Essential (primary) hypertension: Secondary | ICD-10-CM | POA: Diagnosis not present

## 2020-04-21 DIAGNOSIS — Z791 Long term (current) use of non-steroidal anti-inflammatories (NSAID): Secondary | ICD-10-CM

## 2020-04-21 DIAGNOSIS — R1084 Generalized abdominal pain: Secondary | ICD-10-CM

## 2020-04-21 DIAGNOSIS — Z79890 Hormone replacement therapy (postmenopausal): Secondary | ICD-10-CM

## 2020-04-21 DIAGNOSIS — G43D0 Abdominal migraine, not intractable: Secondary | ICD-10-CM | POA: Diagnosis present

## 2020-04-21 DIAGNOSIS — R1115 Cyclical vomiting syndrome unrelated to migraine: Secondary | ICD-10-CM | POA: Diagnosis present

## 2020-04-21 DIAGNOSIS — E876 Hypokalemia: Secondary | ICD-10-CM | POA: Diagnosis present

## 2020-04-21 DIAGNOSIS — M549 Dorsalgia, unspecified: Secondary | ICD-10-CM | POA: Diagnosis present

## 2020-04-21 DIAGNOSIS — G43D1 Abdominal migraine, intractable: Principal | ICD-10-CM | POA: Diagnosis present

## 2020-04-21 DIAGNOSIS — F1120 Opioid dependence, uncomplicated: Secondary | ICD-10-CM | POA: Diagnosis present

## 2020-04-21 DIAGNOSIS — G43A1 Cyclical vomiting, intractable: Secondary | ICD-10-CM | POA: Diagnosis present

## 2020-04-21 DIAGNOSIS — E860 Dehydration: Secondary | ICD-10-CM | POA: Diagnosis present

## 2020-04-21 DIAGNOSIS — Z20822 Contact with and (suspected) exposure to covid-19: Secondary | ICD-10-CM | POA: Diagnosis present

## 2020-04-21 DIAGNOSIS — G4730 Sleep apnea, unspecified: Secondary | ICD-10-CM | POA: Diagnosis present

## 2020-04-21 DIAGNOSIS — Z79899 Other long term (current) drug therapy: Secondary | ICD-10-CM

## 2020-04-21 LAB — BASIC METABOLIC PANEL
Anion gap: 10 (ref 5–15)
BUN: 26 mg/dL — ABNORMAL HIGH (ref 6–20)
CO2: 21 mmol/L — ABNORMAL LOW (ref 22–32)
Calcium: 8.5 mg/dL — ABNORMAL LOW (ref 8.9–10.3)
Chloride: 108 mmol/L (ref 98–111)
Creatinine, Ser: 0.86 mg/dL (ref 0.61–1.24)
GFR, Estimated: >60 mL/min (ref >60)
Glucose, Bld: 134 mg/dL — ABNORMAL HIGH (ref 70–99)
Potassium: 3.1 mmol/L — ABNORMAL LOW (ref 3.5–5.1)
Sodium: 139 mmol/L (ref 135–145)

## 2020-04-21 LAB — RESPIRATORY PANEL BY RT PCR (FLU A&B, COVID)
Influenza A by PCR: NEGATIVE
Influenza B by PCR: NEGATIVE
SARS Coronavirus 2 by RT PCR: NEGATIVE

## 2020-04-21 MED ORDER — SODIUM CHLORIDE 0.9 % IV BOLUS
1000.0000 mL | Freq: Once | INTRAVENOUS | Status: AC
  Administered 2020-04-21: 1000 mL via INTRAVENOUS

## 2020-04-21 MED ORDER — POTASSIUM CHLORIDE 2 MEQ/ML IV SOLN
INTRAVENOUS | Status: DC
  Filled 2020-04-21 (×3): qty 1000

## 2020-04-21 MED ORDER — HYDROMORPHONE HCL 2 MG/ML IJ SOLN
2.0000 mg | INTRAMUSCULAR | Status: DC | PRN
  Administered 2020-04-21: 2 mg via INTRAVENOUS
  Filled 2020-04-21: qty 1

## 2020-04-21 MED ORDER — HYDROMORPHONE HCL 1 MG/ML IJ SOLN
1.0000 mg | INTRAMUSCULAR | Status: AC
  Administered 2020-04-21 (×3): 1 mg via INTRAVENOUS
  Filled 2020-04-21 (×3): qty 1

## 2020-04-21 MED ORDER — KETOROLAC TROMETHAMINE 30 MG/ML IJ SOLN
30.0000 mg | Freq: Four times a day (QID) | INTRAMUSCULAR | Status: DC
  Administered 2020-04-21 – 2020-04-25 (×13): 30 mg via INTRAVENOUS
  Filled 2020-04-21 (×13): qty 1

## 2020-04-21 MED ORDER — PANTOPRAZOLE SODIUM 40 MG IV SOLR
40.0000 mg | Freq: Two times a day (BID) | INTRAVENOUS | Status: DC
  Administered 2020-04-21 – 2020-04-25 (×8): 40 mg via INTRAVENOUS
  Filled 2020-04-21 (×8): qty 40

## 2020-04-21 MED ORDER — HYDROMORPHONE HCL 1 MG/ML IJ SOLN
1.0000 mg | Freq: Once | INTRAMUSCULAR | Status: AC
  Administered 2020-04-21: 1 mg via INTRAVENOUS
  Filled 2020-04-21: qty 1

## 2020-04-21 MED ORDER — PROMETHAZINE HCL 25 MG/ML IJ SOLN
25.0000 mg | Freq: Four times a day (QID) | INTRAMUSCULAR | Status: DC | PRN
  Filled 2020-04-21: qty 1

## 2020-04-21 MED ORDER — POTASSIUM CHLORIDE 10 MEQ/100ML IV SOLN
10.0000 meq | Freq: Once | INTRAVENOUS | Status: AC
  Administered 2020-04-21: 10 meq via INTRAVENOUS
  Filled 2020-04-21: qty 100

## 2020-04-21 MED ORDER — HYDROMORPHONE HCL 1 MG/ML IJ SOLN
1.0000 mg | INTRAMUSCULAR | Status: AC
  Administered 2020-04-21 (×2): 1 mg via INTRAVENOUS
  Filled 2020-04-21 (×2): qty 1

## 2020-04-21 MED ORDER — ACETAMINOPHEN 650 MG RE SUPP: 650.0000 mg | Freq: Four times a day (QID) | RECTAL | Status: DC | PRN

## 2020-04-21 MED ORDER — CYCLOBENZAPRINE HCL 10 MG PO TABS: 10.0000 mg | ORAL_TABLET | Freq: Three times a day (TID) | ORAL | Status: DC | PRN

## 2020-04-21 MED ORDER — PROMETHAZINE HCL 25 MG/ML IJ SOLN
25.0000 mg | Freq: Once | INTRAMUSCULAR | Status: AC
  Administered 2020-04-21: 25 mg via INTRAVENOUS
  Filled 2020-04-21: qty 1

## 2020-04-21 MED ORDER — ENOXAPARIN SODIUM 60 MG/0.6ML ~~LOC~~ SOLN
0.5000 mg/kg | SUBCUTANEOUS | Status: DC
  Administered 2020-04-21 – 2020-04-24 (×3): 50 mg via SUBCUTANEOUS
  Filled 2020-04-21 (×3): qty 0.6

## 2020-04-21 MED ORDER — ACETAMINOPHEN 325 MG PO TABS: 650.0000 mg | ORAL_TABLET | Freq: Four times a day (QID) | ORAL | Status: DC | PRN

## 2020-04-21 NOTE — ED Notes (Signed)
Pt noted to be vomiting over the edge of bed rails leaned up on his knees . Nurse advised pt to lean back so that he doesn't fall and hit his head from leaning forward with gravity while vomiting. Wife told nurse " We have been coming here since 2003 and he will fall off the end of the bed before he falls off the the side. " Nurse explained " he is leaning over the bed rail  while vomiting and gravity could easily make him fall over. " Wife explained " he will be fine". Nurse said [facility] and confirmed she was the wife and let her be aware this would be charted incase he falls.

## 2020-04-21 NOTE — ED Provider Notes (Addendum)
[facility] EMERGENCY DEPARTMENT Provider Note   CSN: [id number] Arrival date & time: 04/21/20  1055     History Chief Complaint  Patient presents with  . Abdominal Pain    Bryan Gilbert is a 54 y.o. male.  HPI 54 year old male with a history of abdominal migraines, cervical neuralgia, cyclical vomiting, hypertension, narcotic dependency presents to the ER with complaints of cyclical vomiting and abdominal pain.  Patient states that this is consistent with his abdominal migraine.  He was seen here in the ER for the same approximately 5 days ago, received multiple rounds of Dilaudid which she states is the only thing that works for him.  He is also on chronic opioids for back pain but these do not work for him per the patient report.  He normally takes Stadol nasal spray which helps with the cyclical vomiting, he was able to refill it several days ago but he has now been out.  He states that normally his cycles last several days, with the longest being 5 days.  He said that this episode has been lasting for 10 days which is the longest it ever been.  He denies any fevers or chills, cough, chest pain or shortness of breath.  He is followed by Dr. Alverda Skeans with [facility] neurology.    Past Medical History:  Diagnosis Date  . Abdominal migraine   . Cervical neuralgia   . Cyclical vomiting   . Elevated hemoglobin ([place])   . Hypertension   . Sleep apnea    states uses O2 and CPAP at night  . Slipped intervertebral disc     Patient Active Problem List   Diagnosis Date Noted  . Narcotic dependency, continuous ([place]) 09/21/2015  . Abdominal pain, left upper quadrant 09/19/2015  . Abdominal pain 09/19/2015  . Cervical neuralgia 09/19/2015  . Left upper quadrant pain   . Insomnia 03/02/2014  . Chronic radicular cervical pain 11/01/2013  . Elevated transaminase level 07/10/2013  . Cyclical vomiting syndrome 06/09/2013  . Male hypogonadism 03/15/2013  . Abnormal TSH 02/12/2012  .  Hyperglycemia 02/11/2012  . Obstructive sleep apnea 02/11/2012  . Polycythemia, secondary 02/11/2012  . Cyclic vomiting syndrome 07/10/2011  . HTN (hypertension) 07/10/2011  . Metabolic acidosis 01/74/9449    Past Surgical History:  Procedure Laterality Date  . ESOPHAGOGASTRODUODENOSCOPY         Family History  Problem Relation Age of Onset  . Heart failure Father   . Hypertension Father     Social History   Tobacco Use  . Smoking status: Never Smoker  . Smokeless tobacco: Never Used  Substance Use Topics  . Alcohol use: No  . Drug use: No    Home Medications Prior to Admission medications   Medication Sig Start Date End Date Taking? Authorizing Provider  amoxicillin-clavulanate (AUGMENTIN) 875-125 MG tablet Take 1 tablet by mouth 2 (two) times daily. 01/24/20   Kathyrn Drown, MD  butorphanol (STADOL) 10 MG/ML nasal spray Place 1 spray into the nose every 4 (four) hours as needed for headache or migraine.  07/28/17   [provider]  cyclobenzaprine (FLEXERIL) 10 MG tablet Take 10 mg by mouth 3 (three) times daily as needed for muscle spasms.     [provider]  HYDROcodone-acetaminophen (NORCO) 10-325 MG tablet Take 1 tablet by mouth every 4 (four) hours as needed for moderate pain.  09/17/15   [provider]  levorphanol (LEVODROMORAN) 2 MG tablet Take 3 mg by mouth 3 (three)  times daily. 09/12/19   [provider]  lisinopril-hydrochlorothiazide (ZESTORETIC) 10-12.5 MG tablet Take 1 tablet by mouth daily. 01/24/20   Kathyrn Drown, MD  loperamide (IMODIUM) 2 MG capsule Take 2 mg by mouth as needed for diarrhea or loose stools.    [provider]  meloxicam (MOBIC) 15 MG tablet Take 1 tablet (15 mg total) by mouth daily. 01/24/20   Kathyrn Drown, MD  SALINE NASAL SPRAY NA Place 1 spray into the nose daily as needed (dryness). Reported on 09/17/2015    [provider]  sildenafil (VIAGRA) 100 MG tablet TAKE 1/2 TO 1  TABLET ONE HOUR BEFORE NEEDED Patient taking differently: Take 50-100 mg by mouth as needed for erectile dysfunction (one hour prior to need).  02/27/20   Kathyrn Drown, MD  simethicone (MYLICON) 80 MG chewable tablet Chew 80 mg by mouth every 6 (six) hours as needed for flatulence.    [provider]  Testosterone 20.25 MG/ACT (1.62%) GEL APPLY 2 PUMPS ONTO THE SKIN IN THE MORNING Patient taking differently: Apply 2 Pump topically in the morning.  02/21/20   McKenzie, Candee Furbish, MD    Allergies    Cefprozil and Lipitor [atorvastatin]  Review of Systems   Review of Systems  Constitutional: Negative for chills and fever.  HENT: Negative for ear pain and sore throat.   Eyes: Negative for pain and visual disturbance.  Respiratory: Negative for cough and shortness of breath.   Cardiovascular: Negative for chest pain and palpitations.  Gastrointestinal: Positive for abdominal pain and nausea. Negative for vomiting.  Genitourinary: Negative for dysuria and hematuria.  Musculoskeletal: Negative for arthralgias and back pain.  Skin: Negative for color change and rash.  Neurological: Negative for seizures and syncope.  All other systems reviewed and are negative.   Physical Exam Updated Vital Signs BP 119/86   Pulse 79   Temp 99 F (37.2 C) (Oral)   Resp 14   Ht 5\' 7"  (1.702 m)   Wt 99.8 kg   SpO2 93%   BMI 34.46 kg/m   Physical Exam Vitals and nursing note reviewed.  Constitutional:      Appearance: He is well-developed. He is not ill-appearing or diaphoretic.     Comments: Sitting on the edge of the bed, vomiting   HENT:     Head: Normocephalic and atraumatic.  Eyes:     Conjunctiva/sclera: Conjunctivae normal.  Cardiovascular:     Rate and Rhythm: Normal rate and regular rhythm.     Heart sounds: Normal heart sounds. No murmur heard.   Pulmonary:     Effort: Pulmonary effort is normal. No respiratory distress.     Breath sounds: Normal breath sounds.    Abdominal:     General: Abdomen is flat. Bowel sounds are normal.     Palpations: Abdomen is soft.     Tenderness: There is generalized abdominal tenderness.  Musculoskeletal:     Cervical back: Neck supple.  Skin:    General: Skin is warm and dry.     Findings: No rash.  Neurological:     General: No focal deficit present.     Mental Status: He is alert.  Psychiatric:        Mood and Affect: Mood is not anxious or depressed.     ED Results / Procedures / Treatments   Labs (all labs ordered are listed, but only abnormal results are displayed) Labs Reviewed  BASIC METABOLIC PANEL - Abnormal; Notable for the  following components:      Result Value   Potassium 3.1 (*)    CO2 21 (*)    Glucose, Bld 134 (*)    BUN 26 (*)    Calcium 8.5 (*)    All other components within normal limits  RESPIRATORY PANEL BY RT PCR (FLU A&B, COVID)    EKG EKG Interpretation  Date/Time:  Sunday April 21 2020 14:32:24 EDT Ventricular Rate:  82 PR Interval:    QRS Duration: 101 QT Interval:  370 QTC Calculation: 433 R Axis:   30 Text Interpretation: Sinus rhythm RSR' in V1 or V2, right VCD or RVH No significant change since last tracing Confirmed by Calvert Cantor [phone]) on 04/21/2020 2:47:16 PM   Radiology No results found.  Procedures Procedures (including critical care time)  Medications Ordered in ED Medications  potassium chloride 10 mEq in 100 mL IVPB (10 mEq Intravenous New Bag/Given 04/21/20 1434)  HYDROmorphone (DILAUDID) injection 1 mg (1 mg Intravenous Given 04/21/20 1222)  HYDROmorphone (DILAUDID) injection 1 mg (1 mg Intravenous Given 04/21/20 1326)  sodium chloride 0.9 % bolus 1,000 mL (0 mLs Intravenous Stopped 04/21/20 1325)  HYDROmorphone (DILAUDID) injection 1 mg (1 mg Intravenous Given 04/21/20 1456)  sodium chloride 0.9 % bolus 1,000 mL (0 mLs Intravenous Stopped 04/21/20 1425)    ED Course  I have reviewed the triage vital signs and the nursing  notes.  Pertinent labs & imaging results that were available during my care of the patient were reviewed by me and considered in my medical decision making (see chart for details).    MDM Rules/Calculators/A&P                         Patient presents to the ER with complaints of abdominal migraine and cyclic vomiting On presentation, he is alert, oriented, nontoxic-appearing, though he is sitting at the edge of the bed and vomiting.  Vitals overall reassuring.  Physical exam with generalized mild abdominal tenderness, no other acute findings.  Patient states that he needs multiple rounds of IV Dilaudid in order to help his symptoms.  He received 2 L of IV fluids, 3 rounds of Dilaudid and did note improvement in his symptoms.  He however is concerned about going home as he states that this happens every morning, and he is out of his Stadol.  Neurologist is not available on the weekends to consult.  His lab work also did show some hypokalemia likely in the setting of vomiting, no EKG changes.  Patient received 10 mEq of IV potassium here in the ED.  Patient expressed concerns about going home, requesting to be admitted for further fluids and monitoring as he states that he believes this will happen again tomorrow and he will be back.  He has already been here 5 days prior.  Consulted hospitalist team for admission for IV fluids, potentially more Dilaudid if needed.  Patient's neurologist will likely need to be consulted tomorrow.  Remains hemodynamically stable here at this time.  Final Clinical Impression(s) / ED Diagnoses Final diagnoses:  Generalized abdominal pain  Cyclical vomiting    Rx / DC Orders ED Discharge Orders    None         Garald Balding, PA-C 04/21/20 1519    Truddie Hidden, MD 04/22/20 [phone]

## 2020-04-21 NOTE — ED Notes (Signed)
Pt is  Pt with [facility]rologist Alverda Skeans who prescribes Stadol for cycling vomiting. Pt has been out of meds since yesterday, pharmacy ran out.

## 2020-04-21 NOTE — H&P (Signed)
History and Physical    [name] GBT:[id number] DOB: 26-Dec-1965 DOA: 04/21/2020  PCP: Kathyrn Drown, MD  Patient coming from: Home  I have personally briefly reviewed patient's old medical records in [place]  Chief Complaint: Abdominal pain  HPI: Bryan Gilbert is a 54 y.o. male with medical history significant of abdominal migraines/cyclical vomiting syndrome, sleep apnea, presents to the hospital with abdominal pain and vomiting.  Patient symptoms started approximately 7 to 10 days ago.  His symptoms are typical for abdominal migraines which he usually tries to treat at home.  He is followed at [facility] neurology and usually takes Stadol to help resolve his abdominal migraine.  Unfortunately, he ran out of this medication.  He presented to the emergency room on 10/14 and was treated with intravenous pain medications and Zofran.  He felt mildly better and elected to go home.  Unfortunately his symptoms persisted.  He has been unable to keep anything down by mouth.  Symptoms appear to be worse in the mornings.  Patient has a long history of the same.  He has not had any fever, cough, shortness of breath, diarrhea, dysuria  ED Course: Patient received several doses of Dilaudid in the emergency room with some improvement of his symptoms.  He was noted to be hypokalemic, dehydrated and had a mild leukocytosis.  Review of Systems: Review of Systems  Constitutional: Positive for weight loss. Negative for chills and fever.  HENT: Negative for congestion and sore throat.   Eyes: Negative for blurred vision and double vision.  Respiratory: Negative for cough, shortness of breath and wheezing.   Cardiovascular: Negative for chest pain and palpitations.  Gastrointestinal: Positive for abdominal pain, nausea and vomiting. Negative for blood in stool, diarrhea and heartburn.  Genitourinary: Negative for dysuria.  Musculoskeletal: Positive for neck pain.  Neurological: Positive for weakness.  Negative for dizziness and loss of consciousness.      Past Medical History:  Diagnosis Date  . Abdominal migraine   . Cervical neuralgia   . Cyclical vomiting   . Elevated hemoglobin ([place])   . Hypertension   . Sleep apnea    states uses O2 and CPAP at night  . Slipped intervertebral disc     Past Surgical History:  Procedure Laterality Date  . ESOPHAGOGASTRODUODENOSCOPY      Social History:  reports that he has never smoked. He has never used smokeless tobacco. He reports that he does not drink alcohol and does not use drugs.  Allergies  Allergen Reactions  . Cefprozil Diarrhea and Nausea Only    Intestinal upset and diarrhea  . Lipitor [Atorvastatin] Other (See Comments)    Myalgia  . Xtandi [Enzalutamide]     GI upset    Family History  Problem Relation Age of Onset  . Heart failure Father   . Hypertension Father     Prior to Admission medications   Medication Sig Start Date End Date Taking? Authorizing Provider  butorphanol (STADOL) 10 MG/ML nasal spray Place 1 spray into the nose every 4 (four) hours as needed for headache or migraine.  07/28/17  Yes [provider]  cyclobenzaprine (FLEXERIL) 10 MG tablet Take 10 mg by mouth 3 (three) times daily as needed for muscle spasms.    Yes [provider]  HYDROcodone-acetaminophen (NORCO) 10-325 MG tablet Take 1 tablet by mouth every 4 (four) hours as needed for moderate pain.  09/17/15  Yes [provider]  lisinopril-hydrochlorothiazide (ZESTORETIC) 10-12.5 MG tablet  Take 1 tablet by mouth daily. 01/24/20  Yes Kathyrn Drown, MD  loperamide (IMODIUM) 2 MG capsule Take 2 mg by mouth as needed for diarrhea or loose stools.   Yes [provider]  meloxicam (MOBIC) 15 MG tablet Take 1 tablet (15 mg total) by mouth daily. 01/24/20  Yes Luking, Elayne Snare, MD  SALINE NASAL SPRAY NA Place 1 spray into the nose daily as needed (dryness). Reported on 09/17/2015   Yes [provider]    sildenafil (VIAGRA) 100 MG tablet TAKE 1/2 TO 1 TABLET ONE HOUR BEFORE NEEDED Patient taking differently: Take 50-100 mg by mouth as needed for erectile dysfunction (one hour prior to need).  02/27/20  Yes Kathyrn Drown, MD  simethicone (MYLICON) 80 MG chewable tablet Chew 80 mg by mouth every 6 (six) hours as needed for flatulence.   Yes [provider]  Testosterone 20.25 MG/ACT (1.62%) GEL APPLY 2 PUMPS ONTO THE SKIN IN THE MORNING Patient taking differently: Apply 2 Pump topically in the morning.  02/21/20  Yes McKenzie, Candee Furbish, MD    Physical Exam: Vitals:   04/21/20 1600 04/21/20 1615 04/21/20 1630 04/21/20 1645  BP: 131/90  (!) 132/93   Pulse: 83 71 77 78  Resp: 20 15 16 17   Temp:      TempSrc:      SpO2: 98% 98% 98% 98%  Weight:      Height:        Constitutional: NAD, calm, comfortable Eyes: PERRL, lids and conjunctivae normal ENMT: Mucous membranes are moist. Posterior pharynx clear of any exudate or lesions.Normal dentition.  Neck: normal, supple, no masses, no thyromegaly Respiratory: clear to auscultation bilaterally, no wheezing, no crackles. Normal respiratory effort. No accessory muscle use.  Cardiovascular: Regular rate and rhythm, no murmurs / rubs / gallops. No extremity edema. 2+ pedal pulses. No carotid bruits.  Abdomen: soft, tender in upper abdomen, no masses palpated. No hepatosplenomegaly. Bowel sounds positive.  Musculoskeletal: no clubbing / cyanosis. No joint deformity upper and lower extremities. Good ROM, no contractures. Normal muscle tone.  Skin: no rashes, lesions, ulcers. No induration Neurologic: CN 2-12 grossly intact. Sensation intact, DTR normal. Strength 5/5 in all 4.  Psychiatric: Normal judgment and insight. Alert and oriented x 3. Normal mood.    Labs on Admission: I have personally reviewed following labs and imaging studies  CBC: Recent Labs  Lab 04/16/20 1336  WBC 14.3*  NEUTROABS 12.2*  HGB 17.4*  HCT 49.9  MCV  84.4  PLT 194   Basic Metabolic Panel: Recent Labs  Lab 04/16/20 1336 04/21/20 1256  NA 137 139  K 3.2* 3.1*  CL 105 108  CO2 21* 21*  GLUCOSE 165* 134*  BUN 25* 26*  CREATININE 0.82 0.86  CALCIUM 9.6 8.5*   GFR: Estimated Creatinine Clearance: 111.8 mL/min (by C-G formula based on SCr of 0.86 mg/dL). Liver Function Tests: Recent Labs  Lab 04/16/20 1336  AST 25  ALT 42  ALKPHOS 66  BILITOT 1.8*  PROT 7.7  ALBUMIN 4.2   Recent Labs  Lab 04/16/20 1336  LIPASE 17   No results for input(s): AMMONIA in the last 168 hours. Coagulation Profile: No results for input(s): INR, PROTIME in the last 168 hours. Cardiac Enzymes: No results for input(s): CKTOTAL, CKMB, CKMBINDEX, TROPONINI in the last 168 hours. BNP (last 3 results) No results for input(s): PROBNP in the last 8760 hours. HbA1C: No results for input(s): HGBA1C in the last 72 hours. CBG:  No results for input(s): GLUCAP in the last 168 hours. Lipid Profile: No results for input(s): CHOL, HDL, LDLCALC, TRIG, CHOLHDL, LDLDIRECT in the last 72 hours. Thyroid Function Tests: No results for input(s): TSH, T4TOTAL, FREET4, T3FREE, THYROIDAB in the last 72 hours. Anemia Panel: No results for input(s): VITAMINB12, FOLATE, FERRITIN, TIBC, IRON, RETICCTPCT in the last 72 hours. Urine analysis:    Component Value Date/Time   COLORURINE YELLOW 04/16/2020 1319   APPEARANCEUR HAZY (A) 04/16/2020 1319   LABSPEC 1.021 04/16/2020 1319   PHURINE 7.0 04/16/2020 1319   GLUCOSEU NEGATIVE 04/16/2020 1319   HGBUR NEGATIVE 04/16/2020 1319   BILIRUBINUR NEGATIVE 04/16/2020 1319   KETONESUR 5 (A) 04/16/2020 1319   PROTEINUR 30 (A) 04/16/2020 1319   UROBILINOGEN 0.2 05/19/2013 1732   NITRITE NEGATIVE 04/16/2020 1319   LEUKOCYTESUR NEGATIVE 04/16/2020 1319    Radiological Exams on Admission: No results found.  EKG: Independently reviewed. Sinus rhythm without acute changes  Assessment/Plan Active Problems:   Cyclic  vomiting syndrome   HTN (hypertension)   Narcotic dependency, continuous (HCC)   Abdominal migraine, intractable   Hypokalemia     Abdominal migraine, intractable with cyclical vomiting syndrome -Patient has had similar episode in the past -These episodes usually occur every 4 to 5 months per patient -Typically, he does not need to be hospitalized and treats them at home with Stadol -Unfortunately, patient had run out of Stadol -He is followed at [facility] neurology by Dr. Alverda Skeans -Dr. Theda Sers notes reviewed in care everywhere -I had treated patient for a similar episode in 06/2013 -At that time, he required frequent doses of Dilaudid until symptoms resolved, after which she discharged home -We will plan on admitting patient for pain management with Dilaudid and antiemetics. -We will also add Toradol -He has tried Imitrex in the past without benefit -Start on clear liquids  Hypertension -Continue lisinopril  Hypokalemia -Related to GI losses -Replace  Leukocytosis -Suspect this is related to hemoconcentration from dehydration -No signs of infection -Recheck in a.m. after hydration.  DVT prophylaxis: lovenox  Code Status: full code  Family Communication: discussed with wife at the bedside  Disposition Plan: discharge home once abdominal pain and vomiting has improved  Consults called:   Admission status: observation, medsurg   Kathie Dike MD [facility]   If 7PM-7AM, please contact night-coverage [email or web address]   04/21/2020, 7:50 PM

## 2020-04-21 NOTE — Telephone Encounter (Signed)
So there is no good answer with this Very diplomaticly let the patient know that there are limited places that do high-dose pain management. Most pain management places currently are doing prescribing only and will not do injections.  Some pain management centers will do injections but will not prescribe. Due to changes in our primary health care protocols that we have to follow our office  no longer prescribes high-dose pain management and therefore these are referred to pain management centers that do prescribe higher dose pain medicines.  There are very few places in [place] who do prescribe higher dose pain medicines.  Many places nowadays are not accepting new patients.  We can refer him to a different pain management center but I cannot guarantee that they will be accepting him. I wish there was pain management centers who are much more holistic in their care but these are becoming fewer and far between.  It is in the patient's best interest for him to contact his insurance company to see which pain management centers are covered potentially in the [facility] area such as [place].  If they are covered we will be happy to refer him to them.  It should be noted by the patient that many pain management places are restricting her pain medicines because of changes in federal law I will look into see if there are other places in [place] and will forward the patient some additional options that he could look into

## 2020-04-21 NOTE — ED Triage Notes (Signed)
Pt to er, pt states that he is here for an abdominal migraine, states that he was here about 5 days ago for the same, states that he is out of medication and is here to get the pain under control.  States that he hasn't started vomiting yet, but then it turns into cyclic vomiting.

## 2020-04-22 ENCOUNTER — Other Ambulatory Visit: Payer: Self-pay

## 2020-04-22 ENCOUNTER — Encounter (HOSPITAL_COMMUNITY): Payer: Self-pay | Admitting: Internal Medicine

## 2020-04-22 DIAGNOSIS — F1120 Opioid dependence, uncomplicated: Secondary | ICD-10-CM | POA: Diagnosis present

## 2020-04-22 DIAGNOSIS — E876 Hypokalemia: Secondary | ICD-10-CM | POA: Diagnosis present

## 2020-04-22 DIAGNOSIS — Z79899 Other long term (current) drug therapy: Secondary | ICD-10-CM | POA: Diagnosis not present

## 2020-04-22 DIAGNOSIS — G43A1 Cyclical vomiting, intractable: Secondary | ICD-10-CM | POA: Diagnosis present

## 2020-04-22 DIAGNOSIS — D72829 Elevated white blood cell count, unspecified: Secondary | ICD-10-CM | POA: Diagnosis present

## 2020-04-22 DIAGNOSIS — G43D1 Abdominal migraine, intractable: Secondary | ICD-10-CM | POA: Diagnosis present

## 2020-04-22 DIAGNOSIS — Z79890 Hormone replacement therapy (postmenopausal): Secondary | ICD-10-CM | POA: Diagnosis not present

## 2020-04-22 DIAGNOSIS — M549 Dorsalgia, unspecified: Secondary | ICD-10-CM | POA: Diagnosis present

## 2020-04-22 DIAGNOSIS — G4730 Sleep apnea, unspecified: Secondary | ICD-10-CM | POA: Diagnosis present

## 2020-04-22 DIAGNOSIS — Z20822 Contact with and (suspected) exposure to covid-19: Secondary | ICD-10-CM | POA: Diagnosis present

## 2020-04-22 DIAGNOSIS — E860 Dehydration: Secondary | ICD-10-CM | POA: Diagnosis present

## 2020-04-22 DIAGNOSIS — I10 Essential (primary) hypertension: Secondary | ICD-10-CM | POA: Diagnosis present

## 2020-04-22 DIAGNOSIS — R1084 Generalized abdominal pain: Secondary | ICD-10-CM | POA: Diagnosis not present

## 2020-04-22 DIAGNOSIS — Z791 Long term (current) use of non-steroidal anti-inflammatories (NSAID): Secondary | ICD-10-CM | POA: Diagnosis not present

## 2020-04-22 DIAGNOSIS — R1115 Cyclical vomiting syndrome unrelated to migraine: Secondary | ICD-10-CM | POA: Diagnosis present

## 2020-04-22 LAB — BASIC METABOLIC PANEL
Anion gap: 9 (ref 5–15)
BUN: 20 mg/dL (ref 6–20)
CO2: 25 mmol/L (ref 22–32)
Calcium: 8.5 mg/dL — ABNORMAL LOW (ref 8.9–10.3)
Chloride: 103 mmol/L (ref 98–111)
Creatinine, Ser: 0.71 mg/dL (ref 0.61–1.24)
GFR, Estimated: >60 mL/min (ref >60)
Glucose, Bld: 99 mg/dL (ref 70–99)
Potassium: 3.5 mmol/L (ref 3.5–5.1)
Sodium: 137 mmol/L (ref 135–145)

## 2020-04-22 LAB — CBC
HCT: 46.2 % (ref 39.0–52.0)
Hemoglobin: 15.4 g/dL (ref 13.0–17.0)
MCH: 29.8 pg (ref 26.0–34.0)
MCHC: 33.3 g/dL (ref 30.0–36.0)
MCV: 89.5 fL (ref 80.0–100.0)
Platelets: 308 10*3/uL (ref 150–400)
RBC: 5.16 MIL/uL (ref 4.22–5.81)
RDW: 12.9 % (ref 11.5–15.5)
WBC: 10.7 10*3/uL — ABNORMAL HIGH (ref 4.0–10.5)
nRBC: 0 % (ref 0.0–0.2)

## 2020-04-22 LAB — HIV ANTIBODY (ROUTINE TESTING W REFLEX): HIV Screen 4th Generation wRfx: NONREACTIVE

## 2020-04-22 MED ORDER — HYDROMORPHONE HCL 1 MG/ML IJ SOLN
2.0000 mg | Freq: Once | INTRAMUSCULAR | Status: AC
  Administered 2020-04-22: 2 mg via INTRAVENOUS
  Filled 2020-04-22: qty 2

## 2020-04-22 MED ORDER — HYDROMORPHONE HCL 1 MG/ML IJ SOLN
2.0000 mg | INTRAMUSCULAR | Status: DC | PRN
  Administered 2020-04-22 – 2020-04-25 (×8): 2 mg via INTRAVENOUS
  Filled 2020-04-22 (×8): qty 2

## 2020-04-22 NOTE — Progress Notes (Signed)
PROGRESS NOTE    Bryan Gilbert  KZL:[id number] DOB: 12-18-1965 DOA: 04/21/2020 PCP: Kathyrn Drown, MD    Brief Narrative:  HPI: Bryan Gilbert is a 54 y.o. male with medical history significant of abdominal migraines/cyclical vomiting syndrome, sleep apnea, presents to the hospital with abdominal pain and vomiting.  Patient symptoms started approximately 7 to 10 days ago.  His symptoms are typical for abdominal migraines which he usually tries to treat at home.  He is followed at [facility] neurology and usually takes Stadol to help resolve his abdominal migraine.  Unfortunately, he ran out of this medication.  He presented to the emergency room on 10/14 and was treated with intravenous pain medications and Zofran.  He felt mildly better and elected to go home.  Unfortunately his symptoms persisted.  He has been unable to keep anything down by mouth.  Symptoms appear to be worse in the mornings.  Patient has a long history of the same.  He has not had any fever, cough, shortness of breath, diarrhea, dysuria  ED Course: Patient received several doses of Dilaudid in the emergency room with some improvement of his symptoms.  He was noted to be hypokalemic, dehydrated and had a mild leukocytosis   Assessment & Plan:   Active Problems:   Cyclic vomiting syndrome   HTN (hypertension)   Narcotic dependency, continuous (HCC)   Abdominal migraine, intractable   Hypokalemia   Abdominal migraine, intractable with cyclical vomiting syndrome -Patient has had similar episode in the past -These episodes usually occur every 4 to 5 months per patient -Typically, he does not need to be hospitalized and treats them at home with Stadol -Unfortunately, patient had run out of Stadol -He is followed at [facility] neurology by Dr. Alverda Skeans, notes reviewed in care everywhere -He had very similar presentation to [facility] in 06/2013 -At that time, he required frequent doses of Dilaudid until symptoms resolved,  after which he was able to discharge home -He has tried Imitrex in the past without benefit -Continue pain management with Dilaudid, Toradol and antiemetics -Still seems to be having abdominal pain requiring IV pain medicine -Start on liquid diet and advance as tolerated  Hypertension -Continue lisinopril  Hypokalemia -Related to GI losses -Replaced  Leukocytosis -Suspect this is related to hemoconcentration from dehydration -No signs of infection -Improving with hydration   DVT prophylaxis: Lovenox  Code Status: full code Family Communication: discussed with patient Disposition Plan: Status is: Inpatient  Remains inpatient appropriate because:Ongoing active pain requiring inpatient pain management   Dispo: The patient is from: Home              Anticipated d/c is to: Home              Anticipated d/c date is: 1 day              Patient currently is not medically stable to d/c.   Consultants:     Procedures:     Antimicrobials:       Subjective: Feels that pain is mildly improved overnight.  He is worried that his pain will get worse after any p.o. intake.  Willing to try clear liquids today.  Symptoms are usually worse in the mornings.  Objective: Vitals:   04/21/20 1645 04/22/20 0555 04/22/20 0851 04/22/20 1358  BP:  124/74 (!) 114/94 (!) 130/99  Pulse: 78 80 70 70  Resp: 17  16 16   Temp:   98 F (36.7 C)  TempSrc:   Oral   SpO2: 98% 99% 99% 96%  Weight:      Height:        Intake/Output Summary (Last 24 hours) at 04/22/2020 1917 Last data filed at 04/22/2020 1359 Gross per 24 hour  Intake 480 ml  Output 300 ml  Net 180 ml   Filed Weights   04/21/20 1126  Weight: 99.8 kg    Examination:  General exam: Appears calm and comfortable  Respiratory system: Clear to auscultation. Respiratory effort normal. Cardiovascular system: S1 & S2 heard, RRR. No JVD, murmurs, rubs, gallops or clicks. No pedal edema. Gastrointestinal system: Abdomen  is nondistended, soft and tender in upper abdomen. No organomegaly or masses felt. Normal bowel sounds heard. Central nervous system: Alert and oriented. No focal neurological deficits. Extremities: Symmetric 5 x 5 power. Skin: No rashes, lesions or ulcers Psychiatry: Judgement and insight appear normal. Mood & affect appropriate.     Data Reviewed: I have personally reviewed following labs and imaging studies  CBC: Recent Labs  Lab 04/16/20 1336 04/22/20 0422  WBC 14.3* 10.7*  NEUTROABS 12.2*  --   HGB 17.4* 15.4  HCT 49.9 46.2  MCV 84.4 89.5  PLT 330 542   Basic Metabolic Panel: Recent Labs  Lab 04/16/20 1336 04/21/20 1256 04/22/20 0422  NA 137 139 137  K 3.2* 3.1* 3.5  CL 105 108 103  CO2 21* 21* 25  GLUCOSE 165* 134* 99  BUN 25* 26* 20  CREATININE 0.82 0.86 0.71  CALCIUM 9.6 8.5* 8.5*   GFR: Estimated Creatinine Clearance: 120.2 mL/min (by C-G formula based on SCr of 0.71 mg/dL). Liver Function Tests: Recent Labs  Lab 04/16/20 1336  AST 25  ALT 42  ALKPHOS 66  BILITOT 1.8*  PROT 7.7  ALBUMIN 4.2   Recent Labs  Lab 04/16/20 1336  LIPASE 17   No results for input(s): AMMONIA in the last 168 hours. Coagulation Profile: No results for input(s): INR, PROTIME in the last 168 hours. Cardiac Enzymes: No results for input(s): CKTOTAL, CKMB, CKMBINDEX, TROPONINI in the last 168 hours. BNP (last 3 results) No results for input(s): PROBNP in the last 8760 hours. HbA1C: No results for input(s): HGBA1C in the last 72 hours. CBG: No results for input(s): GLUCAP in the last 168 hours. Lipid Profile: No results for input(s): CHOL, HDL, LDLCALC, TRIG, CHOLHDL, LDLDIRECT in the last 72 hours. Thyroid Function Tests: No results for input(s): TSH, T4TOTAL, FREET4, T3FREE, THYROIDAB in the last 72 hours. Anemia Panel: No results for input(s): VITAMINB12, FOLATE, FERRITIN, TIBC, IRON, RETICCTPCT in the last 72 hours. Sepsis Labs: No results for input(s):  PROCALCITON, LATICACIDVEN in the last 168 hours.  Recent Results (from the past 240 hour(s))  Respiratory Panel by RT PCR (Flu A&B, Covid) - Nasopharyngeal Swab     Status: None   Collection Time: 04/21/20  2:40 PM   Specimen: Nasopharyngeal Swab  Result Value Ref Range Status   SARS Coronavirus 2 by RT PCR NEGATIVE NEGATIVE Final    Comment: (NOTE) SARS-CoV-2 target nucleic acids are NOT DETECTED.  The SARS-CoV-2 RNA is generally detectable in upper respiratoy specimens during the acute phase of infection. The lowest concentration of SARS-CoV-2 viral copies this assay can detect is 131 copies/mL. A negative result does not preclude SARS-Cov-2 infection and should not be used as the sole basis for treatment or other patient management decisions. A negative result may occur with  improper specimen collection/handling, submission of specimen other than nasopharyngeal swab,  presence of viral mutation(s) within the areas targeted by this assay, and inadequate number of viral copies (<131 copies/mL). A negative result must be combined with clinical observations, patient history, and epidemiological information. The expected result is Negative.  Fact Sheet for Patients:  PinkCheek.be  Fact Sheet for Healthcare Providers:  GravelBags.it  This test is no t yet approved or cleared by the [place] FDA and  has been authorized for detection and/or diagnosis of SARS-CoV-2 by FDA under an Emergency Use Authorization (EUA). This EUA will remain  in effect (meaning this test can be used) for the duration of the COVID-19 declaration under Section 564(b)(1) of the Act, 21 U.S.C. section 360bbb-3(b)(1), unless the authorization is terminated or revoked sooner.     Influenza A by PCR NEGATIVE NEGATIVE Final   Influenza B by PCR NEGATIVE NEGATIVE Final    Comment: (NOTE) The Xpert Xpress SARS-CoV-2/FLU/RSV assay is intended as an aid  in  the diagnosis of influenza from Nasopharyngeal swab specimens and  should not be used as a sole basis for treatment. Nasal washings and  aspirates are unacceptable for Xpert Xpress SARS-CoV-2/FLU/RSV  testing.  Fact Sheet for Patients: PinkCheek.be  Fact Sheet for Healthcare Providers: GravelBags.it  This test is not yet approved or cleared by the [place] FDA and  has been authorized for detection and/or diagnosis of SARS-CoV-2 by  FDA under an Emergency Use Authorization (EUA). This EUA will remain  in effect (meaning this test can be used) for the duration of the  Covid-19 declaration under Section 564(b)(1) of the Act, 21  U.S.C. section 360bbb-3(b)(1), unless the authorization is  terminated or revoked. Performed at [facility], [address], [place], [place] [address]          Radiology Studies: No results found.      Scheduled Meds:  enoxaparin (LOVENOX) injection  0.5 mg/kg Subcutaneous Q24H   ketorolac  30 mg Intravenous Q6H   pantoprazole (PROTONIX) IV  40 mg Intravenous Q12H   Continuous Infusions:   LOS: 0 days    Time spent: 23mins    Kathie Dike, MD [facility]   If 7PM-7AM, please contact night-coverage [email or web address]  04/22/2020, 7:17 PM

## 2020-04-22 NOTE — ED Notes (Signed)
Night shift RN reported to this RN that pt wanted to wait until breakfast to take his 0600 dose of Toradol because he normally takes it with breakfast.

## 2020-04-22 NOTE — Telephone Encounter (Signed)
Lmtc

## 2020-04-23 MED ORDER — HYDROMORPHONE HCL 1 MG/ML IJ SOLN
2.0000 mg | Freq: Once | INTRAMUSCULAR | Status: AC
  Administered 2020-04-23: 2 mg via INTRAVENOUS
  Filled 2020-04-23: qty 2

## 2020-04-23 MED ORDER — LISINOPRIL 10 MG PO TABS
10.0000 mg | ORAL_TABLET | Freq: Every day | ORAL | Status: DC
  Administered 2020-04-23: 10 mg via ORAL
  Filled 2020-04-23 (×2): qty 1

## 2020-04-23 MED ORDER — SODIUM CHLORIDE 0.9 % IV SOLN
Freq: Three times a day (TID) | INTRAVENOUS | Status: DC
  Filled 2020-04-23 (×8): qty 100

## 2020-04-23 MED ORDER — HALOPERIDOL LACTATE 5 MG/ML IJ SOLN
5.0000 mg | Freq: Once | INTRAMUSCULAR | Status: AC
  Filled 2020-04-23: qty 1

## 2020-04-23 MED ORDER — HYDRALAZINE HCL 20 MG/ML IJ SOLN
2.0000 mg | Freq: Once | INTRAMUSCULAR | Status: AC
  Administered 2020-04-23: 2 mg via INTRAVENOUS
  Filled 2020-04-23: qty 1

## 2020-04-23 MED ORDER — HALOPERIDOL LACTATE 5 MG/ML IJ SOLN: 5.0000 mg | Freq: Three times a day (TID) | INTRAMUSCULAR | Status: DC

## 2020-04-23 MED ORDER — MAGNESIUM SULFATE 4 GM/100ML IV SOLN
4.0000 g | Freq: Once | INTRAVENOUS | Status: AC
  Administered 2020-04-23: 4 g via INTRAVENOUS
  Filled 2020-04-23: qty 100

## 2020-04-23 NOTE — Progress Notes (Signed)
PROGRESS NOTE    Bryan Gilbert  XBJ:[id number] DOB: 04-Feb-1966 DOA: 04/21/2020 PCP: Kathyrn Drown, MD    Brief Narrative:  HPI: Bryan Gilbert is a 54 y.o. male with medical history significant of abdominal migraines/cyclical vomiting syndrome, sleep apnea, presents to the hospital with abdominal pain and vomiting.  Patient symptoms started approximately 7 to 10 days ago.  His symptoms are typical for abdominal migraines which he usually tries to treat at home.  He is followed at [facility] neurology and usually takes Stadol to help resolve his abdominal migraine.  Unfortunately, he ran out of this medication.  He presented to the emergency room on 10/14 and was treated with intravenous pain medications and Zofran.  He felt mildly better and elected to go home.  Unfortunately his symptoms persisted.  He has been unable to keep anything down by mouth.  Symptoms appear to be worse in the mornings.  Patient has a long history of the same.  He has not had any fever, cough, shortness of breath, diarrhea, dysuria  ED Course: Patient received several doses of Dilaudid in the emergency room with some improvement of his symptoms.  He was noted to be hypokalemic, dehydrated and had a mild leukocytosis   Assessment & Plan:   Active Problems:   Cyclic vomiting syndrome   HTN (hypertension)   Narcotic dependency, continuous (HCC)   Abdominal migraine, intractable   Hypokalemia   Abdominal migraine, intractable with cyclical vomiting syndrome -Patient has had similar episodes in the past -These episodes usually occur every 4 to 5 months per patient -Typically, he does not need to be hospitalized and treats them at home with Stadol -Unfortunately, patient had run out of Stadol -He is followed at [facility] neurology by Dr. Alverda Skeans, notes reviewed in care everywhere -He had very similar presentation to [facility] in 06/2013 -At that time, he required frequent doses of Dilaudid until symptoms resolved,  after which he was able to discharge home in a relatively short time period -He has tried Imitrex in the past without benefit -I discussed his case with his primary neurologist Dr. Theda Sers -Recommendations for IV magnesium infusion -also recommended haldol 5mg  IV TID administered in 100cc of saline -Agreed with continuing pain management, but advised to start tapering back opiates, since they can contribute to nausea symptoms -Dr. Theda Sers wants patients to follow up with him after discharge so that further treatments can be discussed, such as replacing stadol therapy with intranasal ketamine -Discussed if there is a need for inpatient transfer to [place], and it not felt to be indicated at this time. -continue on full liquid diet, would not advance further in the hospital.  Hypertension -Continue lisinopril  Hypokalemia -Related to GI losses -Replaced  Leukocytosis -Suspect this is related to hemoconcentration from dehydration -No signs of infection -Improving with hydration   DVT prophylaxis: Lovenox  Code Status: full code Family Communication: discussed with patient Disposition Plan: Status is: Inpatient  Remains inpatient appropriate because:Ongoing active pain requiring inpatient pain management   Dispo: The patient is from: Home              Anticipated d/c is to: Home              Anticipated d/c date is: 1 day              Patient currently is not medically stable to d/c.   Consultants:     Procedures:     Antimicrobials:  Subjective: Continues to have pain in upper abdomen. This is worse in the morning. It is not always related to po intake.   Objective: Vitals:   04/22/20 1358 04/22/20 2107 04/23/20 0106 04/23/20 0622  BP: (!) 130/99 (!) 133/94 (!) 137/96 (!) 118/95  Pulse: 70 79 82 74  Resp: 16 15 16 15   Temp:  98.9 F (37.2 C)  98.2 F (36.8 C)  TempSrc:      SpO2: 96% 100% 99% 100%  Weight:      Height:       No intake or output  data in the 24 hours ending 04/23/20 1805 Filed Weights   04/21/20 1126  Weight: 99.8 kg    Examination:  General exam: Alert, awake, oriented x 3 Respiratory system: Clear to auscultation. Respiratory effort normal. Cardiovascular system:RRR. No murmurs, rubs, gallops. Gastrointestinal system: Abdomen is nondistended, soft and tender in upper abdomen. No organomegaly or masses felt. Normal bowel sounds heard. Central nervous system: Alert and oriented. No focal neurological deficits. Extremities: No C/C/E, +pedal pulses Skin: No rashes, lesions or ulcers Psychiatry: Judgement and insight appear normal. Mood & affect appropriate.     Data Reviewed: I have personally reviewed following labs and imaging studies  CBC: Recent Labs  Lab 04/22/20 0422  WBC 10.7*  HGB 15.4  HCT 46.2  MCV 89.5  PLT 694   Basic Metabolic Panel: Recent Labs  Lab 04/21/20 1256 04/22/20 0422  NA 139 137  K 3.1* 3.5  CL 108 103  CO2 21* 25  GLUCOSE 134* 99  BUN 26* 20  CREATININE 0.86 0.71  CALCIUM 8.5* 8.5*   GFR: Estimated Creatinine Clearance: 120.2 mL/min (by C-G formula based on SCr of 0.71 mg/dL). Liver Function Tests: No results for input(s): AST, ALT, ALKPHOS, BILITOT, PROT, ALBUMIN in the last 168 hours. No results for input(s): LIPASE, AMYLASE in the last 168 hours. No results for input(s): AMMONIA in the last 168 hours. Coagulation Profile: No results for input(s): INR, PROTIME in the last 168 hours. Cardiac Enzymes: No results for input(s): CKTOTAL, CKMB, CKMBINDEX, TROPONINI in the last 168 hours. BNP (last 3 results) No results for input(s): PROBNP in the last 8760 hours. HbA1C: No results for input(s): HGBA1C in the last 72 hours. CBG: No results for input(s): GLUCAP in the last 168 hours. Lipid Profile: No results for input(s): CHOL, HDL, LDLCALC, TRIG, CHOLHDL, LDLDIRECT in the last 72 hours. Thyroid Function Tests: No results for input(s): TSH, T4TOTAL, FREET4,  T3FREE, THYROIDAB in the last 72 hours. Anemia Panel: No results for input(s): VITAMINB12, FOLATE, FERRITIN, TIBC, IRON, RETICCTPCT in the last 72 hours. Sepsis Labs: No results for input(s): PROCALCITON, LATICACIDVEN in the last 168 hours.  Recent Results (from the past 240 hour(s))  Respiratory Panel by RT PCR (Flu A&B, Covid) - Nasopharyngeal Swab     Status: None   Collection Time: 04/21/20  2:40 PM   Specimen: Nasopharyngeal Swab  Result Value Ref Range Status   SARS Coronavirus 2 by RT PCR NEGATIVE NEGATIVE Final    Comment: (NOTE) SARS-CoV-2 target nucleic acids are NOT DETECTED.  The SARS-CoV-2 RNA is generally detectable in upper respiratoy specimens during the acute phase of infection. The lowest concentration of SARS-CoV-2 viral copies this assay can detect is 131 copies/mL. A negative result does not preclude SARS-Cov-2 infection and should not be used as the sole basis for treatment or other patient management decisions. A negative result may occur with  improper specimen collection/handling, submission of specimen  other than nasopharyngeal swab, presence of viral mutation(s) within the areas targeted by this assay, and inadequate number of viral copies (<131 copies/mL). A negative result must be combined with clinical observations, patient history, and epidemiological information. The expected result is Negative.  Fact Sheet for Patients:  PinkCheek.be  Fact Sheet for Healthcare Providers:  GravelBags.it  This test is no t yet approved or cleared by the [place] FDA and  has been authorized for detection and/or diagnosis of SARS-CoV-2 by FDA under an Emergency Use Authorization (EUA). This EUA will remain  in effect (meaning this test can be used) for the duration of the COVID-19 declaration under Section 564(b)(1) of the Act, 21 U.S.C. section 360bbb-3(b)(1), unless the authorization is terminated  or revoked sooner.     Influenza A by PCR NEGATIVE NEGATIVE Final   Influenza B by PCR NEGATIVE NEGATIVE Final    Comment: (NOTE) The Xpert Xpress SARS-CoV-2/FLU/RSV assay is intended as an aid in  the diagnosis of influenza from Nasopharyngeal swab specimens and  should not be used as a sole basis for treatment. Nasal washings and  aspirates are unacceptable for Xpert Xpress SARS-CoV-2/FLU/RSV  testing.  Fact Sheet for Patients: PinkCheek.be  Fact Sheet for Healthcare Providers: GravelBags.it  This test is not yet approved or cleared by the [place] FDA and  has been authorized for detection and/or diagnosis of SARS-CoV-2 by  FDA under an Emergency Use Authorization (EUA). This EUA will remain  in effect (meaning this test can be used) for the duration of the  Covid-19 declaration under Section 564(b)(1) of the Act, 21  U.S.C. section 360bbb-3(b)(1), unless the authorization is  terminated or revoked. Performed at [facility], [address] [place], [place] [address]          Radiology Studies: No results found.      Scheduled Meds: . enoxaparin (LOVENOX) injection  0.5 mg/kg Subcutaneous Q24H  . haloperidol lactate  5 mg Intravenous TID  . ketorolac  30 mg Intravenous Q6H  . pantoprazole (PROTONIX) IV  40 mg Intravenous Q12H   Continuous Infusions: . magnesium sulfate bolus IVPB       LOS: 1 day    Time spent: 51mins    Kathie Dike, MD [facility]   If 7PM-7AM, please contact night-coverage [email or web address]  04/23/2020, 6:05 PM

## 2020-04-24 DIAGNOSIS — G43D1 Abdominal migraine, intractable: Principal | ICD-10-CM

## 2020-04-24 MED ORDER — LOPERAMIDE HCL 2 MG PO CAPS
2.0000 mg | ORAL_CAPSULE | ORAL | Status: DC | PRN
  Administered 2020-04-24: 2 mg via ORAL
  Filled 2020-04-24: qty 1

## 2020-04-24 NOTE — Progress Notes (Signed)
Patient was given PRN dilaudid for pain control. Pain was verbalized to be 7. One hour later a second dose was given because pain was not subsided. Additional dose was allowed X 1. Patient is comfortable, alert and not in distress.

## 2020-04-24 NOTE — Progress Notes (Signed)
PROGRESS NOTE    Bryan Gilbert  GMW:[id number] DOB: 1966/02/10 DOA: 04/21/2020 PCP: Kathyrn Drown, MD   Brief Narrative:  YQI:[id number] L Wortis a 54 y.o.malewith medical history significant ofabdominal migraines/cyclical vomiting syndrome, sleep apnea, presents to the hospital with abdominal pain and vomiting. Patient symptoms started approximately 7 to 10 days ago. His symptoms are typical for abdominal migraines which he usually tries to treat at home. He is followed at [facility] neurology and usually takes Stadol to help resolve his abdominal migraine. Unfortunately, he ran out of this medication. He presented to the emergency room on 10/14 and was treated with intravenous pain medications and Zofran. He felt mildly better and elected to go home. Unfortunately his symptoms persisted. He has been unable to keep anything down by mouth. Symptoms appear to be worse in the mornings. Patient has a long history of the same. He has not had any fever, cough, shortness of breath, diarrhea, dysuria  ED Course:Patient received several doses of Dilaudid in the emergency room with some improvement of his symptoms. He was noted to be hypokalemic, dehydrated and had a mild leukocytosis  10/20: Patient has been admitted with abdominal migraine as well as intractable nausea and vomiting with cyclical vomiting syndrome.  He continues to require IV pain management for relief.  He is trying to reach out to his neurologist at [facility] to assist him with follow-up in the very near future.  Assessment & Plan:   Active Problems:   Cyclic vomiting syndrome   HTN (hypertension)   Narcotic dependency, continuous (HCC)   Abdominal migraine, intractable   Hypokalemia  Abdominal migraine, intractable with cyclical vomiting syndrome -Patient has had similar episodes in the past -These episodes usually occur every 4 to 5 months per patient -Typically, he does not need to be hospitalized and treats them at home  with Stadol -Unfortunately, patient had run out of Stadol -He is followed at [facility] neurology by Dr. Alverda Skeans, notes reviewed in care everywhere -He had very similar presentation to [facility] in 06/2013 -At that time, he required frequent doses of Dilaudid until symptoms resolved,after which he was able to discharge home in a relatively short time period -He has tried Imitrex in the past without benefit -I discussed his case with his primary neurologist Dr. Theda Sers -Recommendations for IV magnesium infusion as needed. -also recommended haldol 5mg  IV TID administered in 100cc of saline -Agreed with continuing pain management, but advised to start tapering back opiates, since they can contribute to nausea symptoms -Dr. Theda Sers wants patients to follow up with him after discharge so that further treatments can be discussed, such as replacing stadol therapy with intranasal ketamine.  Patient is trying to contact his neurologist for close follow-up visit. -Discussed if there is a need for inpatient transfer to [facility], and it is not felt to be indicated at this time. -continue on full liquid diet, would not advance further in the hospital.  Hypertension -Continue lisinopril  Hypokalemia-resolved -Related to GI losses -Replaced -Recheck in a.m.  Leukocytosis-improving -Suspect this is related to hemoconcentration from dehydration -No signs of infection -Improving with hydration -Recheck in a.m.  DVT prophylaxis: Lovenox  Code Status: full code Family Communication: discussed with patient, patient states he will call his son Disposition Plan: Status is: Inpatient  Remains inpatient appropriate because:Ongoing active pain requiring inpatient pain management   Dispo: The patient is from: Home  Anticipated d/c is to: Home  Anticipated d/c date is: 1 day  Patient currently is  not medically stable to d/c.  He is still having ongoing  abdominal symptoms requiring IV pain medications for management.   Consultants:   None  Procedures:   None  Antimicrobials:    None  Subjective: Patient seen and evaluated today with ongoing, severe abdominal pain requiring IV pain medications.  He otherwise appears to have slept overnight.  He states he did have a bowel movement yesterday evening.  No nausea or vomiting described.  Objective: Vitals:   04/23/20 2102 04/23/20 2103 04/24/20 0527 04/24/20 1435  BP: (!) 124/113  122/79 108/81  Pulse: 92  72 79  Resp: 16  16 17   Temp: 98.1 F (36.7 C)  98.2 F (36.8 C) 97.9 F (36.6 C)  TempSrc: Oral  Oral Oral  SpO2: 96% 93% 96% 97%  Weight:      Height:        Intake/Output Summary (Last 24 hours) at 04/24/2020 1505 Last data filed at 04/24/2020 0400 Gross per 24 hour  Intake 57.43 ml  Output --  Net 57.43 ml   Filed Weights   04/21/20 1126  Weight: 99.8 kg    Examination:  General exam: Appears calm and comfortable  Respiratory system: Clear to auscultation. Respiratory effort normal. Cardiovascular system: S1 & S2 heard, RRR.  Gastrointestinal system: Abdomen is nondistended, soft and minimally  tender.  Central nervous system: Alert and oriented. No focal neurological deficits. Extremities: No edema Skin: No rashes, lesions or ulcers Psychiatry: Judgement and insight appear normal. Mood & affect appropriate.     Data Reviewed: I have personally reviewed following labs and imaging studies  CBC: Recent Labs  Lab 04/22/20 0422  WBC 10.7*  HGB 15.4  HCT 46.2  MCV 89.5  PLT 353   Basic Metabolic Panel: Recent Labs  Lab 04/21/20 1256 04/22/20 0422  NA 139 137  K 3.1* 3.5  CL 108 103  CO2 21* 25  GLUCOSE 134* 99  BUN 26* 20  CREATININE 0.86 0.71  CALCIUM 8.5* 8.5*   GFR: Estimated Creatinine Clearance: 120.2 mL/min (by C-G formula based on SCr of 0.71 mg/dL). Liver Function Tests: No results for input(s): AST, ALT, ALKPHOS,  BILITOT, PROT, ALBUMIN in the last 168 hours. No results for input(s): LIPASE, AMYLASE in the last 168 hours. No results for input(s): AMMONIA in the last 168 hours. Coagulation Profile: No results for input(s): INR, PROTIME in the last 168 hours. Cardiac Enzymes: No results for input(s): CKTOTAL, CKMB, CKMBINDEX, TROPONINI in the last 168 hours. BNP (last 3 results) No results for input(s): PROBNP in the last 8760 hours. HbA1C: No results for input(s): HGBA1C in the last 72 hours. CBG: No results for input(s): GLUCAP in the last 168 hours. Lipid Profile: No results for input(s): CHOL, HDL, LDLCALC, TRIG, CHOLHDL, LDLDIRECT in the last 72 hours. Thyroid Function Tests: No results for input(s): TSH, T4TOTAL, FREET4, T3FREE, THYROIDAB in the last 72 hours. Anemia Panel: No results for input(s): VITAMINB12, FOLATE, FERRITIN, TIBC, IRON, RETICCTPCT in the last 72 hours. Sepsis Labs: No results for input(s): PROCALCITON, LATICACIDVEN in the last 168 hours.  Recent Results (from the past 240 hour(s))  Respiratory Panel by RT PCR (Flu A&B, Covid) - Nasopharyngeal Swab     Status: None   Collection Time: 04/21/20  2:40 PM   Specimen: Nasopharyngeal Swab  Result Value Ref Range Status   SARS Coronavirus 2 by RT PCR NEGATIVE NEGATIVE Final    Comment: (NOTE) SARS-CoV-2 target nucleic acids are NOT DETECTED.  The SARS-CoV-2  RNA is generally detectable in upper respiratoy specimens during the acute phase of infection. The lowest concentration of SARS-CoV-2 viral copies this assay can detect is 131 copies/mL. A negative result does not preclude SARS-Cov-2 infection and should not be used as the sole basis for treatment or other patient management decisions. A negative result may occur with  improper specimen collection/handling, submission of specimen other than nasopharyngeal swab, presence of viral mutation(s) within the areas targeted by this assay, and inadequate number of viral  copies (<131 copies/mL). A negative result must be combined with clinical observations, patient history, and epidemiological information. The expected result is Negative.  Fact Sheet for Patients:  PinkCheek.be  Fact Sheet for Healthcare Providers:  GravelBags.it  This test is no t yet approved or cleared by the [place] FDA and  has been authorized for detection and/or diagnosis of SARS-CoV-2 by FDA under an Emergency Use Authorization (EUA). This EUA will remain  in effect (meaning this test can be used) for the duration of the COVID-19 declaration under Section 564(b)(1) of the Act, 21 U.S.C. section 360bbb-3(b)(1), unless the authorization is terminated or revoked sooner.     Influenza A by PCR NEGATIVE NEGATIVE Final   Influenza B by PCR NEGATIVE NEGATIVE Final    Comment: (NOTE) The Xpert Xpress SARS-CoV-2/FLU/RSV assay is intended as an aid in  the diagnosis of influenza from Nasopharyngeal swab specimens and  should not be used as a sole basis for treatment. Nasal washings and  aspirates are unacceptable for Xpert Xpress SARS-CoV-2/FLU/RSV  testing.  Fact Sheet for Patients: PinkCheek.be  Fact Sheet for Healthcare Providers: GravelBags.it  This test is not yet approved or cleared by the [place] FDA and  has been authorized for detection and/or diagnosis of SARS-CoV-2 by  FDA under an Emergency Use Authorization (EUA). This EUA will remain  in effect (meaning this test can be used) for the duration of the  Covid-19 declaration under Section 564(b)(1) of the Act, 21  U.S.C. section 360bbb-3(b)(1), unless the authorization is  terminated or revoked. Performed at [facility], [address], [place], [place] [address]          Radiology Studies: No results found.      Scheduled Meds:  enoxaparin (LOVENOX) injection  0.5 mg/kg  Subcutaneous Q24H   ketorolac  30 mg Intravenous Q6H   lisinopril  10 mg Oral Daily   pantoprazole (PROTONIX) IV  40 mg Intravenous Q12H   Continuous Infusions:  haloperidol lactate (HALDOL) 5mg  in NS 126mL infusion 50 mL/hr at 04/24/20 1225     LOS: 2 days    Time spent: 35 minutes    Stephaun Million Darleen Crocker, DO [facility]  If 7PM-7AM, please contact night-coverage [email or web address] 04/24/2020, 3:05 PM

## 2020-04-25 DIAGNOSIS — R1115 Cyclical vomiting syndrome unrelated to migraine: Secondary | ICD-10-CM

## 2020-04-25 NOTE — Discharge Summary (Signed)
Physician Discharge Summary  Bryan Gilbert PPI:[id number] DOB: 1966/07/03 DOA: 04/21/2020  PCP: Kathyrn Drown, MD  Admit date: 04/21/2020  Discharge date: 04/25/2020  Admitted From:Home  Disposition:  Home  Recommendations for Outpatient Follow-up:  1. Follow up with PCP in 1-2 weeks 2. Follow-up with [facility] neurology Dr. Theda Sers as scheduled at 4 PM on 10/21 for further medical management 3. Continue home medications as prior  Home Health: None  Equipment/Devices: None  Discharge Condition: Stable  CODE STATUS: Full  Diet recommendation: Full liquid diet until it can be further advanced  Brief/Interim Summary: Bryan Gilbert:Bryan Gilbert L Bryan Gilbert a 54 y.o.malewith medical history significant ofabdominal migraines/cyclical vomiting syndrome, sleep apnea, presents to the hospital with abdominal pain and vomiting. Patient symptoms started approximately 7 to 10 days ago. His symptoms are typical for abdominal migraines which he usually tries to treat at home. He is followed at [facility] neurology and usually takes Stadol to help resolve his abdominal migraine. Unfortunately, he ran out of this medication. He presented to the emergency room on 10/14 and was treated with intravenous pain medications and Zofran. He felt mildly better and elected to go home. Unfortunately his symptoms persisted. He has been unable to keep anything down by mouth. Symptoms appear to be worse in the mornings. Patient has a long history of the same. He has not had any fever, cough, shortness of breath, diarrhea, dysuria  ED Course:Patient received several doses of Dilaudid in the emergency room with some improvement of his symptoms. He was noted to be hypokalemic, dehydrated and had a mild leukocytosis  10/20: Patient has been admitted with abdominal migraine as well as intractable nausea and vomiting with cyclical vomiting syndrome.  He continues to require IV pain management for relief.  He is trying to reach out  to his neurologist at [facility] to assist him with follow-up in the very near future.  10/21: Patient is feeling much better on day of discharge and can tolerate diet and is without abdominal pain over the last 24 hours and has not needed IV medications.  He is overall stable for discharge and will follow up with neurology at [facility] as scheduled at 4 PM today with his neurologist Dr. Theda Sers.  Discharge Diagnoses:  Active Problems:   Cyclic vomiting syndrome   HTN (hypertension)   Narcotic dependency, continuous (HCC)   Abdominal migraine, intractable   Hypokalemia  Principal discharge diagnosis: Abdominal migraine with intractable cyclical vomiting syndrome.  Discharge Instructions  Discharge Instructions    Diet - low sodium heart healthy   Complete by: As directed    Increase activity slowly   Complete by: As directed      Allergies as of 04/25/2020      Reactions   Cefprozil Diarrhea, Nausea Only   Intestinal upset and diarrhea   Lipitor [atorvastatin] Other (See Comments)   Myalgia   Xtandi [enzalutamide]    GI upset      Medication List    TAKE these medications   butorphanol 10 MG/ML nasal spray Commonly known as: STADOL Place 1 spray into the nose every 4 (four) hours as needed for headache or migraine.   cyclobenzaprine 10 MG tablet Commonly known as: FLEXERIL Take 10 mg by mouth 3 (three) times daily as needed for muscle spasms.   HYDROcodone-acetaminophen 10-325 MG tablet Commonly known as: NORCO Take 1 tablet by mouth every 4 (four) hours as needed for moderate pain.   lisinopril-hydrochlorothiazide 10-12.5 MG tablet Commonly known as: ZESTORETIC Take 1 tablet  by mouth daily.   loperamide 2 MG capsule Commonly known as: IMODIUM Take 2 mg by mouth as needed for diarrhea or loose stools.   meloxicam 15 MG tablet Commonly known as: MOBIC Take 1 tablet (15 mg total) by mouth daily.   SALINE NASAL SPRAY NA Place 1 spray into the nose daily as needed  (dryness). Reported on 09/17/2015   sildenafil 100 MG tablet Commonly known as: VIAGRA TAKE 1/2 TO 1 TABLET ONE HOUR BEFORE NEEDED What changed:   how much to take  how to take this  when to take this  reasons to take this  additional instructions   simethicone 80 MG chewable tablet Commonly known as: MYLICON Chew 80 mg by mouth every 6 (six) hours as needed for flatulence.   Testosterone 20.25 MG/ACT (1.62%) Gel APPLY 2 PUMPS ONTO THE SKIN IN THE MORNING What changed:   how much to take  how to take this  when to take this  additional instructions       Follow-up Information    Luking, Elayne Snare, MD Follow up in 1 week(s).   Specialty: Family Medicine Contact information: [address] [place] [address] [phone]        Neurology Dr. Theda Sers. Go to.   Why: Appointment with Neurology at [facility] 10/21 4p             Allergies  Allergen Reactions  . Cefprozil Diarrhea and Nausea Only    Intestinal upset and diarrhea  . Lipitor [Atorvastatin] Other (See Comments)    Myalgia  . Xtandi [Enzalutamide]     GI upset    Consultations:  None   Procedures/Studies:  No results found.   Discharge Exam: Vitals:   04/24/20 2139 04/25/20 0508  BP:  (!) 138/93  Pulse:  76  Resp:  18  Temp:  97.9 F (36.6 C)  SpO2: 95% 98%   Vitals:   04/24/20 1435 04/24/20 2121 04/24/20 2139 04/25/20 0508  BP: 108/81 133/80  (!) 138/93  Pulse: 79 83  76  Resp: 17 16  18   Temp: 97.9 F (36.6 C) 98.5 F (36.9 C)  97.9 F (36.6 C)  TempSrc: Oral Oral  Oral  SpO2: 97% 98% 95% 98%  Weight:      Height:        General: Pt is alert, awake, not in acute distress Cardiovascular: RRR, S1/S2 +, no rubs, no gallops Respiratory: CTA bilaterally, no wheezing, no rhonchi Abdominal: Soft, NT, ND, bowel sounds + Extremities: no edema, no cyanosis    The results of significant diagnostics from this hospitalization (including imaging, microbiology,  ancillary and laboratory) are listed below for reference.     Microbiology: Recent Results (from the past 240 hour(s))  Respiratory Panel by RT PCR (Flu A&B, Covid) - Nasopharyngeal Swab     Status: None   Collection Time: 04/21/20  2:40 PM   Specimen: Nasopharyngeal Swab  Result Value Ref Range Status   SARS Coronavirus 2 by RT PCR NEGATIVE NEGATIVE Final    Comment: (NOTE) SARS-CoV-2 target nucleic acids are NOT DETECTED.  The SARS-CoV-2 RNA is generally detectable in upper respiratoy specimens during the acute phase of infection. The lowest concentration of SARS-CoV-2 viral copies this assay can detect is 131 copies/mL. A negative result does not preclude SARS-Cov-2 infection and should not be used as the sole basis for treatment or other patient management decisions. A negative result may occur with  improper specimen collection/handling, submission of specimen  other than nasopharyngeal swab, presence of viral mutation(s) within the areas targeted by this assay, and inadequate number of viral copies (<131 copies/mL). A negative result must be combined with clinical observations, patient history, and epidemiological information. The expected result is Negative.  Fact Sheet for Patients:  PinkCheek.be  Fact Sheet for Healthcare Providers:  GravelBags.it  This test is no t yet approved or cleared by the [place] FDA and  has been authorized for detection and/or diagnosis of SARS-CoV-2 by FDA under an Emergency Use Authorization (EUA). This EUA will remain  in effect (meaning this test can be used) for the duration of the COVID-19 declaration under Section 564(b)(1) of the Act, 21 U.S.C. section 360bbb-3(b)(1), unless the authorization is terminated or revoked sooner.     Influenza A by PCR NEGATIVE NEGATIVE Final   Influenza B by PCR NEGATIVE NEGATIVE Final    Comment: (NOTE) The Xpert Xpress SARS-CoV-2/FLU/RSV  assay is intended as an aid in  the diagnosis of influenza from Nasopharyngeal swab specimens and  should not be used as a sole basis for treatment. Nasal washings and  aspirates are unacceptable for Xpert Xpress SARS-CoV-2/FLU/RSV  testing.  Fact Sheet for Patients: PinkCheek.be  Fact Sheet for Healthcare Providers: GravelBags.it  This test is not yet approved or cleared by the [place] FDA and  has been authorized for detection and/or diagnosis of SARS-CoV-2 by  FDA under an Emergency Use Authorization (EUA). This EUA will remain  in effect (meaning this test can be used) for the duration of the  Covid-19 declaration under Section 564(b)(1) of the Act, 21  U.S.C. section 360bbb-3(b)(1), unless the authorization is  terminated or revoked. Performed at [facility], [address] [place], [place] [address]      Labs: BNP (last 3 results) No results for input(s): BNP in the last 8760 hours. Basic Metabolic Panel: Recent Labs  Lab 04/21/20 1256 04/22/20 0422  NA 139 137  K 3.1* 3.5  CL 108 103  CO2 21* 25  GLUCOSE 134* 99  BUN 26* 20  CREATININE 0.86 0.71  CALCIUM 8.5* 8.5*   Liver Function Tests: No results for input(s): AST, ALT, ALKPHOS, BILITOT, PROT, ALBUMIN in the last 168 hours. No results for input(s): LIPASE, AMYLASE in the last 168 hours. No results for input(s): AMMONIA in the last 168 hours. CBC: Recent Labs  Lab 04/22/20 0422  WBC 10.7*  HGB 15.4  HCT 46.2  MCV 89.5  PLT 308   Cardiac Enzymes: No results for input(s): CKTOTAL, CKMB, CKMBINDEX, TROPONINI in the last 168 hours. BNP: Invalid input(s): POCBNP CBG: No results for input(s): GLUCAP in the last 168 hours. D-Dimer No results for input(s): DDIMER in the last 72 hours. Hgb A1c No results for input(s): HGBA1C in the last 72 hours. Lipid Profile No results for input(s): CHOL, HDL, LDLCALC, TRIG, CHOLHDL, LDLDIRECT in the last  72 hours. Thyroid function studies No results for input(s): TSH, T4TOTAL, T3FREE, THYROIDAB in the last 72 hours.  Invalid input(s): FREET3 Anemia work up No results for input(s): VITAMINB12, FOLATE, FERRITIN, TIBC, IRON, RETICCTPCT in the last 72 hours. Urinalysis    Component Value Date/Time   COLORURINE YELLOW 04/16/2020 1319   APPEARANCEUR HAZY (A) 04/16/2020 1319   LABSPEC 1.021 04/16/2020 1319   PHURINE 7.0 04/16/2020 1319   GLUCOSEU NEGATIVE 04/16/2020 1319   HGBUR NEGATIVE 04/16/2020 1319   BILIRUBINUR NEGATIVE 04/16/2020 1319   KETONESUR 5 (A) 04/16/2020 1319   PROTEINUR 30 (A) 04/16/2020 1319  UROBILINOGEN 0.2 05/19/2013 1732   NITRITE NEGATIVE 04/16/2020 1319   LEUKOCYTESUR NEGATIVE 04/16/2020 1319   Sepsis Labs Invalid input(s): PROCALCITONIN,  WBC,  LACTICIDVEN Microbiology Recent Results (from the past 240 hour(s))  Respiratory Panel by RT PCR (Flu A&B, Covid) - Nasopharyngeal Swab     Status: None   Collection Time: 04/21/20  2:40 PM   Specimen: Nasopharyngeal Swab  Result Value Ref Range Status   SARS Coronavirus 2 by RT PCR NEGATIVE NEGATIVE Final    Comment: (NOTE) SARS-CoV-2 target nucleic acids are NOT DETECTED.  The SARS-CoV-2 RNA is generally detectable in upper respiratoy specimens during the acute phase of infection. The lowest concentration of SARS-CoV-2 viral copies this assay can detect is 131 copies/mL. A negative result does not preclude SARS-Cov-2 infection and should not be used as the sole basis for treatment or other patient management decisions. A negative result may occur with  improper specimen collection/handling, submission of specimen other than nasopharyngeal swab, presence of viral mutation(s) within the areas targeted by this assay, and inadequate number of viral copies (<131 copies/mL). A negative result must be combined with clinical observations, patient history, and epidemiological information. The expected result is  Negative.  Fact Sheet for Patients:  PinkCheek.be  Fact Sheet for Healthcare Providers:  GravelBags.it  This test is no t yet approved or cleared by the [place] FDA and  has been authorized for detection and/or diagnosis of SARS-CoV-2 by FDA under an Emergency Use Authorization (EUA). This EUA will remain  in effect (meaning this test can be used) for the duration of the COVID-19 declaration under Section 564(b)(1) of the Act, 21 U.S.C. section 360bbb-3(b)(1), unless the authorization is terminated or revoked sooner.     Influenza A by PCR NEGATIVE NEGATIVE Final   Influenza B by PCR NEGATIVE NEGATIVE Final    Comment: (NOTE) The Xpert Xpress SARS-CoV-2/FLU/RSV assay is intended as an aid in  the diagnosis of influenza from Nasopharyngeal swab specimens and  should not be used as a sole basis for treatment. Nasal washings and  aspirates are unacceptable for Xpert Xpress SARS-CoV-2/FLU/RSV  testing.  Fact Sheet for Patients: PinkCheek.be  Fact Sheet for Healthcare Providers: GravelBags.it  This test is not yet approved or cleared by the [place] FDA and  has been authorized for detection and/or diagnosis of SARS-CoV-2 by  FDA under an Emergency Use Authorization (EUA). This EUA will remain  in effect (meaning this test can be used) for the duration of the  Covid-19 declaration under Section 564(b)(1) of the Act, 21  U.S.C. section 360bbb-3(b)(1), unless the authorization is  terminated or revoked. Performed at [facility], [address], [place], [place] [address]      Time coordinating discharge: 35 minutes  SIGNED:   Rodena Goldmann, DO [facility] 04/25/2020, 7:43 AM  If 7PM-7AM, please contact night-coverage [email or web address]

## 2020-04-27 ENCOUNTER — Encounter (HOSPITAL_COMMUNITY): Payer: Self-pay | Admitting: Emergency Medicine

## 2020-04-27 ENCOUNTER — Emergency Department (HOSPITAL_COMMUNITY)
Admission: EM | Admit: 2020-04-27 | Discharge: 2020-04-27 | Disposition: A | Discharge status: Home / Self Care | Payer: Managed Care, Other (non HMO) | Attending: Emergency Medicine | Admitting: Emergency Medicine

## 2020-04-27 ENCOUNTER — Other Ambulatory Visit: Payer: Self-pay

## 2020-04-27 DIAGNOSIS — I10 Essential (primary) hypertension: Secondary | ICD-10-CM | POA: Insufficient documentation

## 2020-04-27 DIAGNOSIS — Z79899 Other long term (current) drug therapy: Secondary | ICD-10-CM | POA: Insufficient documentation

## 2020-04-27 DIAGNOSIS — G43D0 Abdominal migraine, not intractable: Secondary | ICD-10-CM | POA: Insufficient documentation

## 2020-04-27 DIAGNOSIS — R109 Unspecified abdominal pain: Secondary | ICD-10-CM | POA: Diagnosis present

## 2020-04-27 LAB — COMPREHENSIVE METABOLIC PANEL
ALT: 78 U/L — ABNORMAL HIGH (ref 0–44)
AST: 34 U/L (ref 15–41)
Albumin: 4.5 g/dL (ref 3.5–5.0)
Alkaline Phosphatase: 59 U/L (ref 38–126)
Anion gap: 12 (ref 5–15)
BUN: 17 mg/dL (ref 6–20)
CO2: 23 mmol/L (ref 22–32)
Calcium: 9.4 mg/dL (ref 8.9–10.3)
Chloride: 104 mmol/L (ref 98–111)
Creatinine, Ser: 0.79 mg/dL (ref 0.61–1.24)
GFR, Estimated: >60 mL/min (ref >60)
Glucose, Bld: 156 mg/dL — ABNORMAL HIGH (ref 70–99)
Potassium: 3.3 mmol/L — ABNORMAL LOW (ref 3.5–5.1)
Sodium: 139 mmol/L (ref 135–145)
Total Bilirubin: 1.6 mg/dL — ABNORMAL HIGH (ref 0.3–1.2)
Total Protein: 8 g/dL (ref 6.5–8.1)

## 2020-04-27 LAB — CBC WITH DIFFERENTIAL/PLATELET
Abs Immature Granulocytes: 0.06 10*3/uL (ref 0.00–0.07)
Basophils Absolute: 0 10*3/uL (ref 0.0–0.1)
Basophils Relative: 1 %
Eosinophils Absolute: 0 10*3/uL (ref 0.0–0.5)
Eosinophils Relative: 0 %
HCT: 53.2 % — ABNORMAL HIGH (ref 39.0–52.0)
Hemoglobin: 18.4 g/dL — ABNORMAL HIGH (ref 13.0–17.0)
Immature Granulocytes: 1 %
Lymphocytes Relative: 32 %
Lymphs Abs: 2.8 10*3/uL (ref 0.7–4.0)
MCH: 29.8 pg (ref 26.0–34.0)
MCHC: 34.6 g/dL (ref 30.0–36.0)
MCV: 86.2 fL (ref 80.0–100.0)
Monocytes Absolute: 1.1 10*3/uL — ABNORMAL HIGH (ref 0.1–1.0)
Monocytes Relative: 13 %
Neutro Abs: 4.7 10*3/uL (ref 1.7–7.7)
Neutrophils Relative %: 53 %
Platelets: 398 10*3/uL (ref 150–400)
RBC: 6.17 MIL/uL — ABNORMAL HIGH (ref 4.22–5.81)
RDW: 13.2 % (ref 11.5–15.5)
WBC: 8.7 10*3/uL (ref 4.0–10.5)
nRBC: 0 % (ref 0.0–0.2)

## 2020-04-27 LAB — LIPASE, BLOOD: Lipase: 18 U/L (ref 11–51)

## 2020-04-27 MED ORDER — HYDROMORPHONE HCL 1 MG/ML IJ SOLN
1.0000 mg | Freq: Once | INTRAMUSCULAR | Status: AC
  Administered 2020-04-27: 1 mg via INTRAVENOUS
  Filled 2020-04-27: qty 1

## 2020-04-27 MED ORDER — ONDANSETRON HCL 4 MG/2ML IJ SOLN
4.0000 mg | Freq: Once | INTRAMUSCULAR | Status: AC
  Administered 2020-04-27: 4 mg via INTRAVENOUS
  Filled 2020-04-27: qty 2

## 2020-04-27 MED ORDER — MAGNESIUM SULFATE 2 GM/50ML IV SOLN
2.0000 g | Freq: Once | INTRAVENOUS | Status: AC
  Administered 2020-04-27: 2 g via INTRAVENOUS
  Filled 2020-04-27: qty 50

## 2020-04-27 MED ORDER — PANTOPRAZOLE SODIUM 40 MG IV SOLR
40.0000 mg | Freq: Once | INTRAVENOUS | Status: AC
  Administered 2020-04-27: 40 mg via INTRAVENOUS
  Filled 2020-04-27: qty 40

## 2020-04-27 MED ORDER — SODIUM CHLORIDE 0.9 % IV BOLUS
1000.0000 mL | Freq: Once | INTRAVENOUS | Status: AC
  Administered 2020-04-27: 1000 mL via INTRAVENOUS

## 2020-04-27 NOTE — ED Provider Notes (Signed)
[place] Provider Note   CSN: [id number] Arrival date & time: 04/27/20  1048     History Chief Complaint  Patient presents with  . Abdominal Pain    Bryan Gilbert is a 54 y.o. male with has no history significant for abdominal migraine, cyclical vomiting, narcotic dependence, chronic neck pain here presents for evaluation of abdominal pain.  Patient states he was seen by [facility] neurology who he sees chronically for his abdominal migraines.  Was last seen 2 days ago.  Started on ketamine, up to 8 times daily.  Patient states he started this yesterday.  He is unsure how many doses he took yesterday.  Patient states he is also used this today.  Patient states she does not currently have any abdominal pain however "I know it is coming."  He has been taking his chronic opiates for his chronic pain.  He denies any nausea or vomiting.  Has not attempted p.o. intake today.  Patient states "I know my abdominal pain is coming."  Rates his current pain a 0/10.  He denies fever, chills, nausea, vomiting, chest pain, shortness of breath, diarrhea, dysuria, constipation.  He denies any melena or blood per rectum in his stools.  His last bowel movement was 2 days ago.  Was recently discharged from hospital 2 days ago for chronic abdominal pain and cyclical vomiting.  Patient takes #6, 10 mg Norco's daily for chronic pain.  Patient states he has felt "off"  since he started taking the ketamine. Denies any additional substance use.  No headache, vision changes slurred speech, weakness, paresthesias.  No additional aggravating or relieving factors.  History obtained from patient and past medical records. No interpretor was used.  Patient states he has chronic pain. He pulls up a paper on his phone requesting Dilaudid 1-2 mg IV every 10 minutes until his pain is a 0/10.   HPI     Past Medical History:  Diagnosis Date  . Abdominal migraine   . Cervical neuralgia   . Cyclical vomiting     . Elevated hemoglobin ([place])   . Hypertension   . Sleep apnea    states uses O2 and CPAP at night  . Slipped intervertebral disc     Patient Active Problem List   Diagnosis Date Noted  . Abdominal migraine, intractable 04/21/2020  . Hypokalemia 04/21/2020  . Narcotic dependency, continuous ([place]) 09/21/2015  . Abdominal pain, left upper quadrant 09/19/2015  . Abdominal pain 09/19/2015  . Cervical neuralgia 09/19/2015  . Left upper quadrant pain   . Insomnia 03/02/2014  . Chronic radicular cervical pain 11/01/2013  . Elevated transaminase level 07/10/2013  . Cyclical vomiting syndrome 06/09/2013  . Male hypogonadism 03/15/2013  . Abnormal TSH 02/12/2012  . Hyperglycemia 02/11/2012  . Obstructive sleep apnea 02/11/2012  . Polycythemia, secondary 02/11/2012  . Cyclic vomiting syndrome 07/10/2011  . HTN (hypertension) 07/10/2011  . Metabolic acidosis 29/79/8921    Past Surgical History:  Procedure Laterality Date  . ESOPHAGOGASTRODUODENOSCOPY         Family History  Problem Relation Age of Onset  . Heart failure Father   . Hypertension Father     Social History   Tobacco Use  . Smoking status: Never Smoker  . Smokeless tobacco: Never Used  Substance Use Topics  . Alcohol use: No  . Drug use: No    Home Medications Prior to Admission medications   Medication Sig Start Date End Date Taking? Authorizing Provider  butorphanol (STADOL)  10 MG/ML nasal spray Place 1 spray into the nose every 4 (four) hours as needed for headache or migraine.  07/28/17   [provider]  cyclobenzaprine (FLEXERIL) 10 MG tablet Take 10 mg by mouth 3 (three) times daily as needed for muscle spasms.     [provider]  HYDROcodone-acetaminophen (NORCO) 10-325 MG tablet Take 1 tablet by mouth every 4 (four) hours as needed for moderate pain.  09/17/15   [provider]  lisinopril-hydrochlorothiazide (ZESTORETIC) 10-12.5 MG tablet Take 1 tablet by mouth daily.  01/24/20   Kathyrn Drown, MD  loperamide (IMODIUM) 2 MG capsule Take 2 mg by mouth as needed for diarrhea or loose stools.    [provider]  meloxicam (MOBIC) 15 MG tablet Take 1 tablet (15 mg total) by mouth daily. 01/24/20   Kathyrn Drown, MD  SALINE NASAL SPRAY NA Place 1 spray into the nose daily as needed (dryness). Reported on 09/17/2015    [provider]  sildenafil (VIAGRA) 100 MG tablet TAKE 1/2 TO 1 TABLET ONE HOUR BEFORE NEEDED Patient taking differently: Take 50-100 mg by mouth as needed for erectile dysfunction (one hour prior to need).  02/27/20   Kathyrn Drown, MD  simethicone (MYLICON) 80 MG chewable tablet Chew 80 mg by mouth every 6 (six) hours as needed for flatulence.    [provider]  Testosterone 20.25 MG/ACT (1.62%) GEL APPLY 2 PUMPS ONTO THE SKIN IN THE MORNING Patient taking differently: Apply 2 Pump topically in the morning.  02/21/20   McKenzie, Candee Furbish, MD    Allergies    Cefprozil, Lipitor [atorvastatin], and Xtandi [enzalutamide]  Review of Systems   Review of Systems  Constitutional: Negative.   HENT: Negative.   Respiratory: Negative.   Cardiovascular: Negative.   Gastrointestinal: Positive for abdominal pain (Chronic, none currently) and vomiting (Chronic, none currently). Negative for abdominal distention, anal bleeding, blood in stool, constipation, diarrhea, nausea and rectal pain.  Genitourinary: Negative.   Musculoskeletal: Negative.   Skin: Negative.   Neurological: Negative for dizziness, tremors, seizures, syncope, facial asymmetry, speech difficulty, weakness, light-headedness, numbness and headaches.       "feels off since starting Ketamine."  Psychiatric/Behavioral: Negative.   All other systems reviewed and are negative.   Physical Exam Updated Vital Signs BP 114/83   Pulse 95   Temp 99.7 F (37.6 C) (Oral)   Resp 15   Ht 5\' 7"  (1.702 m)   Wt 99.8 kg   SpO2 97%   BMI 34.46 kg/m   Physical  Exam Vitals and nursing note reviewed.  Constitutional:      General: He is not in acute distress.    Appearance: He is well-developed. He is not ill-appearing, toxic-appearing or diaphoretic.  HENT:     Head: Normocephalic and atraumatic.     Mouth/Throat:     Pharynx: No pharyngeal swelling or oropharyngeal exudate.  Eyes:     Pupils: Pupils are equal, round, and reactive to light.  Cardiovascular:     Rate and Rhythm: Normal rate and regular rhythm.     Heart sounds: Normal heart sounds.  Pulmonary:     Effort: Pulmonary effort is normal. No respiratory distress.  Abdominal:     General: Bowel sounds are normal. There is no distension.     Palpations: Abdomen is soft.     Tenderness: There is no abdominal tenderness. There is no right CVA tenderness, left CVA tenderness, guarding or rebound. Negative signs include  Murphy's sign and McBurney's sign.     Hernia: No hernia is present.     Comments: Soft, nontender.  No overlying skin changes to abdominal.  Musculoskeletal:        General: Normal range of motion.     Cervical back: Normal range of motion and neck supple.  Skin:    General: Skin is warm and dry.     Capillary Refill: Capillary refill takes less than 2 seconds.  Neurological:     General: No focal deficit present.     Mental Status: He is alert and oriented to person, place, and time.     Comments: Mental Status:  Alert, oriented, thought content appropriate. Speech fluent without evidence of aphasia. Able to follow 2 step commands without difficulty.  Cranial Nerves:  II:  Peripheral visual fields grossly normal, pupils equal, round, reactive to light III,IV, VI: ptosis not present, extra-ocular motions intact bilaterally  V,VII: smile symmetric, facial light touch sensation equal VIII: hearing grossly normal bilaterally  IX,X: midline uvula rise  XI: bilateral shoulder shrug equal and strong XII: midline tongue extension  Motor:  5/5 in upper and lower  extremities bilaterally including strong and equal grip strength and dorsiflexion/plantar flexion Sensory: Pinprick and light touch normal in all extremities.  Deep Tendon Reflexes: 2+ and symmetric  Cerebellar: normal finger-to-nose with bilateral upper extremities Gait: normal gait and balance CV: distal pulses palpable throughout      ED Results / Procedures / Treatments   Labs (all labs ordered are listed, but only abnormal results are displayed) Labs Reviewed  CBC WITH DIFFERENTIAL/PLATELET - Abnormal; Notable for the following components:      Result Value   RBC 6.17 (*)    Hemoglobin 18.4 (*)    HCT 53.2 (*)    Monocytes Absolute 1.1 (*)    All other components within normal limits  COMPREHENSIVE METABOLIC PANEL - Abnormal; Notable for the following components:   Potassium 3.3 (*)    Glucose, Bld 156 (*)    ALT 78 (*)    Total Bilirubin 1.6 (*)    All other components within normal limits  LIPASE, BLOOD    EKG None  Radiology No results found.  Procedures Procedures (including critical care time)  Medications Ordered in ED Medications  sodium chloride 0.9 % bolus 1,000 mL (0 mLs Intravenous Stopped 04/27/20 1532)  HYDROmorphone (DILAUDID) injection 1 mg (1 mg Intravenous Given 04/27/20 1551)  ondansetron (ZOFRAN) injection 4 mg (4 mg Intravenous Given 04/27/20 1550)  pantoprazole (PROTONIX) injection 40 mg (40 mg Intravenous Given 04/27/20 1551)  magnesium sulfate IVPB 2 g 50 mL (0 g Intravenous Stopped 04/27/20 1706)   ED Course  I have reviewed the triage vital signs and the nursing notes.  Pertinent labs & imaging results that were available during my care of the patient were reviewed by me and considered in my medical decision making (see chart for details).  54 year old male patient otherwise well presents for evaluation of abdominal pain.  Patient with history of chronic abdominal migraines.  He is followed by [facility] neurology.  Recently discharged 2 days  ago after hospital stay for intractable pain and cyclical vomiting.  Patient denied any vomiting at home.  Patient actually states he does not currently have any abdominal pain however states "I know it is coming."  Last bowel movement 2 days ago without melena or bright blood per rectum.  He has not had any emesis.  Was also recently started 2 days  ago and ketamine for his chronic pain.  Patient states he has felt "a little off" since he started this.  He took this medication yesterday as well as today.  Patient with nonfocal neuro exam without deficits.  His heart and lungs are clear.  His abdomen is soft, nontender. He is passing flatulence. Plan on checking labs, IV fluids.  I have checked his prior hospital admission as well as his neurology note.  Prior hospitalist who admitted patient spoke with his [place] neurologist.  Recommended at that time to decrease the Dilaudid as this can make abdominal migraines worse, recommended adding magnesium and as needed Haldol for symptoms, per Previous providers note.  Nursing states that patient is refusing p.o. challenge. I went to discuss with patient.  Patient states he feels like he now has abdominal pain.  He has not had nausea or vomiting here in the emergency department.  He is requesting his "normal dose" of pain medication which she states is a 2 mg Dilaudid IV every 10 minutes until his pain is a 0/10 as well as 40 mg of IV Protonix and 2 mg of IV magnesium.  Patient states he cannot be discharged until his pain is a 0/10.  I discussed with patient that given his pain is chronic in nature that we likely will not be able to get his pain down to 0 out of 10 however will give him single dose of medication and reassess.  As of this time patient is refusing any p.o. challenge even though he has been in the emergency department for 5 hours without any nausea or vomiting. Declines Haldol which was recommended by previous admitting team for symptoms.  Patient  reassessed. Requesting "Dilaudid drip." States "drip" is "only thing that works and admission to hospital." He appear comfortable in room. Has tolerated PO challenge after getting single dose of pain medication. Pain down to a 4/10. Do not feel patient needs "dilaudid drip" or admission at this time.  Patient at this  begins to curse at this provider. Stating he needs to talk toe "Dr. Roderic Palau" hospitalist who admitted him previously as "he only understands my pain." Stating that he needs more "Dilaudid."  Again reiterates that he needs admission. Discussed with patient that he was admitted 2 days ago for cyclical vomiting and he has been able to tolerate p.o. intake here in emergency department has not had any episodes of emesis or retching over the course of his 6-hour stay.  I discussed with patient to continue with the medications as prescribed to him by neurology for his abdominal migraine and patient states "I will just be back for my Dilaudid."  At this time I have low suspicion for acute emergent process. Patient does not meet the SIRS or Sepsis criteria. On repeat exam patient does not have a surgical abdomin and there are no peritoneal signs.  No indication of appendicitis, bowel obstruction, bowel perforation, cholecystitis, diverticulitis.  Patient has nonfocal neuro exam without deficits. Tolerating PO intake here in ED.  I discussed return precautions.  Will have patient follow-up outpatient with specialist.  He will return for any worsening symptoms.  The patient has been appropriately medically screened and/or stabilized in the ED. I have low suspicion for any other emergent medical condition which would require further screening, evaluation or treatment in the ED or require inpatient management.  Patient is hemodynamically stable and in no acute distress.  Patient able to ambulate in department prior to ED.  Evaluation does not  show acute pathology that would require ongoing or additional  emergent interventions while in the emergency department or further inpatient treatment.  I have discussed the diagnosis with the patient and answered all questions.  Pain is been managed while in the emergency department and patient has no further complaints prior to discharge.  Patient is comfortable with plan discussed in room and is stable for discharge at this time.  I have discussed strict return precautions for returning to the emergency department.  Patient was encouraged to follow-up with PCP/specialist refer to at discharge.  Patient seen and evaluated by attending Dr. Ashok Cordia who agrees with above treatment, plan and disposition.    MDM Rules/Calculators/A&P                           Final Clinical Impression(s) / ED Diagnoses Final diagnoses:  Abdominal migraine, not intractable    Rx / DC Orders ED Discharge Orders    None       Tylee Yum A, PA-C 04/27/20 1733    Lajean Saver, MD 04/27/20 2141

## 2020-04-27 NOTE — ED Triage Notes (Signed)
Hx abd migraines   Followed at [facility]   Seen there last week   Recent admission here for same   Here for abd pain   Does not know when last BM was

## 2020-04-27 NOTE — ED Notes (Signed)
Patient requesting to speak to EDPA about being admitted. Dr Ashok Cordia to room to talk to patient at this time.

## 2020-04-27 NOTE — Discharge Instructions (Signed)
Follow up with Neurology.

## 2020-04-27 NOTE — ED Notes (Signed)
Patient refusing fluid challenge at this time, EDPA made aware.

## 2020-05-01 NOTE — Telephone Encounter (Signed)
Noted thank you

## 2020-05-01 NOTE — Telephone Encounter (Signed)
Discussed with patient. Patient states he has an appt to talk with his current pain management today and he is going to try to get some answers and come up with a plan. Patient states he will call back after his appt if he is needing a referral or anything from PCP.

## 2020-06-26 ENCOUNTER — Ambulatory Visit: Payer: Managed Care, Other (non HMO) | Admitting: Urology

## 2020-07-20 ENCOUNTER — Other Ambulatory Visit: Payer: Self-pay | Admitting: Family Medicine

## 2020-07-26 ENCOUNTER — Ambulatory Visit: Payer: Managed Care, Other (non HMO) | Admitting: Family Medicine

## 2020-07-29 ENCOUNTER — Ambulatory Visit (INDEPENDENT_AMBULATORY_CARE_PROVIDER_SITE_OTHER): Payer: Managed Care, Other (non HMO) | Admitting: Urology

## 2020-07-29 ENCOUNTER — Other Ambulatory Visit: Payer: Self-pay

## 2020-07-29 ENCOUNTER — Encounter: Payer: Self-pay | Admitting: Urology

## 2020-07-29 VITALS — BP 137/89 | HR 81 | Temp 98.7°F | Ht 67.0 in | Wt 226.0 lb

## 2020-07-29 DIAGNOSIS — E291 Testicular hypofunction: Secondary | ICD-10-CM

## 2020-07-29 MED ORDER — TESTOSTERONE 20.25 MG/ACT (1.62%) TD GEL
2.0000 | Freq: Every morning | TRANSDERMAL | 3 refills | Status: DC
Start: 2020-07-29 — End: 2020-12-31

## 2020-07-29 NOTE — Progress Notes (Signed)
07/29/2020 3:46 PM   Bryan Gilbert 14-Oct-1965 [id number]  Referring provider: Kathyrn Drown, MD [address] [place],  [place] [address]  followup hypogonadism  HPI: Bryan Gilbert is a 55yo here for followup for hypogonadism. No recent labs. Fair energy. Good libido. He had erectile dysfunction and takes viagra prn. He is on androgel 1.62% 2 pumps daily. No other complaints today   PMH: Past Medical History:  Diagnosis Date  . Abdominal migraine   . Cervical neuralgia   . Cyclical vomiting   . Elevated hemoglobin ([place])   . Hypertension   . Sleep apnea    states uses O2 and CPAP at night  . Slipped intervertebral disc     Surgical History: Past Surgical History:  Procedure Laterality Date  . ESOPHAGOGASTRODUODENOSCOPY      Home Medications:  Allergies as of 07/29/2020      Reactions   Cefprozil Diarrhea, Nausea Only   Intestinal upset and diarrhea   Lipitor [atorvastatin] Other (See Comments)   Myalgia   Xtandi [enzalutamide]    GI upset      Medication List       Accurate as of July 29, 2020  3:46 PM. If you have any questions, ask your nurse or doctor.        butorphanol 10 MG/ML nasal spray Commonly known as: STADOL Place 1 spray into the nose every 4 (four) hours as needed for headache or migraine.   cyclobenzaprine 10 MG tablet Commonly known as: FLEXERIL Take 10 mg by mouth 3 (three) times daily as needed for muscle spasms.   escitalopram 10 MG tablet Commonly known as: LEXAPRO Take by mouth.   HYDROcodone-acetaminophen 10-325 MG tablet Commonly known as: NORCO Take 1 tablet by mouth every 4 (four) hours as needed for moderate pain.   levorphanol 2 MG tablet Commonly known as: LEVODROMORAN   lisinopril-hydrochlorothiazide 10-12.5 MG tablet Commonly known as: ZESTORETIC Take 1 tablet by mouth daily.   loperamide 2 MG capsule Commonly known as: IMODIUM Take 2 mg by mouth as needed for diarrhea or loose stools.   meloxicam 15 MG  tablet Commonly known as: MOBIC Take 1 tablet (15 mg total) by mouth daily.   SALINE NASAL SPRAY NA Place 1 spray into the nose daily as needed (dryness). Reported on 09/17/2015   sildenafil 100 MG tablet Commonly known as: VIAGRA TAKE 1/2 TO 1 TABLET ONE HOUR BEFORE NEEDED   simethicone 80 MG chewable tablet Commonly known as: MYLICON Chew 80 mg by mouth every 6 (six) hours as needed for flatulence.   Testosterone 20.25 MG/ACT (1.62%) Gel APPLY 2 PUMPS ONTO THE SKIN IN THE MORNING What changed:   how much to take  how to take this  when to take this  additional instructions       Allergies:  Allergies  Allergen Reactions  . Cefprozil Diarrhea and Nausea Only    Intestinal upset and diarrhea  . Lipitor [Atorvastatin] Other (See Comments)    Myalgia  . Xtandi [Enzalutamide]     GI upset    Family History: Family History  Problem Relation Age of Onset  . Heart failure Father   . Hypertension Father     Social History:  reports that he has never smoked. He has never used smokeless tobacco. He reports that he does not drink alcohol and does not use drugs.  ROS: All other review of systems were reviewed and are negative except what is noted above in HPI  Physical Exam: BP 137/89   Pulse 81   Temp 98.7 F (37.1 C)   Ht 5\' 7"  (1.702 m)   Wt 226 lb (102.5 kg)   BMI 35.40 kg/m   Constitutional:  Alert and oriented, No acute distress. HEENT: [place] AT, moist mucus membranes.  Trachea midline, no masses. Cardiovascular: No clubbing, cyanosis, or edema. Respiratory: Normal respiratory effort, no increased work of breathing. GI: Abdomen is soft, nontender, nondistended, no abdominal masses GU: No CVA tenderness.  Lymph: No cervical or inguinal lymphadenopathy. Skin: No rashes, bruises or suspicious lesions. Neurologic: Grossly intact, no focal deficits, moving all 4 extremities. Psychiatric: Normal mood and affect.  Laboratory Data: Lab Results  Component  Value Date   WBC 8.7 04/27/2020   HGB 18.4 (H) 04/27/2020   HCT 53.2 (H) 04/27/2020   MCV 86.2 04/27/2020   PLT 398 04/27/2020    Lab Results  Component Value Date   CREATININE 0.79 04/27/2020    No results found for: PSA  Lab Results  Component Value Date   TESTOSTERONE 61 (L) 01/31/2019    Lab Results  Component Value Date   HGBA1C 5.7 (H) 01/31/2019    Urinalysis    Component Value Date/Time   COLORURINE YELLOW 04/16/2020 1319   APPEARANCEUR HAZY (A) 04/16/2020 1319   LABSPEC 1.021 04/16/2020 1319   PHURINE 7.0 04/16/2020 1319   GLUCOSEU NEGATIVE 04/16/2020 1319   HGBUR NEGATIVE 04/16/2020 1319   BILIRUBINUR NEGATIVE 04/16/2020 1319   KETONESUR 5 (A) 04/16/2020 1319   PROTEINUR 30 (A) 04/16/2020 1319   UROBILINOGEN 0.2 05/19/2013 1732   NITRITE NEGATIVE 04/16/2020 1319   LEUKOCYTESUR NEGATIVE 04/16/2020 1319    Lab Results  Component Value Date   BACTERIA RARE (A) 04/16/2020    Pertinent Imaging:  No results found for this or any previous visit.  No results found for this or any previous visit.  No results found for this or any previous visit.  No results found for this or any previous visit.  No results found for this or any previous visit.  No results found for this or any previous visit.  No results found for this or any previous visit.  No results found for this or any previous visit.   Assessment & Plan:    1. Male hypogonadism -Patient to have labs drawn by Dr. Wolfgang Phoenix,  -Continue androgel 1.62% 2 pumps daily -RTC 6 months with labs   No follow-ups on file.  Nicolette Bang, MD  [facility] [place]

## 2020-07-29 NOTE — Progress Notes (Signed)

## 2020-07-29 NOTE — Patient Instructions (Signed)
Testosterone skin gel What is this medicine? TESTOSTERONE (tes TOS ter one) is the main male hormone. It supports normal male traits such as muscle growth, facial hair, and deep voice. This gel is used in males to treat low testosterone levels. This medicine may be used for other purposes; ask your health care provider or pharmacist if you have questions. COMMON BRAND NAME(S): AndroGel, FORTESTA, Testim, Vogelxo What should I tell my health care provider before I take this medicine? They need to know if you have any of these conditions:  breast cancer  diabetes  heart disease  heart failure  if a male partner is pregnant or trying to get pregnant  kidney disease  liver disease  lung or breathing disease (asthma, COPD)  polycythemia  prostate cancer or disease  sleep apnea  an unusual or allergic reaction to testosterone, other medicines, foods, dyes, or preservatives  pregnant or trying to get pregnant  breast-feeding How should I use this medicine? This drug is for external use only. Do not take by mouth. Wash your hands before and after use. Use it as directed on the prescription label, at the same time every day. Do not use it more often than directed. Make sure that you are using your product correctly. Allow the skin to air-dry, then cover with clothing to prevent others from coming in contact with the drug on your skin. This drug comes with INSTRUCTIONS FOR USE. Ask your pharmacist for directions on how to use this drug. Read the information carefully. Talk to your pharmacist or health care provider if you have questions. A special MedGuide will be given to you by the pharmacist with each prescription and refill. Be sure to read this information carefully each time. Talk to your pediatrician regarding the use of this medicine in children. Special care may be needed. Overdosage: If you think you have taken too much of this medicine contact a poison control center or  emergency room at once. NOTE: This medicine is only for you. Do not share this medicine with others. What if I miss a dose? If you miss a dose, use it as soon as you can. If it is almost time for your next dose, use only that dose. Do not use double or extra doses. What may interact with this medicine?  medicines for diabetes  medicines that treat or prevent blood clots like warfarin  steroid medicines like prednisone or cortisone This list may not describe all possible interactions. Give your health care provider a list of all the medicines, herbs, non-prescription drugs, or dietary supplements you use. Also tell them if you smoke, drink alcohol, or use illegal drugs. Some items may interact with your medicine. What should I watch for while using this medicine? Visit your health care provider for regular checks on your progress. Tell your health care provider if your symptoms do not start to get better or if they get worse.You may need blood work while you are taking this drug. This drug is for use in men who have low levels of testosterone related to certain medical conditions. Heart attacks and strokes have been reported with the use of this drug. Get emergency help if you develop signs or symptoms of a heart attack or stroke . Talk to your health care provider about the risks and benefits of this drug. This drug can transfer from your body to others. If a person or pet comes in contact with the drug, they may have a serious risk of  side effects. If you cannot avoid skin-to-skin contact, cover the drug site with clothing. If accidental contact happens, wash the skin of the person or pet right away with soap and water. Also, a male partner who is pregnant or trying to get pregnant should avoid contact with the gel or treated skin. Do not become pregnant while taking this drug. Women should inform their health care provider if they wish to become pregnant or think they might be pregnant. There is  potential for serious harm to an unborn child. Tell your health care provider for more information. This drug may affect blood sugar. Ask your health care provider if changes in diet or drugs are needed if you have diabetes. This drug is banned from use in athletes by most athletic organizations. What side effects may I notice from receiving this medicine? Side effects that you should report to your doctor or health care professional as soon as possible:  allergic reactions like skin rash, itching or hives, swelling of the face, lips, or tongue  breast enlargement  breathing problems  changes in emotions or moods  high blood pressure  signs and symptoms of a blood clot such as chest pain; shortness of breath; pain, swelling, or warmth in the leg  signs and symptoms of liver injury like dark yellow or brown urine; general ill feeling or flu-like symptoms; light-colored stools; loss of appetite; nausea; right upper belly pain; unusually weak or tired; yellowing of the eyes or skin  swelling of the ankles, feet, hands  too frequent or persistent erections  trouble passing urine or change in the amount of urine Side effects that usually do not require medical attention (report these to your doctor or health care professional if they continue or are bothersome):  acne  headache  skin irritation at site where applied This list may not describe all possible side effects. Call your doctor for medical advice about side effects. You may report side effects to FDA at 1-800-FDA-1088. Where should I keep my medicine? Keep out of the reach of children. This medicine can be abused. Keep your medicine in a safe place to protect it from theft. Do not share this medicine with anyone. Selling or giving away this medicine is dangerous and against the law. Store at room temperature between 20 to 25 degrees C (68 to 77 degrees F). Keep closed until use. Protect from heat and light. This medicine is  flammable. Avoid exposure to heat, fire, flame, and smoking. Throw away any unused medicine after the expiration date. NOTE: This sheet is a summary. It may not cover all possible information. If you have questions about this medicine, talk to your doctor, pharmacist, or health care provider.  2021 Elsevier/Gold Standard (2019-06-09 11:23:50)

## 2020-08-01 ENCOUNTER — Ambulatory Visit (INDEPENDENT_AMBULATORY_CARE_PROVIDER_SITE_OTHER): Payer: Managed Care, Other (non HMO) | Admitting: Family Medicine

## 2020-08-01 ENCOUNTER — Other Ambulatory Visit: Payer: Self-pay

## 2020-08-01 VITALS — BP 128/80 | Temp 97.7°F | Ht 67.0 in | Wt 226.8 lb

## 2020-08-01 DIAGNOSIS — I10 Essential (primary) hypertension: Secondary | ICD-10-CM

## 2020-08-01 DIAGNOSIS — Z125 Encounter for screening for malignant neoplasm of prostate: Secondary | ICD-10-CM

## 2020-08-01 DIAGNOSIS — R7303 Prediabetes: Secondary | ICD-10-CM | POA: Diagnosis not present

## 2020-08-01 DIAGNOSIS — Z13220 Encounter for screening for lipoid disorders: Secondary | ICD-10-CM

## 2020-08-01 DIAGNOSIS — Z1211 Encounter for screening for malignant neoplasm of colon: Secondary | ICD-10-CM

## 2020-08-01 DIAGNOSIS — G4733 Obstructive sleep apnea (adult) (pediatric): Secondary | ICD-10-CM | POA: Diagnosis not present

## 2020-08-01 DIAGNOSIS — R7301 Impaired fasting glucose: Secondary | ICD-10-CM

## 2020-08-01 MED ORDER — LISINOPRIL-HYDROCHLOROTHIAZIDE 10-12.5 MG PO TABS
1.0000 | ORAL_TABLET | Freq: Every day | ORAL | 1 refills | Status: DC
Start: 2020-08-01 — End: 2021-03-17

## 2020-08-01 MED ORDER — MELOXICAM 15 MG PO TABS
15.0000 mg | ORAL_TABLET | Freq: Every day | ORAL | 1 refills | Status: DC
Start: 2020-08-01 — End: 2021-03-18

## 2020-08-01 NOTE — Progress Notes (Signed)
Subjective:    Patient ID: Bryan Gilbert, male    DOB: [birth year], 55 y.o.   MRN: [id number]  Hypertension This is a chronic problem. The current episode started more than 1 year ago. Pertinent negatives include no chest pain, headaches or shortness of breath. Risk factors for coronary artery disease include male gender. Treatments tried: zestoretic. There are no compliance problems.    Very nice patient Overall trying to do well with dietary measures Going through some stresses regarding job potential changes Denies any chest tightness pressure pain  Recently in the hospital with abdominal migraines had severe time had to utilize IV narcotics as well as nausea medicine he gets these flareups on a fairly regular basis and follows through with specialist regarding these   Review of Systems  Constitutional: Negative for activity change, fatigue and fever.  HENT: Negative for congestion and rhinorrhea.   Respiratory: Negative for cough and shortness of breath.   Cardiovascular: Negative for chest pain and leg swelling.  Gastrointestinal: Negative for abdominal pain, diarrhea and nausea.  Genitourinary: Negative for dysuria and hematuria.  Neurological: Negative for weakness and headaches.  Psychiatric/Behavioral: Negative for agitation and behavioral problems.       Objective:   Physical Exam Vitals reviewed.  Constitutional:      General: He is not in acute distress.    Appearance: He is well-nourished.  HENT:     Head: Normocephalic and atraumatic.  Eyes:     General:        Right eye: No discharge.        Left eye: No discharge.  Neck:     Trachea: No tracheal deviation.  Cardiovascular:     Rate and Rhythm: Normal rate and regular rhythm.     Heart sounds: Normal heart sounds. No murmur heard.   Pulmonary:     Effort: Pulmonary effort is normal. No respiratory distress.     Breath sounds: Normal breath sounds.  Musculoskeletal:        General: No edema.  Skin:     General: Skin is warm and dry.  Neurological:     Mental Status: He is alert.     Coordination: Coordination normal.  Psychiatric:        Mood and Affect: Mood and affect normal.        Behavior: Behavior normal.    Patient with severe sleep apnea his machine no longer works and he also states he finds himself feeling very fatigued tired sleeping it is clear that he has sleep apnea and would benefit from a CPAP machine but needs a new sleep study       Assessment & Plan:  1. Primary hypertension HTN- patient seen for follow-up regarding HTN.  Diet, medication compliance, appropriate labs and refills were completed.  Importance of keeping blood pressure under good control to lessen the risk of complications discussed Blood pressure good control continue current measures  2. Obstructive sleep apnea Very important to do a split study with titration patient would benefit from a CPAP machine see documentation above - Split night study; Future  3. Fasting hyperglycemia Check A1c watch starches in the diet stay active - Hemoglobin A1c  4. Prediabetes Please see above  5. Screening for colon cancer Stool test for blood we did discuss Cologuard at this point time he defers on this we also discussed colonoscopy but he is concerned that taking the prep would trigger his abdominal migraine - IFOBT POC (occult bld, rslt in  office); Future  6. Screening, lipid Screening for lipid - Lipid panel  7. Screening PSA (prostate specific antigen) Screening for PSA - PSA  Follow-up 6 months

## 2020-08-04 NOTE — Telephone Encounter (Signed)
error 

## 2020-08-13 ENCOUNTER — Other Ambulatory Visit: Payer: Self-pay | Admitting: Family Medicine

## 2020-08-13 DIAGNOSIS — G4733 Obstructive sleep apnea (adult) (pediatric): Secondary | ICD-10-CM

## 2020-08-14 ENCOUNTER — Telehealth: Payer: Self-pay

## 2020-08-14 DIAGNOSIS — G4733 Obstructive sleep apnea (adult) (pediatric): Secondary | ICD-10-CM

## 2020-08-14 NOTE — Telephone Encounter (Signed)
Megan from [facility] sleep needs Lavella Lemons to call her there was a order sent over for sleep study she saying that they will need a referral in order to do this sleep study   Call back [phone] [facility]

## 2020-08-15 NOTE — Telephone Encounter (Signed)
Spoke with Jinny Blossom at [facility] does not accept direct orders for sleep studies. It has to be a referral because the patient will need to have a consult. Referral placed in Epic.  Left message to return call to advise patient.

## 2020-08-21 NOTE — Telephone Encounter (Signed)
Sent mychart message to patient

## 2020-09-25 ENCOUNTER — Ambulatory Visit (INDEPENDENT_AMBULATORY_CARE_PROVIDER_SITE_OTHER): Payer: Managed Care, Other (non HMO) | Admitting: Neurology

## 2020-09-25 ENCOUNTER — Encounter: Payer: Self-pay | Admitting: Neurology

## 2020-09-25 VITALS — BP 129/92 | HR 111 | Ht 67.0 in | Wt 233.0 lb

## 2020-09-25 DIAGNOSIS — G8929 Other chronic pain: Secondary | ICD-10-CM | POA: Insufficient documentation

## 2020-09-25 DIAGNOSIS — F458 Other somatoform disorders: Secondary | ICD-10-CM

## 2020-09-25 DIAGNOSIS — G4731 Primary central sleep apnea: Secondary | ICD-10-CM

## 2020-09-25 DIAGNOSIS — F5102 Adjustment insomnia: Secondary | ICD-10-CM | POA: Diagnosis not present

## 2020-09-25 DIAGNOSIS — G4719 Other hypersomnia: Secondary | ICD-10-CM | POA: Insufficient documentation

## 2020-09-25 DIAGNOSIS — G4701 Insomnia due to medical condition: Secondary | ICD-10-CM | POA: Insufficient documentation

## 2020-09-25 DIAGNOSIS — Z9989 Dependence on other enabling machines and devices: Secondary | ICD-10-CM

## 2020-09-25 NOTE — Progress Notes (Signed)
SLEEP MEDICINE CLINIC    Provider:  Larey Seat, MD  Primary Care Physician:  Kathyrn Drown, [place] [address]     Referring Provider: Kathyrn Drown, [address] [place],  [place] [address]          Chief Complaint according to patient   Patient presents with:    . New Patient (Initial Visit)     Pt presents today to establish care and assess OSA concerns. He uses a BiPAP and its old and we have no way of reading the data. Patient last SS >12 years ago.  He continues to wear the machine each night. He may sleep 3-6 hours/ night. He has elevated hemoglobin-       HISTORY OF PRESENT ILLNESS:   09-25-2020:  Bryan Gilbert is a 55 - year- old  Caucasian male patient seen upon a referral on 09/25/2020  for a  Sleep Consultation evaluation.   Chief concern according to patient :   [name] has been a compliant CPAP user for well over a decade.  He received his original machine from the durable medical equipment out [place] near [place].  He has not had a reevaluation of sleep apnea nor a new machine since initially beginning this treatment.  He has used a full facemask by [employer] by description this seems to be the F 20.  He continues to be a compliant user but data are not available from the machine of this age.  He has actually used a BiPAP function.  He now reports that he sleeps only 3 to 6 hours on average at night and that he has become more sleepy in daytime again endorsing the Epworth sleepiness score at 13 points.  Other factors of health and lifestyle may also contribute to the excessive sleepiness, the patient presented here with some tachycardia regular heart rate of 111 bpm, according to his primary care physician there has been some weight gain.  He also has some facial hair which may affect the sealand efficacy of a full facemask.   Bryan Gilbert  has a past medical history of Abdominal migraine, Cervical neuralgia,  Cyclical vomiting, " abdominal migraine- last bout in October 2021- lost a lot of weight.  Elevated hemoglobin ([place]), Hypertension, OSA on BiPAP and Slipped intervertebral disc. Family medical /sleep history: No other family member on CPAP with OSA, insomnia, sleep walkers. father had HTN , mother hypercholesterolemia.    Social history:  Patient is working as Fish farm manager, and lives in a household with  Prosser child, he older 2 are living with mom, college age-  Family status is divorced - 2 cats.  The patient currently works  In daytime.  Tobacco use- none.  ETOH use ; rare , Caffeine intake in form of  Soda( 2 a week).Regular exercise ; none . Marland Kitchen   Hobbies : yard work.    Sleep habits are as follows: The patient's dinner time is between 7-9 PM.  The patient goes to bed after midnight and continues to struggle to go to sleep for several hours, sleep onset at 3 Am is not uncommon. Wakes not for bathroom breaks,.   The preferred sleep position is lateral or prone. , with the support of one  pillows.  Dreams are reportedly infrequent-  7 AM is the usual rise time. The patient wakes up spontaneously. s he takes his meds and may lay down again.  He reports not feeling refreshed or restored in AM, with symptoms such as dry mouth,  morning headaches- sometimes and residual fatigue.  Naps are taken daily - sometimes before noon-  lasting from 30-90 minutes and are not more refreshing than nocturnal sleep.    Review of Systems: Out of a complete 14 system review, the patient complains of only the following symptoms, and all other reviewed systems are negative.:  Fatigue, sleepiness , snoring, always tired. Some neck spine pain, DDD.  fragmented sleep, Insomnia - delayed sleep onset, prolonged naps,    How likely are you to doze in the following situations: 0 = not likely, 1 = slight chance, 2 = moderate chance, 3 = high chance   Sitting and Reading? Watching Television? Sitting inactive in a  public place (theater or meeting)? As a passenger in a car for an hour without a break? Lying down in the afternoon when circumstances permit? Sitting and talking to someone? Sitting quietly after lunch without alcohol? In a car, while stopped for a few minutes in traffic?   Total = 13/ 24 points   FSS endorsed at 44 / 63 points.   Social History   Socioeconomic History  . Marital status: Married    Spouse name: Not on file  . Number of children: Not on file  . Years of education: Not on file  . Highest education level: Not on file  Occupational History  . Not on file  Tobacco Use  . Smoking status: Never Smoker  . Smokeless tobacco: Never Used  Substance and Sexual Activity  . Alcohol use: No  . Drug use: No  . Sexual activity: Yes    Birth control/protection: None  Other Topics Concern  . Not on file  Social History Narrative  . Not on file   Social Determinants of Health   Financial Resource Strain: Not on file  Food Insecurity: Not on file  Transportation Needs: Not on file  Physical Activity: Not on file  Stress: Not on file  Social Connections: Not on file    Family History  Problem Relation Age of Onset  . Heart failure Father   . Hypertension Father     Past Medical History:  Diagnosis Date  . Abdominal migraine   . Cervical neuralgia   . Cyclical vomiting   . Elevated hemoglobin ([place])   . Hypertension   . Sleep apnea    states uses O2 and CPAP at night  . Slipped intervertebral disc     Past Surgical History:  Procedure Laterality Date  . ESOPHAGOGASTRODUODENOSCOPY       Current Outpatient Medications on File Prior to Visit  Medication Sig Dispense Refill  . butorphanol (STADOL) 10 MG/ML nasal spray Place 1 spray into the nose every 4 (four) hours as needed for headache or migraine.     . cyclobenzaprine (FLEXERIL) 10 MG tablet Take 10 mg by mouth 3 (three) times daily as needed for muscle spasms.     Marland Kitchen HYDROcodone-acetaminophen (NORCO)  10-325 MG tablet Take 1 tablet by mouth every 4 (four) hours as needed for moderate pain.     Marland Kitchen levorphanol (LEVODROMORAN) 2 MG tablet     . lisinopril-hydrochlorothiazide (ZESTORETIC) 10-12.5 MG tablet Take 1 tablet by mouth daily. 90 tablet 1  . loperamide (IMODIUM) 2 MG capsule Take 2 mg by mouth as needed for diarrhea or loose stools.    . meloxicam (MOBIC) 15 MG tablet Take 1 tablet (15 mg total) by mouth daily.  90 tablet 1  . SALINE NASAL SPRAY NA Place 1 spray into the nose daily as needed (dryness). Reported on 09/17/2015    . sildenafil (VIAGRA) 100 MG tablet TAKE 1/2 TO 1 TABLET ONE HOUR BEFORE NEEDED 5 tablet 3  . simethicone (MYLICON) 80 MG chewable tablet Chew 80 mg by mouth every 6 (six) hours as needed for flatulence.    . Testosterone 20.25 MG/ACT (1.62%) GEL Apply 2 Pump topically in the morning. 75 g 3   No current facility-administered medications on file prior to visit.    Allergies  Allergen Reactions  . Cefprozil Diarrhea and Nausea Only    Intestinal upset and diarrhea  . Lipitor [Atorvastatin] Other (See Comments)    Myalgia  . Xtandi [Enzalutamide]     GI upset    Physical exam:  Today's Vitals   09/25/20 1259  BP: (!) 129/92  Pulse: (!) 111  Weight: 233 lb (105.7 kg)  Height: 5\' 7"  (1.702 m)   Body mass index is 36.49 kg/m.   Wt Readings from Last 3 Encounters:  09/25/20 233 lb (105.7 kg)  08/01/20 226 lb 12.8 oz (102.9 kg)  07/29/20 226 lb (102.5 kg)     Ht Readings from Last 3 Encounters:  09/25/20 5\' 7"  (1.702 m)  08/01/20 5\' 7"  (1.702 m)  07/29/20 5\' 7"  (1.702 m)      General: The patient is awake, alert and appears not in acute distress. The patient is well groomed. tachycardic.  Head: Normocephalic, atraumatic. Neck is supple.  Mallampati 3 plus  - neck circumference:17.5"  inches .  Nasal airflow is patent.  Septal deviation to the right - Retrognathia is not  seen. But crowded dentition.  Small mouth.  Dental status: used to wear  braces.  Cardiovascular:  Regular rate and cardiac rhythm by pulse,  without distended neck veins. Respiratory: Lungs are clear to auscultation.  Skin:  Without evidence of ankle edema, or rash. Trunk: The patient's posture is erect.   Neurologic exam : The patient is awake and alert, oriented to place and time.   Memory subjective described as intact.  Attention span & concentration ability appears normal.  Speech is fluent,  without  dysarthria, dysphonia or aphasia.  Mood and affect are appropriate.   Cranial nerves: no loss of smell or taste reported  Pupils are equal and briskly reactive to light. Funduscopic exam deferred.  Extraocular movements in vertical and horizontal planes were intact and without nystagmus. No Diplopia. Visual fields by finger perimetry are intact. Hearing was intact to soft voice and finger rubbing. Facial sensation intact to fine touch. Facial motor strength is symmetric and tongue and uvula move midline.  Neck ROM : rotation, tilt and flexion extension were normal for age and shoulder shrug was symmetrical.    Motor exam:  Symmetric bulk, tone and ROM.   Normal tone without cog wheeling, symmetric grip strength . He has good grip strength.    Sensory:  Fine touch, pinprick and vibration were normal.  C4-5-6 radiculopathy reported.  Proprioception tested in the upper extremities was normal.   Coordination: Rapid alternating movements in the fingers/hands were of normal speed.  The Finger-to-nose maneuver was intact without evidence of ataxia, dysmetria - he has a tremor , action not resting-    Gait and station: Patient could rise unassisted from a seated position, walked without assistive device.  Stance is of normal width/ base and the patient turned with 3 steps.  Toe and heel walk  were deferred.  Deep tendon reflexes: in the upper and lower extremities are symmetric and intact.  Babinski response was deferred.      After spending a total time of  45 minutes face to face and additional time for physical and neurologic examination, review of laboratory studies,  personal review of imaging studies, reports and results of other testing and review of referral information / records as far as provided in visit, I have established the following assessments:  1) EDS, BiPAP, narcotic pain medication, cervical spine disease and  based on the referral documents I see that his primary care physician would like for the patient to undergo a split-night study which would preserve the ability to see a baseline first and then initiate therapy.  I have not been ever successful with [employer] to get this kind of study approved but Christella Scheuermann usually is insisting on as a home sleep test in the night without BiPAP use to see that the patient has still apnea then an auto titration machine CPAP and if this is failed [employer] will allow [place] a BiPAP titration.  I will order a split-night study I just want to make Dr. Wolfgang Phoenix aware that this is not likely to go through.    The patient has a history and have a history of elevated hemoglobin, fasting hyperglycemia, discussion of prediabetes and elevated red blood cell count can both be associated with insufficiently treated apnea.  His tachycardia was explained by his use of decongestant medication just before he came here today so I will not need to work him up.   My Plan is to proceed with:  1) SPLIT night polysomnography-  I like to see o if this is central or obstructive apnea and the patient reported he couldn't use CPAP- at the needed high pressures. He is not aware of the current BiPAP settings.  narcotic sleep medication - he is at high risk for central events.  He may still have hypoxia - hematocrit/ hemoglobin count is high.   THIS IS an adjustable BiPAP - 30-15 cm cm IPAP and 8 cm EPAP . He reports aerophagia-  I on FFM . 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    SPLIT at AHI 20 and at o2 of 75%    2) HST only if [employer] is insisting.    I would like to thank and Kathyrn Drown, [address],  [place] [address] for allowing me to meet with and to take care of this pleasant patient.   In short, Shermar Friedland Caudill is presenting with EDS, BiPAP use and is on opiate pain medication.   I plan to follow up through our NP within 2-4  month.   CC: I will share my notes with PCP>   Electronically signed by: Larey Seat, MD 09/25/2020 1:28 PM  [facility] and [facility] Sleep Board certified by The [employer] of Sleep Medicine and Diplomate of the [employer] of Sleep Medicine. Board certified In Neurology through the [place], Fellow of the [employer] of Neurology. Medical Director of [employer].

## 2020-09-25 NOTE — Patient Instructions (Signed)

## 2020-10-02 ENCOUNTER — Telehealth: Payer: Self-pay

## 2020-10-02 NOTE — Telephone Encounter (Signed)
LVM for pt to call me back to schedule sleep study  

## 2020-10-24 ENCOUNTER — Ambulatory Visit (INDEPENDENT_AMBULATORY_CARE_PROVIDER_SITE_OTHER): Payer: Managed Care, Other (non HMO) | Admitting: Neurology

## 2020-10-24 ENCOUNTER — Other Ambulatory Visit: Payer: Self-pay

## 2020-10-24 DIAGNOSIS — G4731 Primary central sleep apnea: Secondary | ICD-10-CM

## 2020-10-24 DIAGNOSIS — G8929 Other chronic pain: Secondary | ICD-10-CM

## 2020-10-24 DIAGNOSIS — F458 Other somatoform disorders: Secondary | ICD-10-CM

## 2020-10-24 DIAGNOSIS — G4701 Insomnia due to medical condition: Secondary | ICD-10-CM

## 2020-10-24 DIAGNOSIS — F5102 Adjustment insomnia: Secondary | ICD-10-CM

## 2020-10-24 DIAGNOSIS — G4719 Other hypersomnia: Secondary | ICD-10-CM

## 2020-10-31 NOTE — Progress Notes (Signed)
Please let patient know that supply chain problems can affect the waiting period until he receives this new BIPAP ST.  POLYSOMNOGRAPHY IMPRESSION:   1. [place] Obstructive Sleep Apnea (OSA) manifested at AHI 97/h let to SPLIT night protocol being implemented. CPAP did not alleviate the AHI enough, BiPAP was used with an ST function at 10/min with a final explored setting of 17/12 cm water - allowing for sleep observed over 46 minutes including REM sleep.      RECOMMENDATIONS: Final Bilevel ventilation with 5 cm pressure differential setting was : humidified BiPAP at pressures of 17/12 cm water, ST function at 10 /min, and a Vitera FFM .    A follow up appointment will be scheduled in the Sleep Clinic at [facility].

## 2020-10-31 NOTE — Procedures (Signed)
PATIENT'S NAME:  Bryan Gilbert, Bryan Gilbert DOB:      01-25-1966      MR#:    [id number]     DATE OF RECORDING: 10/24/2020 MR REFERRING M.D.:  Sallee Lange, MD Study Performed:  Split-Night Titration Study HISTORY:   Bryan Gilbert is a 55 - year- old Caucasian male patient seen on 09/25/2020    Bryan Gilbert has been a compliant CPAP user for well over a decade.  He received his original machine from a durable medical equipment [employer].  He has not had a reevaluation of sleep apnea nor a new machine since initially beginning this treatment.   He has used a full facemask by [employer] by description this seems to be the F 20.  He continues to be a compliant user but data are not available from the machine of this age.  He has actually used a BiPAP function.  He now reports that he sleeps only 3 to 6 hours on average at night and that he has become more sleepy in daytime again endorsing the Epworth sleepiness score at 13 points.  Other factors of health and lifestyle may also contribute to the excessive sleepiness, the patient presented here with some tachycardia regular heart rate of 111 bpm, according to his primary care physician there has been some weight gain.  He also has some facial hair which may affect the seal and efficacy of a full facemask.   The patient endorsed the Epworth Sleepiness Scale at 13 points   The patient's weight 233 pounds with a height of 67 (inches), resulting in a BMI of 36.7 kg/m2. The patient's neck circumference measured 17.5 inches.  CURRENT MEDICATIONS: Stadol, Flexeril, Norco, Levodromoran, Zestoretic, Imodium, Mobic, saline nasal spray, Viagra, Mylodon, Testosterone   PROCEDURE:  This is a multichannel digital polysomnogram utilizing the Somnostar 11.2 system.  Electrodes and sensors were applied and monitored per AASM Specifications.   EEG, EOG, Chin and Limb EMG, were sampled at 200 Hz.  ECG, Snore and Nasal Pressure, Thermal Airflow, Respiratory Effort, CPAP Flow and  Pressure, Oximetry was sampled at 50 Hz. Digital video and audio were recorded.      BASELINE STUDY WITHOUT CPAP RESULTS: Lights Out was at 21:35 and Lights On at 04:52.  Total recording time (TRT) was 71.5, with a total sleep time (TST) of 63 minutes.    The patient's sleep latency was 5 minutes.  REM slep was seen for  0 minutes.  The sleep efficiency was 88.1 %.   SLEEP ARCHITECTURE: WASO (Wake after sleep onset) was 1 minutes, Stage N1 was 5 minutes, Stage N2 was 58 minutes, Stage N3 was 0 minutes and Stage R (REM sleep) was 0 minutes.   The percentages were Stage N1 7.9%, Stage N2 92.1%, Stage N3 0% and Stage R (REM sleep) 0%.   RESPIRATORY ANALYSIS:  There were a total of 101 respiratory events:  94 obstructive apneas, 5 central apneas and 1 mixed apnea with 1 hypopnea.  Snoring was noted.   The total APNEA/HYPOPNEA INDEX (AHI) was 96.2 /hour and the total RESPIRATORY DISTURBANCE INDEX was 96.2 /hour.  0 events occurred in REM sleep and 8 events in NREM. The supine AHI was 96.2 /hour.  OXYGEN SATURATION & C02:  The wake baseline 02 saturation was 94%, with the lowest being 82%. Time spent below 89% saturation equaled 32 minutes.  The arousals were noted as: 6 were spontaneous, 0 were associated with PLMs, 80 were associated with respiratory events. The  patient had a total of 0 Periodic Limb Movements.    Audio and video analysis did not show any abnormal or unusual movements, behaviors, phonations or vocalizations Snoring was noted EKG was in keeping with normal sinus rhythm (NSR)  TITRATION STUDY WITH CPAP and BiPAP; RESULTS:   CPAP was initiated at 5 cmH20 under a FFM Vitera in medium size- with heated humidity per AASM split night standards and pressure was advanced to 13 cm water CPAP, when the AHI reached 5.1/h but snoring remained present- Switch to BiPAP, first at 15/10 cm water, from there to BiPAP ST 17/12 cmH20, Rate = 12 because of hypopneas, apneas and desaturations.  At a ST  BIPAP pressure of 17/12 cmH20, there was a reduction of the AHI to 2.6 /hour.  Total recording time (TRT) was 365.5 minutes, with a total sleep time (TST) of 333.5 minutes. The patient's sleep latency was 29 minutes. REM latency was 115.5 minutes.  The sleep efficiency was 91.2 %.    SLEEP ARCHITECTURE: Wake after sleep was 28 minutes, Stage N1 20.5 minutes, Stage N2 237 minutes, Stage N3 1.5 minutes and Stage R (REM sleep) 74.5 minutes. The percentages were: Stage N1 6.1%, Stage N2 71.1%, Stage N3 .4% and Stage R (REM sleep) 22.3%.  RESPIRATORY ANALYSIS:  There were a total of 144 respiratory events: 107 obstructive apneas, 15 central apneas and 3 mixed apneas with 19 hypopneas.  The total APNEA/HYPOPNEA INDEX (AHI) was 25.9 /hour.  3 events occurred in REM sleep and 141 events in NREM. The REM AHI was 2.4 /hour versus a non-REM AHI of 32.7 /hour. The patient spent 16% of total sleep time in the supine position. The supine AHI was 13.2 /hour, versus a non-supine AHI of 28.4/hour.  OXYGEN SATURATION & C02:  The wake baseline 02 saturation was 95%, with the lowest being 86%. Time spent below 89% saturation equaled 4 minutes. The arousals were noted as: 29 were spontaneous, 0 were associated with PLMs, 92 were associated with respiratory events. The patient had a total of 0 Periodic Limb Movements.   POLYSOMNOGRAPHY IMPRESSION :   1. Severest Obstructive Sleep Apnea (OSA) manifested at AHI 97/h let to SPLIT night protocol being implemented. CPAP did not alleviate the AHI enough, BiPAP was used with an ST function at 10/min with a final explored setting of 17/12 cm water - allowing for sleep observed over 46 minutes including REM sleep.      RECOMMENDATIONS: final 5 cm pressure differential setting was : humidified BiPAP at pressures of 17/12 cm water, ST function at 10 /min, and a Vitera FFM .    A follow up appointment will be scheduled in the Sleep Clinic at [facility].       I certify that I have reviewed the entire raw data recording prior to the issuance of this report in accordance with the Standards of Accreditation of the American Academy of Sleep Medicine (AASM)    Larey Seat, M.D. Diplomat, Tax adviser of Psychiatry and Neurology  Diplomat, Tax adviser of Sleep Medicine Market researcher, [employer] [facility] at [employer]

## 2020-10-31 NOTE — Addendum Note (Signed)
Addended by: Larey Seat on: 10/31/2020 06:42 PM   Modules accepted: Orders

## 2020-11-04 ENCOUNTER — Encounter: Payer: Self-pay | Admitting: Neurology

## 2020-11-04 ENCOUNTER — Telehealth: Payer: Self-pay | Admitting: Neurology

## 2020-11-04 NOTE — Telephone Encounter (Signed)
Called patient to discuss sleep study results. No answer at this time. LVM for the patient to call back.  Will send a mychart message as well. 

## 2020-11-04 NOTE — Telephone Encounter (Signed)
-----   Message from Larey Seat, MD sent at 10/31/2020  6:42 PM EDT ----- Please let patient know that supply chain problems can affect the waiting period until he receives this new BIPAP ST.  POLYSOMNOGRAPHY IMPRESSION:   1. Severest Obstructive Sleep Apnea (OSA) manifested at AHI 97/h let to SPLIT night protocol being implemented. CPAP did not alleviate the AHI enough, BiPAP was used with an ST function at 10/min with a final explored setting of 17/12 cm water - allowing for sleep observed over 46 minutes including REM sleep.      RECOMMENDATIONS: Final Bilevel ventilation with 5 cm pressure differential setting was : humidified BiPAP at pressures of 17/12 cm water, ST function at 10 /min, and a Vitera FFM .    A follow up appointment will be scheduled in the Sleep Clinic at [facility].

## 2020-12-05 ENCOUNTER — Encounter: Payer: Self-pay | Admitting: Family Medicine

## 2020-12-05 DIAGNOSIS — L918 Other hypertrophic disorders of the skin: Secondary | ICD-10-CM

## 2020-12-05 NOTE — Addendum Note (Signed)
Addended by: Vicente Males on: 12/05/2020 11:48 AM   Modules accepted: Orders

## 2020-12-05 NOTE — Telephone Encounter (Signed)
May have derm refer to dr hall mcconnel

## 2020-12-23 ENCOUNTER — Other Ambulatory Visit: Payer: Self-pay | Admitting: Urology

## 2020-12-25 ENCOUNTER — Encounter: Payer: Self-pay | Admitting: Neurology

## 2021-01-29 ENCOUNTER — Ambulatory Visit: Payer: Managed Care, Other (non HMO) | Admitting: Urology

## 2021-01-29 NOTE — Telephone Encounter (Signed)
Received a message from [facility]).  I have sent the pt a mychart message as well considering he had reached out before stating that he had not heard anything.  "Multiple attempts made to contact pt. Then Carleene Overlie reached out via email since he wasn't calling back to see if we can schedule. He responded concerned about how we got his email, never would answer email about if he wanted the device or not, and will not answer calls today. At this time I need to cancel this order and move on to the next pt in line for a device. He has CSA, so please let Dr. Keturah Barre know. I can only hope he found another provider and has therapy, but if not I want her to know."

## 2021-01-30 ENCOUNTER — Ambulatory Visit: Payer: Managed Care, Other (non HMO) | Admitting: Family Medicine

## 2021-01-31 ENCOUNTER — Ambulatory Visit (INDEPENDENT_AMBULATORY_CARE_PROVIDER_SITE_OTHER): Payer: Managed Care, Other (non HMO) | Admitting: Family Medicine

## 2021-01-31 ENCOUNTER — Other Ambulatory Visit: Payer: Self-pay

## 2021-01-31 ENCOUNTER — Encounter: Payer: Self-pay | Admitting: Family Medicine

## 2021-01-31 VITALS — BP 114/72 | Temp 97.7°F | Wt 221.4 lb

## 2021-01-31 DIAGNOSIS — Z125 Encounter for screening for malignant neoplasm of prostate: Secondary | ICD-10-CM | POA: Diagnosis not present

## 2021-01-31 DIAGNOSIS — J019 Acute sinusitis, unspecified: Secondary | ICD-10-CM

## 2021-01-31 DIAGNOSIS — F439 Reaction to severe stress, unspecified: Secondary | ICD-10-CM

## 2021-01-31 DIAGNOSIS — I10 Essential (primary) hypertension: Secondary | ICD-10-CM

## 2021-01-31 DIAGNOSIS — E7849 Other hyperlipidemia: Secondary | ICD-10-CM | POA: Diagnosis not present

## 2021-01-31 DIAGNOSIS — B9689 Other specified bacterial agents as the cause of diseases classified elsewhere: Secondary | ICD-10-CM

## 2021-01-31 DIAGNOSIS — E291 Testicular hypofunction: Secondary | ICD-10-CM

## 2021-01-31 DIAGNOSIS — R7303 Prediabetes: Secondary | ICD-10-CM

## 2021-01-31 MED ORDER — AMOXICILLIN 500 MG PO CAPS: ORAL_CAPSULE | ORAL | 0 refills | Status: DC

## 2021-01-31 MED ORDER — SERTRALINE HCL 50 MG PO TABS: ORAL_TABLET | ORAL | 1 refills | Status: DC

## 2021-01-31 NOTE — Progress Notes (Signed)
   Subjective:    Patient ID: Bryan Gilbert, male    DOB: 15-May-1966, 55 y.o.   MRN: [id number]  HPI Pt here for follow up on HTN. Pt states he does not check blood pressure at home; has other appts he gets his BP checked at.   CPAP new machine Primary hypertension - Plan: PSA, Lipid Profile, Hemoglobin A1c  Prediabetes - Plan: PSA, Lipid Profile, Hemoglobin A1c  Screening PSA (prostate specific antigen) - Plan: PSA, Lipid Profile, Hemoglobin A1c  Other hyperlipidemia - Plan: PSA, Lipid Profile, Hemoglobin A1c  Stress seemingly doing pretty good starting Zoloft again.  Doing well with this.  Moderate sinus symptoms sinus pressure denies wheezing difficulty breathing   Review of Systems     Objective:   Physical Exam General-in no acute distress Eyes-no discharge Lungs-respiratory rate normal, CTA CV-no murmurs,RRR Extremities skin warm dry no edema Neuro grossly normal Behavior normal, alert        Assessment & Plan:  1. Primary hypertension Blood pressure good control continue current measures watch diet closely if he continues to lose weight and blood pressure comes down further adjust medication - PSA - Lipid Profile - Hemoglobin A1c  2. Prediabetes History of prediabetes check A1c await results - PSA - Lipid Profile - Hemoglobin A1c  3. Screening PSA (prostate specific antigen) Screening PSA recommended - PSA - Lipid Profile - Hemoglobin A1c  4. Other hyperlipidemia Hyperlipidemia continue to watch diet closely. - PSA - Lipid Profile - Hemoglobin A1c  5. Acute bacterial rhinosinusitis Prescribed antibiotic.  Follow-up if ongoing troubles or worse  6. Stress Patient restarted Zoloft continue current measures at

## 2021-01-31 NOTE — Addendum Note (Signed)
Addended by: Sallee Lange A on: 01/31/2021 09:46 PM   Modules accepted: Level of Service

## 2021-01-31 NOTE — Patient Instructions (Signed)

## 2021-02-01 LAB — HEMOGLOBIN A1C
Est. average glucose Bld gHb Est-mCnc: 128 mg/dL
Hgb A1c MFr Bld: 6.1 % — ABNORMAL HIGH (ref 4.8–5.6)

## 2021-02-01 LAB — LIPID PANEL
Chol/HDL Ratio: 5.4 ratio — ABNORMAL HIGH (ref 0.0–5.0)
Cholesterol, Total: 223 mg/dL — ABNORMAL HIGH (ref 100–199)
HDL: 41 mg/dL (ref >39)
LDL Chol Calc (NIH): 132 mg/dL — ABNORMAL HIGH (ref 0–99)
Triglycerides: 279 mg/dL — ABNORMAL HIGH (ref 0–149)
VLDL Cholesterol Cal: 50 mg/dL — ABNORMAL HIGH (ref 5–40)

## 2021-02-01 LAB — PSA: Prostate Specific Ag, Serum: 0.3 ng/mL (ref 0.0–4.0)

## 2021-02-05 LAB — COMPREHENSIVE METABOLIC PANEL
ALT: 57 IU/L — ABNORMAL HIGH (ref 0–44)
AST: 27 IU/L (ref 0–40)
Albumin/Globulin Ratio: 1.6 (ref 1.2–2.2)
Albumin: 4.5 g/dL (ref 3.8–4.9)
Alkaline Phosphatase: 98 IU/L (ref 44–121)
BUN/Creatinine Ratio: 23 — ABNORMAL HIGH (ref 9–20)
BUN: 21 mg/dL (ref 6–24)
Bilirubin Total: 0.6 mg/dL (ref 0.0–1.2)
CO2: 25 mmol/L (ref 20–29)
Calcium: 10.3 mg/dL — ABNORMAL HIGH (ref 8.7–10.2)
Chloride: 101 mmol/L (ref 96–106)
Creatinine, Ser: 0.92 mg/dL (ref 0.76–1.27)
Globulin, Total: 2.9 g/dL (ref 1.5–4.5)
Glucose: 98 mg/dL (ref 65–99)
Potassium: 4.4 mmol/L (ref 3.5–5.2)
Sodium: 144 mmol/L (ref 134–144)
Total Protein: 7.4 g/dL (ref 6.0–8.5)
eGFR: 99 mL/min/{1.73_m2} (ref >59)

## 2021-02-05 LAB — CBC WITH DIFFERENTIAL/PLATELET
Basophils Absolute: 0.1 10*3/uL (ref 0.0–0.2)
Basos: 1 %
EOS (ABSOLUTE): 0.1 10*3/uL (ref 0.0–0.4)
Eos: 1 %
Hematocrit: 53.3 % — ABNORMAL HIGH (ref 37.5–51.0)
Hemoglobin: 18.2 g/dL — ABNORMAL HIGH (ref 13.0–17.7)
Immature Grans (Abs): 0.1 10*3/uL (ref 0.0–0.1)
Immature Granulocytes: 1 %
Lymphocytes Absolute: 3.3 10*3/uL — ABNORMAL HIGH (ref 0.7–3.1)
Lymphs: 34 %
MCH: 30.1 pg (ref 26.6–33.0)
MCHC: 34.1 g/dL (ref 31.5–35.7)
MCV: 88 fL (ref 79–97)
Monocytes Absolute: 1 10*3/uL — ABNORMAL HIGH (ref 0.1–0.9)
Monocytes: 10 %
Neutrophils Absolute: 5.3 10*3/uL (ref 1.4–7.0)
Neutrophils: 53 %
Platelets: 369 10*3/uL (ref 150–450)
RBC: 6.04 x10E6/uL — ABNORMAL HIGH (ref 4.14–5.80)
RDW: 12.6 % (ref 11.6–15.4)
WBC: 9.8 10*3/uL (ref 3.4–10.8)

## 2021-02-05 LAB — TESTOSTERONE,FREE AND TOTAL
Testosterone, Free: 9.1 pg/mL (ref 7.2–24.0)
Testosterone: 343 ng/dL (ref 264–916)

## 2021-03-04 ENCOUNTER — Encounter: Payer: Self-pay | Admitting: Urology

## 2021-03-04 ENCOUNTER — Other Ambulatory Visit: Payer: Self-pay

## 2021-03-04 ENCOUNTER — Telehealth (INDEPENDENT_AMBULATORY_CARE_PROVIDER_SITE_OTHER): Payer: Managed Care, Other (non HMO) | Admitting: Urology

## 2021-03-04 DIAGNOSIS — E291 Testicular hypofunction: Secondary | ICD-10-CM

## 2021-03-04 MED ORDER — TESTOSTERONE 20.25 MG/ACT (1.62%) TD GEL: 3.0000 | Freq: Every morning | TRANSDERMAL | 4 refills | Status: DC

## 2021-03-04 NOTE — Patient Instructions (Signed)
Testosterone Skin Gel What is this medication? TESTOSTERONE (tes TOS ter one) is used to increase testosterone levels in yourbody. It belongs to a group of medications called androgen hormones. This medicine may be used for other purposes; ask your health care provider orpharmacist if you have questions. COMMON BRAND NAME(S): AndroGel, FORTESTA, Testim, Vogelxo What should I tell my care team before I take this medication? They need to know if you have any of these conditions: Breast cancer Diabetes Heart disease Heart failure If a male partner is pregnant or trying to get pregnant Kidney disease Liver disease Lung or breathing disease (asthma, COPD) Polycythemia Prostate cancer or disease Sleep apnea An unusual or allergic reaction to testosterone, other medications, foods, dyes, or preservatives If a male partner is pregnant or trying to get pregnant Breast-feeding How should I use this medication? This medication is for external use only. Do not take by mouth. Wash your hands before and after use. Use it as directed on the prescription label, at the same time every day. Do not use it more often than directed. Make sure that you are using your product correctly. Allow the skin to air-dry, then cover with clothing to prevent others from coming in contact with the medication on yourskin. This medication comes with INSTRUCTIONS FOR USE. Ask your pharmacist for directions on how to use this medication. Read the information carefully. Talkto your pharmacist or care team if you have questions. A special MedGuide will be given to you by the pharmacist with eachprescription and refill. Be sure to read this information carefully each time. Talk to your care team regarding the use of this medication in children.Special care may be needed. Overdosage: If you think you have taken too much of this medicine contact apoison control center or emergency room at once. NOTE: This medicine is only for  you. Do not share this medicine with others. What if I miss a dose? If you miss a dose, use it as soon as you can. If it is almost time for yournext dose, use only that dose. Do not use double or extra doses. What may interact with this medication? Medications for diabetes Medications that treat or prevent blood clots like warfarin Steroid medications like prednisone or cortisone This list may not describe all possible interactions. Give your health care provider a list of all the medicines, herbs, non-prescription drugs, or dietary supplements you use. Also tell them if you smoke, drink alcohol, or use illegaldrugs. Some items may interact with your medicine. What should I watch for while using this medication? Visit your care team for regular checks on your progress. Tell your care team if your symptoms do not start to get better or if they get worse.You may needblood work while you are taking this medication. This medication is for use in men who have low levels of testosterone related to certain medical conditions. Heart attacks and strokes have been reported with the use of this medication. Get emergency help if you develop signs or symptoms of a heart attack or stroke. Talk to your care team about the risksand benefits of this medication. This medication can transfer from your body to others. If a person or pet comes in contact with the medication, they may have a serious risk of side effects. If you cannot avoid skin-to-skin contact, cover the medication site with clothing. If accidental contact happens, wash the skin of the person or pet right away with soap and water. Also, a male partner who is pregnant  ortrying to get pregnant should avoid contact with the gel or treated skin. Do not become pregnant while taking this medication. Women should inform their care team if they wish to become pregnant or think they might be pregnant. There is potential for serious harm to an unborn child. Tell  your care team formore information. This medication may affect blood sugar. Ask your care team if changes in dietor medications are needed if you have diabetes. This medication is banned from use in athletes by most athletic organizations. What side effects may I notice from receiving this medication? Side effects that you should report to your care team as soon as possible: Allergic reactions-skin rash, itching, hives, swelling of the face, lips, tongue, or throat Blood clot-pain, swelling, or warmth in the leg, shortness of breath, chest pain Heart attack-pain or tightness in the chest, shoulders, arms, or jaw, nausea, shortness of breath, cold or clammy skin, feeling faint or lightheaded Increase in blood pressure Liver injury-right upper belly pain, loss of appetite, nausea, light-colored stool, dark yellow or brown urine, yellowing of the skin or eyes, unusual weakness or fatigue Mood swings, irritability or hostility Prolonged or painful erection Sleep apnea-loud snoring, gasping during sleep, daytime sleepiness Stroke-sudden numbness or weakness of the face, arm, or leg, trouble speaking, confusion, trouble walking, loss of balance or coordination, dizziness, severe headache, change in vision Swelling of the ankles, hands, or feet Thoughts of suicide or self-harm, worsening mood, feelings of depression Side effects that usually do not require medical attention (report these toyour care team if they continue or are bothersome): Acne Change in sex drive or performance Irritation at application site Unexpected breast tissue growth This list may not describe all possible side effects. Call your doctor for medical advice about side effects. You may report side effects to FDA at1-800-FDA-1088. Where should I keep my medication? Keep out of the reach of children and pets. This medication can be abused. Keep your medication in a safe place to protect it from theft. Do not share this medication  with anyone. Selling or giving away this medication is dangerous andagainst the law. Store at room temperature between 20 and 25 degrees C (68 to 77 degrees F). Keep closed until use. Protect from heat and light. This medication is flammable. Avoid exposure to heat, fire, flame, and smoking. Throw away anyunused medication after the expiration date. NOTE: This sheet is a summary. It may not cover all possible information. If you have questions about this medicine, talk to your doctor, pharmacist, orhealth care provider.  2022 Elsevier/Gold Standard (2020-09-03 14:12:24)

## 2021-03-04 NOTE — Progress Notes (Signed)
03/04/2021 2:54 PM   Bryan Gilbert October 02, 1965 [id number]  Referring provider: Kathyrn Drown, MD [address],  [place] [address]  Patient location: home Physician location: office I connected with  Taliq Chilcoat Shuford on 03/04/21 by a video enabled telemedicine application and verified that I am speaking with the correct person using two identifiers.   I discussed the limitations of evaluation and management by telemedicine. The patient expressed understanding and agreed to proceed.    Followup hypogonadism   HPI: Mr Bryan Gilbert is a 55yo here for followup for hypogonadism. Testosterone 342, CMP normal, Hgb 18.2. PSA 0.3. He is on androgel 1.62% 2 pumps daily. Good energy. Good libido. No other complaints today.    PMH: Past Medical History:  Diagnosis Date   Abdominal migraine    Cervical neuralgia    Cyclical vomiting    Elevated hemoglobin (HCC)    Hypertension    Sleep apnea    states uses O2 and CPAP at night   Slipped intervertebral disc     Surgical History: Past Surgical History:  Procedure Laterality Date   ESOPHAGOGASTRODUODENOSCOPY      Home Medications:  Allergies as of 03/04/2021       Reactions   Cefprozil Diarrhea, Nausea Only   Intestinal upset and diarrhea   Lipitor [atorvastatin] Other (See Comments)   Myalgia   Xtandi [enzalutamide]    GI upset        Medication List        Accurate as of March 04, 2021  2:54 PM. If you have any questions, ask your nurse or doctor.          amoxicillin 500 MG capsule Commonly known as: AMOXIL Take one capsule po TID for 10 days   butorphanol 10 MG/ML nasal spray Commonly known as: STADOL Place 1 spray into the nose every 4 (four) hours as needed for headache or migraine.   cyclobenzaprine 10 MG tablet Commonly known as: FLEXERIL Take 10 mg by mouth 3 (three) times daily as needed for muscle spasms.   HYDROcodone-acetaminophen 10-325 MG tablet Commonly known as: NORCO Take 1  tablet by mouth every 4 (four) hours as needed for moderate pain.   levorphanol 2 MG tablet Commonly known as: LEVODROMORAN   lisinopril-hydrochlorothiazide 10-12.5 MG tablet Commonly known as: ZESTORETIC Take 1 tablet by mouth daily.   loperamide 2 MG capsule Commonly known as: IMODIUM Take 2 mg by mouth as needed for diarrhea or loose stools.   meloxicam 15 MG tablet Commonly known as: MOBIC Take 1 tablet (15 mg total) by mouth daily.   SALINE NASAL SPRAY NA Place 1 spray into the nose daily as needed (dryness). Reported on 09/17/2015   sertraline 50 MG tablet Commonly known as: ZOLOFT Take one tablet po every day   sildenafil 100 MG tablet Commonly known as: VIAGRA TAKE 1/2 TO 1 TABLET ONE HOUR BEFORE NEEDED   simethicone 80 MG chewable tablet Commonly known as: MYLICON Chew 80 mg by mouth every 6 (six) hours as needed for flatulence.   Testosterone 20.25 MG/ACT (1.62%) Gel APPLY 2 PUMP TOPICALLY IN THE MORNING        Allergies:  Allergies  Allergen Reactions   Cefprozil Diarrhea and Nausea Only    Intestinal upset and diarrhea   Lipitor [Atorvastatin] Other (See Comments)    Myalgia   Xtandi [Enzalutamide]     GI upset    Family History: Family History  Problem Relation Age of Onset  Heart failure Father    Hypertension Father     Social History:  reports that he has never smoked. He has never used smokeless tobacco. He reports that he does not drink alcohol and does not use drugs.  ROS: All other review of systems were reviewed and are negative except what is noted above in HPI   Laboratory Data: Lab Results  Component Value Date   WBC 9.8 01/31/2021   HGB 18.2 (H) 01/31/2021   HCT 53.3 (H) 01/31/2021   MCV 88 01/31/2021   PLT 369 01/31/2021    Lab Results  Component Value Date   CREATININE 0.92 01/31/2021    No results found for: PSA  Lab Results  Component Value Date   TESTOSTERONE 343 01/31/2021    Lab Results  Component  Value Date   HGBA1C 6.1 (H) 01/31/2021    Urinalysis    Component Value Date/Time   COLORURINE YELLOW 04/16/2020 1319   APPEARANCEUR HAZY (A) 04/16/2020 1319   LABSPEC 1.021 04/16/2020 1319   PHURINE 7.0 04/16/2020 1319   GLUCOSEU NEGATIVE 04/16/2020 1319   HGBUR NEGATIVE 04/16/2020 1319   BILIRUBINUR NEGATIVE 04/16/2020 1319   KETONESUR 5 (A) 04/16/2020 1319   PROTEINUR 30 (A) 04/16/2020 1319   UROBILINOGEN 0.2 05/19/2013 1732   NITRITE NEGATIVE 04/16/2020 1319   LEUKOCYTESUR NEGATIVE 04/16/2020 1319    Lab Results  Component Value Date   BACTERIA RARE (A) 04/16/2020    Pertinent Imaging:  No results found for this or any previous visit.  No results found for this or any previous visit.  No results found for this or any previous visit.  No results found for this or any previous visit.  No results found for this or any previous visit.  No results found for this or any previous visit.  No results found for this or any previous visit.  No results found for this or any previous visit.   Assessment & Plan:    1. Male hypogonadism Increase androgel 1.62% 3 pumps daily RTC 3 months with testosterone labs    No follow-ups on file.  Nicolette Bang, MD  [facility]

## 2021-03-15 ENCOUNTER — Other Ambulatory Visit: Payer: Self-pay | Admitting: Family Medicine

## 2021-03-17 ENCOUNTER — Other Ambulatory Visit: Payer: Self-pay | Admitting: Family Medicine

## 2021-05-27 ENCOUNTER — Other Ambulatory Visit: Payer: Managed Care, Other (non HMO)

## 2021-05-27 ENCOUNTER — Other Ambulatory Visit: Payer: Self-pay

## 2021-05-27 DIAGNOSIS — E291 Testicular hypofunction: Secondary | ICD-10-CM

## 2021-06-01 LAB — CBC WITH DIFFERENTIAL
Basophils Absolute: 0.1 10*3/uL (ref 0.0–0.2)
Basos: 1 %
EOS (ABSOLUTE): 0.1 10*3/uL (ref 0.0–0.4)
Eos: 1 %
Hematocrit: 54.5 % — ABNORMAL HIGH (ref 37.5–51.0)
Hemoglobin: 18.4 g/dL — ABNORMAL HIGH (ref 13.0–17.7)
Immature Grans (Abs): 0.1 10*3/uL (ref 0.0–0.1)
Immature Granulocytes: 1 %
Lymphocytes Absolute: 4.4 10*3/uL — ABNORMAL HIGH (ref 0.7–3.1)
Lymphs: 44 %
MCH: 29.1 pg (ref 26.6–33.0)
MCHC: 33.8 g/dL (ref 31.5–35.7)
MCV: 86 fL (ref 79–97)
Monocytes Absolute: 0.8 10*3/uL (ref 0.1–0.9)
Monocytes: 9 %
Neutrophils Absolute: 4.4 10*3/uL (ref 1.4–7.0)
Neutrophils: 44 %
RBC: 6.32 x10E6/uL — ABNORMAL HIGH (ref 4.14–5.80)
RDW: 12.1 % (ref 11.6–15.4)
WBC: 9.9 10*3/uL (ref 3.4–10.8)

## 2021-06-01 LAB — COMPREHENSIVE METABOLIC PANEL
ALT: 35 IU/L (ref 0–44)
AST: 18 IU/L (ref 0–40)
Albumin/Globulin Ratio: 1.8 (ref 1.2–2.2)
Albumin: 4.7 g/dL (ref 3.8–4.9)
Alkaline Phosphatase: 106 IU/L (ref 44–121)
BUN/Creatinine Ratio: 19 (ref 9–20)
BUN: 20 mg/dL (ref 6–24)
Bilirubin Total: 0.5 mg/dL (ref 0.0–1.2)
CO2: 25 mmol/L (ref 20–29)
Calcium: 9.6 mg/dL (ref 8.7–10.2)
Chloride: 99 mmol/L (ref 96–106)
Creatinine, Ser: 1.05 mg/dL (ref 0.76–1.27)
Globulin, Total: 2.6 g/dL (ref 1.5–4.5)
Glucose: 98 mg/dL (ref 70–99)
Potassium: 4.2 mmol/L (ref 3.5–5.2)
Sodium: 140 mmol/L (ref 134–144)
Total Protein: 7.3 g/dL (ref 6.0–8.5)
eGFR: 84 mL/min/{1.73_m2} (ref >59)

## 2021-06-01 LAB — TESTOSTERONE,FREE AND TOTAL
Testosterone, Free: 10.4 pg/mL (ref 7.2–24.0)
Testosterone: 530 ng/dL (ref 264–916)

## 2021-06-03 ENCOUNTER — Encounter: Payer: Self-pay | Admitting: Urology

## 2021-06-03 ENCOUNTER — Ambulatory Visit (INDEPENDENT_AMBULATORY_CARE_PROVIDER_SITE_OTHER): Payer: Managed Care, Other (non HMO) | Admitting: Urology

## 2021-06-03 ENCOUNTER — Other Ambulatory Visit: Payer: Self-pay

## 2021-06-03 DIAGNOSIS — E291 Testicular hypofunction: Secondary | ICD-10-CM | POA: Diagnosis not present

## 2021-06-03 MED ORDER — TESTOSTERONE 20.25 MG/ACT (1.62%) TD GEL: 3.0000 | Freq: Every morning | TRANSDERMAL | 4 refills | Status: DC

## 2021-06-03 NOTE — Progress Notes (Signed)
06/03/2021 2:53 PM   Bryan Gilbert 21-Nov-1965 [id number]  Referring provider: Kathyrn Drown, MD [place],  [place] [address]  Patient location: home Physician location: office I connected with  Ladainian Therien Bulger on 06/03/21 by a video enabled telemedicine application and verified that I am speaking with the correct person using two identifiers.   I discussed the limitations of evaluation and management by telemedicine. The patient expressed understanding and agreed to proceed.    Followup hypogonadism  HPI: Bryan Gilbert is a 55yo here for followup for hypogonadism. He uses 3 pumps 1.62% testosterone gel daily. Testosterone 530. Hgb 18.4. CMP normal. Good energy. No fatigue. Good libido. No issues with erections. No other complaints today.    PMH: Past Medical History:  Diagnosis Date   Abdominal migraine    Cervical neuralgia    Cyclical vomiting    Elevated hemoglobin (HCC)    Hypertension    Sleep apnea    states uses O2 and CPAP at night   Slipped intervertebral disc     Surgical History: Past Surgical History:  Procedure Laterality Date   ESOPHAGOGASTRODUODENOSCOPY      Home Medications:  Allergies as of 06/03/2021       Reactions   Cefprozil Diarrhea, Nausea Only   Intestinal upset and diarrhea   Lipitor [atorvastatin] Other (See Comments)   Myalgia   Xtandi [enzalutamide]    GI upset        Medication List        Accurate as of June 03, 2021  2:53 PM. If you have any questions, ask your nurse or doctor.          amoxicillin 500 MG capsule Commonly known as: AMOXIL Take one capsule po TID for 10 days   butorphanol 10 MG/ML nasal spray Commonly known as: STADOL Place 1 spray into the nose every 4 (four) hours as needed for headache or migraine.   cyclobenzaprine 10 MG tablet Commonly known as: FLEXERIL Take 10 mg by mouth 3 (three) times daily as needed for muscle spasms.   HYDROcodone-acetaminophen 10-325 MG  tablet Commonly known as: NORCO Take 1 tablet by mouth every 4 (four) hours as needed for moderate pain.   levorphanol 2 MG tablet Commonly known as: LEVODROMORAN   lisinopril-hydrochlorothiazide 10-12.5 MG tablet Commonly known as: ZESTORETIC TAKE 1 TABLET BY MOUTH EVERY DAY   loperamide 2 MG capsule Commonly known as: IMODIUM Take 2 mg by mouth as needed for diarrhea or loose stools.   meloxicam 15 MG tablet Commonly known as: MOBIC TAKE 1 TABLET (15 MG TOTAL) BY MOUTH DAILY.   SALINE NASAL SPRAY NA Place 1 spray into the nose daily as needed (dryness). Reported on 09/17/2015   sertraline 50 MG tablet Commonly known as: ZOLOFT Take one tablet po every day   sildenafil 100 MG tablet Commonly known as: VIAGRA TAKE 1/2 TO 1 TABLET ONE HOUR BEFORE NEEDED   simethicone 80 MG chewable tablet Commonly known as: MYLICON Chew 80 mg by mouth every 6 (six) hours as needed for flatulence.   Testosterone 20.25 MG/ACT (1.62%) Gel Apply 3 Pump topically in the morning.        Allergies:  Allergies  Allergen Reactions   Cefprozil Diarrhea and Nausea Only    Intestinal upset and diarrhea   Lipitor [Atorvastatin] Other (See Comments)    Myalgia   Xtandi [Enzalutamide]     GI upset    Family History: Family History  Problem  Relation Age of Onset   Heart failure Father    Hypertension Father     Social History:  reports that he has never smoked. He has never used smokeless tobacco. He reports that he does not drink alcohol and does not use drugs.  ROS: All other review of systems were reviewed and are negative except what is noted above in HPI   Laboratory Data: Lab Results  Component Value Date   WBC 9.9 05/27/2021   HGB 18.4 (H) 05/27/2021   HCT 54.5 (H) 05/27/2021   MCV 86 05/27/2021   PLT 369 01/31/2021    Lab Results  Component Value Date   CREATININE 1.05 05/27/2021    No results found for: PSA  Lab Results  Component Value Date   TESTOSTERONE  530 05/27/2021    Lab Results  Component Value Date   HGBA1C 6.1 (H) 01/31/2021    Urinalysis    Component Value Date/Time   COLORURINE YELLOW 04/16/2020 1319   APPEARANCEUR HAZY (A) 04/16/2020 1319   LABSPEC 1.021 04/16/2020 1319   PHURINE 7.0 04/16/2020 1319   GLUCOSEU NEGATIVE 04/16/2020 1319   HGBUR NEGATIVE 04/16/2020 1319   BILIRUBINUR NEGATIVE 04/16/2020 1319   KETONESUR 5 (A) 04/16/2020 1319   PROTEINUR 30 (A) 04/16/2020 1319   UROBILINOGEN 0.2 05/19/2013 1732   NITRITE NEGATIVE 04/16/2020 1319   LEUKOCYTESUR NEGATIVE 04/16/2020 1319    Lab Results  Component Value Date   BACTERIA RARE (A) 04/16/2020    Pertinent Imaging:  No results found for this or any previous visit.  No results found for this or any previous visit.  No results found for this or any previous visit.  No results found for this or any previous visit.  No results found for this or any previous visit.  No results found for this or any previous visit.  No results found for this or any previous visit.  No results found for this or any previous visit.   Assessment & Plan:    1. Male hypogonadism -Continue 3 pumps 1.62% testosterone gel -RTC 6 months with testosterone labs  -Patient instructed to donate blood since hgb is 18.4   No follow-ups on file.  Nicolette Bang, MD  [facility] [place]

## 2021-06-03 NOTE — Patient Instructions (Signed)
Testosterone Skin Gel What is this medication? TESTOSTERONE (tes TOS ter one) is used to increase testosterone levels in your body. It belongs to a group of medications called androgen hormones. This medicine may be used for other purposes; ask your health care provider or pharmacist if you have questions. COMMON BRAND NAME(S): AndroGel, FORTESTA, Testim, Vogelxo What should I tell my care team before I take this medication? They need to know if you have any of these conditions: Breast cancer Diabetes Heart disease Heart failure Kidney disease Liver disease Lung or breathing disease (asthma, COPD) Polycythemia Prostate cancer or disease Sleep apnea An unusual or allergic reaction to testosterone, other medications, foods, dyes, or preservatives If you or your partner are pregnant or trying to get pregnant Breast-feeding How should I use this medication? This medication is for external use only. Do not take by mouth. Wash your hands before and after use. Use it as directed on the prescription label, at the same time every day. Do not use it more often than directed. Make sure that you are using your product correctly. Allow the skin to air-dry, then cover with clothing to prevent others from coming in contact with the medication on your skin. This medication comes with INSTRUCTIONS FOR USE. Ask your pharmacist for directions on how to use this medication. Read the information carefully. Talk to your pharmacist or care team if you have questions. A special MedGuide will be given to you by the pharmacist with each prescription and refill. Be sure to read this information carefully each time. Talk to your care team regarding the use of this medication in children. Special care may be needed. Overdosage: If you think you have taken too much of this medicine contact a poison control center or emergency room at once. NOTE: This medicine is only for you. Do not share this medicine with  others. What if I miss a dose? If you miss a dose, use it as soon as you can. If it is almost time for your next dose, use only that dose. Do not use double or extra doses. What may interact with this medication? Medications for diabetes Medications that treat or prevent blood clots like warfarin Steroid medications like prednisone or cortisone This list may not describe all possible interactions. Give your health care provider a list of all the medicines, herbs, non-prescription drugs, or dietary supplements you use. Also tell them if you smoke, drink alcohol, or use illegal drugs. Some items may interact with your medicine. What should I watch for while using this medication? Visit your care team for regular checks on your progress. Tell your care team if your symptoms do not start to get better or if they get worse. You may need blood work while you are taking this medication. Heart attacks and strokes have been reported with the use of this medication. Call emergency services if you develop signs or symptoms of a heart attack or stroke. Talk to your care team about the risks and benefits of this medication. This medication can transfer from your body to others. If a person or pet comes in contact with the medication, they may have a serious risk of side effects. If you cannot avoid skin-to-skin contact, cover the medication site with clothing. If accidental contact happens, wash the skin of the person or pet right away with soap and water. A partner who is pregnant or trying to get pregnant should avoid contact with the medication and treated skin. Talk to your care  team if you wish to become pregnant or think you might be pregnant. This medication can cause serious birth defects. This medication may affect blood sugar. Ask your care team if changes in diet or medications are needed if you have diabetes. This medication is banned from use in athletes by most athletic organizations. What side  effects may I notice from receiving this medication? Side effects that you should report to your care team as soon as possible: Allergic reactions--skin rash, itching, hives, swelling of the face, lips, tongue, or throat Blood clot--pain, swelling, or warmth in the leg, shortness of breath, chest pain Heart attack--pain or tightness in the chest, shoulders, arms, or jaw, nausea, shortness of breath, cold or clammy skin, feeling faint or lightheaded Increase in blood pressure Liver injury--right upper belly pain, loss of appetite, nausea, light-colored stool, dark yellow or brown urine, yellowing of the skin or eyes, unusual weakness or fatigue Mood swings, irritability or hostility Prolonged or painful erection Sleep apnea--loud snoring, gasping during sleep, daytime sleepiness Stroke--sudden numbness or weakness of the face, arm, or leg, trouble speaking, confusion, trouble walking, loss of balance or coordination, dizziness, severe headache, change in vision Swelling of the ankles, hands, or feet Thoughts of suicide or self-harm, worsening mood, feelings of depression Side effects that usually do not require medical attention (report these to your care team if they continue or are bothersome): Acne Change in sex drive or performance Irritation at application site Unexpected breast tissue growth This list may not describe all possible side effects. Call your doctor for medical advice about side effects. You may report side effects to FDA at 1-800-FDA-1088. Where should I keep my medication? Keep out of the reach of children and pets. This medication can be abused. Keep your medication in a safe place to protect it from theft. Do not share this medication with anyone. Selling or giving away this medication is dangerous and against the law. Store at room temperature between 20 and 25 degrees C (68 to 77 degrees F). Keep closed until use. Protect from heat and light. This medication is flammable.  Avoid exposure to heat, fire, flame, and smoking. Throw away any unused medication after the expiration date. NOTE: This sheet is a summary. It may not cover all possible information. If you have questions about this medicine, talk to your doctor, pharmacist, or health care provider.  2022 Elsevier/Gold Standard (2021-03-11 00:00:00)

## 2021-06-04 ENCOUNTER — Ambulatory Visit: Payer: Managed Care, Other (non HMO) | Admitting: Urology

## 2021-07-18 ENCOUNTER — Other Ambulatory Visit: Payer: Self-pay | Admitting: Urology

## 2021-07-29 ENCOUNTER — Other Ambulatory Visit: Payer: Self-pay | Admitting: Family Medicine

## 2021-08-04 ENCOUNTER — Other Ambulatory Visit: Payer: Self-pay

## 2021-08-04 ENCOUNTER — Ambulatory Visit (INDEPENDENT_AMBULATORY_CARE_PROVIDER_SITE_OTHER): Payer: Managed Care, Other (non HMO) | Admitting: Family Medicine

## 2021-08-04 ENCOUNTER — Encounter: Payer: Self-pay | Admitting: Family Medicine

## 2021-08-04 VITALS — BP 128/78 | Wt 249.0 lb

## 2021-08-04 DIAGNOSIS — E7849 Other hyperlipidemia: Secondary | ICD-10-CM | POA: Diagnosis not present

## 2021-08-04 DIAGNOSIS — I10 Essential (primary) hypertension: Secondary | ICD-10-CM

## 2021-08-04 DIAGNOSIS — E669 Obesity, unspecified: Secondary | ICD-10-CM | POA: Diagnosis not present

## 2021-08-04 DIAGNOSIS — R7303 Prediabetes: Secondary | ICD-10-CM | POA: Diagnosis not present

## 2021-08-04 MED ORDER — MELOXICAM 15 MG PO TABS: 15.0000 mg | ORAL_TABLET | Freq: Every day | ORAL | 1 refills | Status: DC

## 2021-08-04 MED ORDER — SERTRALINE HCL 50 MG PO TABS: ORAL_TABLET | ORAL | 1 refills | Status: DC

## 2021-08-04 MED ORDER — LISINOPRIL-HYDROCHLOROTHIAZIDE 10-12.5 MG PO TABS: 1.0000 | ORAL_TABLET | Freq: Every day | ORAL | 1 refills | Status: DC

## 2021-08-04 MED ORDER — TADALAFIL 20 MG PO TABS: 10.0000 mg | ORAL_TABLET | ORAL | 11 refills | Status: DC | PRN

## 2021-08-04 NOTE — Progress Notes (Signed)
° °  Subjective:    Patient ID: Elwyn Lade Shuttleworth, male    DOB: 11-30-1965, 56 y.o.   MRN: [id number]  HPI Primary hypertension - Plan: Basic Metabolic Panel (BMET)  Prediabetes - Plan: Basic Metabolic Panel (BMET), Hemoglobin A1C  Other hyperlipidemia - Plan: Basic Metabolic Panel (BMET), Lipid Profile Doing well with blood pressure.  Takes his medicine watches salt states his diet has not been the greatest lately his weight did go up  Moderate obesity healthy diet was recommended.  Increased activity recommended.  Hyperlipidemia important take medication watch diet stay active  Prediabetes check A1c minimize starches stay active try to bring weight down   Review of Systems     Objective:   Physical Exam  General-in no acute distress Eyes-no discharge Lungs-respiratory rate normal, CTA CV-no murmurs,RRR Extremities skin warm dry no edema Neuro grossly normal Behavior normal, alert       Assessment & Plan:  1. Primary hypertension Blood pressure decent control continue medication watch diet stay active - Basic Metabolic Panel (BMET)  2. Prediabetes Minimize starches portion control try to bring weight down check H7W - Basic Metabolic Panel (BMET) - Hemoglobin A1C  3. Other hyperlipidemia Check cholesterol profile.  Await results - Basic Metabolic Panel (BMET) - Lipid Profile  4. Obesity (BMI 30-39.9) Portion control regular activity recommended  Refills on medication given follow-up in 6 months

## 2021-08-04 NOTE — Patient Instructions (Signed)

## 2021-08-08 ENCOUNTER — Ambulatory Visit: Payer: Managed Care, Other (non HMO) | Admitting: Family Medicine

## 2021-08-28 ENCOUNTER — Ambulatory Visit: Payer: Managed Care, Other (non HMO) | Admitting: Family Medicine

## 2021-08-28 ENCOUNTER — Telehealth: Payer: Self-pay

## 2021-08-28 NOTE — Telephone Encounter (Signed)
Rx for testosterone sent in for 2 pumps on 07/21/21. Patient states he is suppose to do 3 pumps.  Per last OV note on 06/03/21 it states to continue 3 pumps 1.62% testosterone gel RTC 6 months with testosterone labs  -Patient instructed to donate blood since hgb is 18.4  Please advise.

## 2021-09-01 ENCOUNTER — Other Ambulatory Visit: Payer: Self-pay | Admitting: Urology

## 2021-09-01 MED ORDER — TESTOSTERONE 20.25 MG/ACT (1.62%) TD GEL: 3.0000 | Freq: Every day | TRANSDERMAL | 3 refills | Status: DC

## 2021-09-26 ENCOUNTER — Ambulatory Visit: Payer: Managed Care, Other (non HMO) | Admitting: Family Medicine

## 2021-11-18 ENCOUNTER — Other Ambulatory Visit: Payer: Managed Care, Other (non HMO)

## 2021-11-18 DIAGNOSIS — E291 Testicular hypofunction: Secondary | ICD-10-CM

## 2021-11-20 LAB — COMPREHENSIVE METABOLIC PANEL
ALT: 27 IU/L (ref 0–44)
AST: 21 IU/L (ref 0–40)
Albumin/Globulin Ratio: 1.7 (ref 1.2–2.2)
Albumin: 4.3 g/dL (ref 3.8–4.9)
Alkaline Phosphatase: 93 IU/L (ref 44–121)
BUN/Creatinine Ratio: 19 (ref 9–20)
BUN: 18 mg/dL (ref 6–24)
Bilirubin Total: 0.7 mg/dL (ref 0.0–1.2)
CO2: 21 mmol/L (ref 20–29)
Calcium: 9.3 mg/dL (ref 8.7–10.2)
Chloride: 99 mmol/L (ref 96–106)
Creatinine, Ser: 0.97 mg/dL (ref 0.76–1.27)
Globulin, Total: 2.6 g/dL (ref 1.5–4.5)
Glucose: 208 mg/dL — ABNORMAL HIGH (ref 70–99)
Potassium: 4 mmol/L (ref 3.5–5.2)
Sodium: 138 mmol/L (ref 134–144)
Total Protein: 6.9 g/dL (ref 6.0–8.5)
eGFR: 92 mL/min/{1.73_m2} (ref >59)

## 2021-11-20 LAB — CBC WITH DIFFERENTIAL
Basophils Absolute: 0.1 10*3/uL (ref 0.0–0.2)
Basos: 1 %
EOS (ABSOLUTE): 0.2 10*3/uL (ref 0.0–0.4)
Eos: 2 %
Hematocrit: 56.8 % — ABNORMAL HIGH (ref 37.5–51.0)
Hemoglobin: 18.8 g/dL — ABNORMAL HIGH (ref 13.0–17.7)
Immature Grans (Abs): 0 10*3/uL (ref 0.0–0.1)
Immature Granulocytes: 1 %
Lymphocytes Absolute: 3.5 10*3/uL — ABNORMAL HIGH (ref 0.7–3.1)
Lymphs: 42 %
MCH: 27.5 pg (ref 26.6–33.0)
MCHC: 33.1 g/dL (ref 31.5–35.7)
MCV: 83 fL (ref 79–97)
Monocytes Absolute: 0.7 10*3/uL (ref 0.1–0.9)
Monocytes: 8 %
Neutrophils Absolute: 3.9 10*3/uL (ref 1.4–7.0)
Neutrophils: 46 %
RBC: 6.83 x10E6/uL — ABNORMAL HIGH (ref 4.14–5.80)
RDW: 16.1 % — ABNORMAL HIGH (ref 11.6–15.4)
WBC: 8.3 10*3/uL (ref 3.4–10.8)

## 2021-11-20 LAB — TESTOSTERONE,FREE AND TOTAL
Testosterone, Free: 19 pg/mL (ref 7.2–24.0)
Testosterone: 706 ng/dL (ref 264–916)

## 2021-11-20 LAB — PSA: Prostate Specific Ag, Serum: 0.3 ng/mL (ref 0.0–4.0)

## 2021-11-25 ENCOUNTER — Ambulatory Visit (INDEPENDENT_AMBULATORY_CARE_PROVIDER_SITE_OTHER): Payer: Managed Care, Other (non HMO) | Admitting: Urology

## 2021-11-25 VITALS — BP 136/85 | HR 106

## 2021-11-25 DIAGNOSIS — E291 Testicular hypofunction: Secondary | ICD-10-CM | POA: Diagnosis not present

## 2021-11-25 DIAGNOSIS — N486 Induration penis plastica: Secondary | ICD-10-CM | POA: Diagnosis not present

## 2021-11-25 MED ORDER — TESTOSTERONE 20.25 MG/ACT (1.62%) TD GEL: 3.0000 | Freq: Every day | TRANSDERMAL | 3 refills | Status: DC

## 2021-11-25 NOTE — Progress Notes (Signed)
11/25/2021 10:05 AM   Bryan Gilbert 11/04/1965 [id number]  Referring provider: Kathyrn Drown, MD [address] [place],  [place] [address]  Penile curvature and hypogonadism   HPI: Mr Bryan Gilbert is a 56yo here for followup for hypogonadism and new penile curvature. Over the past 2-3 months he has not left lateral curvature. He denies any pain with erections. The curvature is worsening. He has not felt a knot. No issues getting an erection. Testosterone 706, hgb 18.8. CMP normal. Good energy. Good libido. He uses androgel 2 pumps daily   PMH: Past Medical History:  Diagnosis Date   Abdominal migraine    Cervical neuralgia    Cyclical vomiting    Elevated hemoglobin (HCC)    Hypertension    Sleep apnea    states uses O2 and CPAP at night   Slipped intervertebral disc     Surgical History: Past Surgical History:  Procedure Laterality Date   ESOPHAGOGASTRODUODENOSCOPY      Home Medications:  Allergies as of 11/25/2021       Reactions   Cefprozil Diarrhea, Nausea Only   Intestinal upset and diarrhea   Lipitor [atorvastatin] Other (See Comments)   Myalgia   Xtandi [enzalutamide]    GI upset        Medication List        Accurate as of Nov 25, 2021 10:05 AM. If you have any questions, ask your nurse or doctor.          butorphanol 10 MG/ML nasal spray Commonly known as: STADOL Place 1 spray into the nose every 4 (four) hours as needed for headache or migraine.   cyclobenzaprine 10 MG tablet Commonly known as: FLEXERIL Take 10 mg by mouth 3 (three) times daily as needed for muscle spasms.   HYDROcodone-acetaminophen 10-325 MG tablet Commonly known as: NORCO Take 1 tablet by mouth every 4 (four) hours as needed for moderate pain.   levorphanol 2 MG tablet Commonly known as: LEVODROMORAN   lisinopril-hydrochlorothiazide 10-12.5 MG tablet Commonly known as: ZESTORETIC Take 1 tablet by mouth daily.   loperamide 2 MG capsule Commonly known as:  IMODIUM Take 2 mg by mouth as needed for diarrhea or loose stools.   meloxicam 15 MG tablet Commonly known as: MOBIC Take 1 tablet (15 mg total) by mouth daily.   SALINE NASAL SPRAY NA Place 1 spray into the nose daily as needed (dryness). Reported on 09/17/2015   sertraline 50 MG tablet Commonly known as: ZOLOFT Take one tablet po every day   simethicone 80 MG chewable tablet Commonly known as: MYLICON Chew 80 mg by mouth every 6 (six) hours as needed for flatulence.   tadalafil 20 MG tablet Commonly known as: CIALIS Take 0.5-1 tablets (10-20 mg total) by mouth every other day as needed for erectile dysfunction.   Testosterone 20.25 MG/ACT (1.62%) Gel Apply 3 Pump topically daily.        Allergies:  Allergies  Allergen Reactions   Cefprozil Diarrhea and Nausea Only    Intestinal upset and diarrhea   Lipitor [Atorvastatin] Other (See Comments)    Myalgia   Xtandi [Enzalutamide]     GI upset    Family History: Family History  Problem Relation Age of Onset   Heart failure Father    Hypertension Father     Social History:  reports that he has never smoked. He has never used smokeless tobacco. He reports that he does not drink alcohol and does not use drugs.  ROS: All other review of systems were reviewed and are negative except what is noted above in HPI  Physical Exam: BP 136/85   Pulse (!) 106   Constitutional:  Alert and oriented, No acute distress. HEENT: [place] AT, moist mucus membranes.  Trachea midline, no masses. Cardiovascular: No clubbing, cyanosis, or edema. Respiratory: Normal respiratory effort, no increased work of breathing. GI: Abdomen is soft, nontender, nondistended, no abdominal masses GU: No CVA tenderness. 1cm palpable dorsal distal penile shaft peyronies plaque Lymph: No cervical or inguinal lymphadenopathy. Skin: No rashes, bruises or suspicious lesions. Neurologic: Grossly intact, no focal deficits, moving all 4 extremities. Psychiatric:  Normal mood and affect.  Laboratory Data: Lab Results  Component Value Date   WBC 8.3 11/18/2021   HGB 18.8 (H) 11/18/2021   HCT 56.8 (H) 11/18/2021   MCV 83 11/18/2021   PLT 369 01/31/2021    Lab Results  Component Value Date   CREATININE 0.97 11/18/2021    No results found for: PSA  Lab Results  Component Value Date   TESTOSTERONE 706 11/18/2021    Lab Results  Component Value Date   HGBA1C 6.1 (H) 01/31/2021    Urinalysis    Component Value Date/Time   COLORURINE YELLOW 04/16/2020 1319   APPEARANCEUR HAZY (A) 04/16/2020 1319   LABSPEC 1.021 04/16/2020 1319   PHURINE 7.0 04/16/2020 1319   GLUCOSEU NEGATIVE 04/16/2020 1319   HGBUR NEGATIVE 04/16/2020 1319   BILIRUBINUR NEGATIVE 04/16/2020 1319   KETONESUR 5 (A) 04/16/2020 1319   PROTEINUR 30 (A) 04/16/2020 1319   UROBILINOGEN 0.2 05/19/2013 1732   NITRITE NEGATIVE 04/16/2020 1319   LEUKOCYTESUR NEGATIVE 04/16/2020 1319    Lab Results  Component Value Date   BACTERIA RARE (A) 04/16/2020    Pertinent Imaging:  No results found for this or any previous visit.  No results found for this or any previous visit.  No results found for this or any previous visit.  No results found for this or any previous visit.  No results found for this or any previous visit.  No results found for this or any previous visit.  No results found for this or any previous visit.  No results found for this or any previous visit.   Assessment & Plan:    1. Male hypogonadism Androgel 2 pumps daily RTC 3 months with testosterone labs Patient instructed to donate blood due to polycythemia  2. Peyronies disease We discussed the management of peyronies disease including medical therapy, penile plication, verapamil therapy and xiaflex therapy. After discussed the options the patient elects for medical therapy since he is in the active phase of peyronies disease. I will see him back in 3 months. We will trial trental '400mg'$   BID for 3 months    No follow-ups on file.  Nicolette Bang, MD  [facility] [place]

## 2021-12-08 ENCOUNTER — Encounter: Payer: Self-pay | Admitting: Urology

## 2021-12-08 NOTE — Patient Instructions (Signed)
Collagenase Injection (Dupuytren Contracture/Peyronie Disease) What is this medication? COLLAGENASE (kohl LAH jen ace) treats conditions caused by thickening of tissue in your body. It works by breaking down excess collagen in the tissue, which reduces stiffness and tightness. This medicine may be used for other purposes; ask your health care provider or pharmacist if you have questions. COMMON BRAND NAME(S): Xiaflex What should I tell my care team before I take this medication? They need to know if you have any of these conditions: Bleeding disorder An unusual or allergic reaction to collagenase, other medications, foods, dyes, or preservatives Pregnant or trying to get pregnant Breast-feeding How should I use this medication? This medication is injected into the affected area. It is given by your care team in a hospital or clinic setting. A special MedGuide will be given to you by the pharmacist with each prescription and refill. Be sure to read this information carefully each time. Talk to your care team about the use of this medication in children. Special care may be needed. Overdosage: If you think you have taken too much of this medicine contact a poison control center or emergency room at once. NOTE: This medicine is only for you. Do not share this medicine with others. What if I miss a dose? Keep appointments for follow-up doses. It is important not to miss your dose. Call your care team if you are unable to keep an appointment. What may interact with this medication? Aspirin and aspirin-like medications Certain medications that treat or prevent blood clots, such as warfarin, enoxaparin, dalteparin, apixaban, dabigatran, rivaroxaban This list may not describe all possible interactions. Give your health care provider a list of all the medicines, herbs, non-prescription drugs, or dietary supplements you use. Also tell them if you smoke, drink alcohol, or use illegal drugs. Some items may  interact with your medicine. What should I watch for while using this medication? Your condition will be monitored carefully while you are receiving this medication. If medication is for Dupuytren's Contracture, visit your care team 1 to 3 days after the injection. Until you visit your care team, do not flex or extend the fingers of your hand that was injected. Do not touch your finger that was injected. Elevate your hand until bedtime. Do not perform activity with the injected hand until you are told that it is ok. Follow any instructions about wearing a splint or performing finger exercises. Contact your care team as soon as possible if you get increasing redness or swelling in the hand, have numbness or tingling in the treated finger, or have trouble bending the finger after the swelling goes down. If medication is for Peyronie's disease, do not have sex between the first and second injections. Wait 4 weeks after the second injection and when there is no more pain or swelling in the penis to have sex. Avoid using vacuum erection devices during treatment with this medication. Try to avoid straining stomach muscles such as during bowel movements. Your care team will give you instructions on how to perform modeling activities at home. Contact your care team as soon as possible if you have severe pain or swelling in the penis, severe purple bruising and swelling of the penis, trouble passing urine, blood in urine, popping or cracking sound form the penis, or sudden loss of ability to maintain an erection. What side effects may I notice from receiving this medication? Side effects that you should report to your care team as soon as possible: Allergic reactions--skin rash,  itching, hives, swelling of the face, lips, tongue, or throat Feeling faint or lightheaded Skin infection--skin redness, swelling, warmth, or pain Severe back pain, chest pain, headache, trouble breathing after injection Snap or pop that  you feel or hear, severe pain, numbness, swelling, or bruising of or trouble moving in area where injected Side effects that usually do not require medical attention (report to your care team if they continue or are bothersome): Pain, redness, or irritation at injection site This list may not describe all possible side effects. Call your doctor for medical advice about side effects. You may report side effects to FDA at 1-800-FDA-1088. Where should I keep my medication? This medication is given in a hospital or clinic. It will not be stored at home. NOTE: This sheet is a summary. It may not cover all possible information. If you have questions about this medicine, talk to your doctor, pharmacist, or health care provider.  2023 Elsevier/Gold Standard (2021-06-06 00:00:00)  

## 2022-02-02 ENCOUNTER — Encounter: Payer: Self-pay | Admitting: Family Medicine

## 2022-02-02 ENCOUNTER — Ambulatory Visit (INDEPENDENT_AMBULATORY_CARE_PROVIDER_SITE_OTHER): Payer: Managed Care, Other (non HMO) | Admitting: Family Medicine

## 2022-02-02 VITALS — BP 128/88 | HR 93 | Wt 238.6 lb

## 2022-02-02 DIAGNOSIS — G4709 Other insomnia: Secondary | ICD-10-CM

## 2022-02-02 DIAGNOSIS — I10 Essential (primary) hypertension: Secondary | ICD-10-CM

## 2022-02-02 DIAGNOSIS — G4733 Obstructive sleep apnea (adult) (pediatric): Secondary | ICD-10-CM

## 2022-02-02 DIAGNOSIS — R7303 Prediabetes: Secondary | ICD-10-CM

## 2022-02-02 DIAGNOSIS — E7849 Other hyperlipidemia: Secondary | ICD-10-CM

## 2022-02-02 DIAGNOSIS — L723 Sebaceous cyst: Secondary | ICD-10-CM | POA: Diagnosis not present

## 2022-02-02 MED ORDER — LISINOPRIL-HYDROCHLOROTHIAZIDE 10-12.5 MG PO TABS: 1.0000 | ORAL_TABLET | Freq: Every day | ORAL | 1 refills | Status: DC

## 2022-02-02 MED ORDER — SERTRALINE HCL 50 MG PO TABS: ORAL_TABLET | ORAL | 1 refills | Status: DC

## 2022-02-02 MED ORDER — MELOXICAM 15 MG PO TABS: 15.0000 mg | ORAL_TABLET | Freq: Every day | ORAL | 1 refills | Status: DC

## 2022-02-02 NOTE — Progress Notes (Signed)
   Subjective:    Patient ID: Bryan Gilbert, male    DOB: 25-Apr-1966, 56 y.o.   MRN: [id number]  HPI Pt arrives for follow up on HTN. Pt reports high readings at times. No issues at this time.  Patient does relate his weight is gone up recently.  He states this is related to lack of activity because of the hot weather He states is now become more active watch his portions Also having a hard time falling asleep and staying asleep but he states his CPAP is doing well for him.  He states in the past he used a very low-dose of Seroquel he would like to go back on this he will let us know the size of the medicine  Sinus infection recently that gives him trouble. Pt reports hard time sleeping and fatigue.   Pt was on Seroquel and would like to discuss going back on this [facility] originally prescribed) Sebaceous cyst  Primary hypertension - Plan: Hemoglobin A1c, Lipid panel  Obstructive sleep apnea  Other insomnia  Other hyperlipidemia - Plan: Hemoglobin A1c, Lipid panel  Prediabetes - Plan: Hemoglobin A1c, Lipid panel   Review of Systems     Objective:   Physical Exam General-in no acute distress Eyes-no discharge Lungs-respiratory rate normal, CTA CV-no murmurs,RRR Extremities skin warm dry no edema Neuro grossly normal Behavior normal, alert        Assessment & Plan:   2. Sebaceous cyst On the back of his head freely movable no sign of cancer it is approximately 3 mm in diameter he was given warning signs regarding infection and if it does get larger to have it removed he will let us know  3. Primary hypertension Blood pressure decent control we did discuss bumping up the dose of lisinopril versus HCTZ versus just going to plain lisinopril he request to leave everything as is and follow-up in 6 months - Hemoglobin A1c - Lipid panel  4. Obstructive sleep apnea He uses his machine on a regular basis states it does help him  5. Other insomnia He relates he has a hard time  falling asleep.  Was on Seroquel one fourth of a tablet each evening through [facility] urology ran out of his medicine.Marland Kitchen  He is requesting that we prescribe this.  He will send [place] the information regarding this medicine and the dosage.  6. Other hyperlipidemia Need to check lipid profile.  May or may not need to be on medication depending on risk percentage - Hemoglobin A1c - Lipid panel  7. Prediabetes Has history of prediabetes very important to try to watch portions control and lose weight if possible - Hemoglobin A1c - Lipid panel  Follow-up in 6 months

## 2022-02-06 ENCOUNTER — Telehealth: Payer: Self-pay | Admitting: *Deleted

## 2022-02-06 NOTE — Telephone Encounter (Signed)
Patient called back to state the medication was Seroquel 100 mg and he quartered the tablets

## 2022-02-07 NOTE — Telephone Encounter (Signed)
Patient states he was using low-dose to help him sleep He was using a fourth of a tablet Therefore I recommend Seroquel 25 mg 1 nightly which is equal to a fourth of a tablet.  Please inform the patient.  Recommend 90 tablets 1 each evening with 2 refill

## 2022-02-09 ENCOUNTER — Telehealth: Payer: Self-pay | Admitting: Family Medicine

## 2022-02-09 MED ORDER — QUETIAPINE FUMARATE 25 MG PO TABS: ORAL_TABLET | ORAL | 2 refills | Status: DC

## 2022-02-09 NOTE — Telephone Encounter (Signed)
error 

## 2022-02-09 NOTE — Telephone Encounter (Signed)
Medication sent to pharmacy. Left message to return call 

## 2022-03-03 NOTE — Telephone Encounter (Signed)
Patient picked up script 02/20/22 per pharmacy

## 2022-03-30 ENCOUNTER — Encounter: Payer: Self-pay | Admitting: Family Medicine

## 2022-03-30 DIAGNOSIS — D492 Neoplasm of unspecified behavior of bone, soft tissue, and skin: Secondary | ICD-10-CM

## 2022-03-31 NOTE — Telephone Encounter (Signed)
Nurses Please go ahead with referral to ophthalmology for a growth on the eyelid margin thank you  This does need to be with an ophthalmologist not with a optometrist thank you

## 2022-04-07 ENCOUNTER — Telehealth: Payer: Self-pay

## 2022-04-07 NOTE — Telephone Encounter (Signed)
Will Start appeal for patient on 04/08/2022 for Testosterone 20.25 MG/ACT (1.62%) GEL

## 2022-04-09 ENCOUNTER — Telehealth: Payer: Self-pay

## 2022-04-09 NOTE — Telephone Encounter (Signed)
Patient received denial letter for testosterone gel. He is confused because this is just a continuance of treatment, he has been on this for several years.  Patient is asking what the next steps are.  Informed patient via Mychart we have sent in an appeal and will inform him once we have a response.

## 2022-05-07 LAB — HM DIABETES EYE EXAM

## 2022-05-12 ENCOUNTER — Telehealth: Payer: Self-pay

## 2022-05-12 NOTE — Telephone Encounter (Signed)
Patient called to ask for an update on next steps since his testosterone gel was denied through insurance.  I recently submitted an appeal with clinical note for continuance of rx on 05/11/2022.  Waiting for response from insurance.  I tried to reach back out to patient but there was no answer and no way to leave a voicemail.  Will try to reach back out to patient at a later time, will continue checking status of appeal.

## 2022-05-22 ENCOUNTER — Telehealth: Payer: Self-pay | Admitting: Adult Health

## 2022-05-22 NOTE — Telephone Encounter (Signed)
Noted. Unfortunately because this is treated like a prescription we have to see the patient yearly. We have to make sure their machine is working good and treating the apnea well as well as order supplies.   **If the patient calls back please inform him of the above and schedule a visit.

## 2022-05-22 NOTE — Telephone Encounter (Signed)
Pt is calling. Stated he needs a prescription for a new mask per his insurance. I informed pt he may need a yearly follow- up and he said he just need a new mask and not a follow-up visit. Pt said he just may go buy one and not worry about the other headache.

## 2022-06-05 ENCOUNTER — Ambulatory Visit (INDEPENDENT_AMBULATORY_CARE_PROVIDER_SITE_OTHER): Payer: Managed Care, Other (non HMO) | Admitting: Urology

## 2022-06-05 VITALS — BP 144/96 | HR 108

## 2022-06-05 DIAGNOSIS — N486 Induration penis plastica: Secondary | ICD-10-CM | POA: Diagnosis not present

## 2022-06-05 DIAGNOSIS — E291 Testicular hypofunction: Secondary | ICD-10-CM | POA: Diagnosis not present

## 2022-06-05 MED ORDER — TESTOSTERONE 20.25 MG/ACT (1.62%) TD GEL: 3.0000 | Freq: Every day | TRANSDERMAL | 3 refills | Status: DC

## 2022-06-05 NOTE — Progress Notes (Signed)
06/05/2022 1:07 PM   Bryan Gilbert Jun 29, 1966 [id number]  Referring provider: Kathyrn Drown, MD [address] [place],  [place] [address]  Followup hypogonadism and peyronies disease   HPI: Bryan Gilbert is a 56yo here for followup for hypogonadism and peyronies disease. Patient is not currently on androgel due to insurance denial. He has decreased energy and libido. No recent testosterone labs. The patient continues to have significant dorsal penile curvature which at times is painful with intercourse.    PMH: Past Medical History:  Diagnosis Date   Abdominal migraine    Cervical neuralgia    Cyclical vomiting    Elevated hemoglobin (HCC)    Hypertension    Sleep apnea    states uses O2 and CPAP at night   Slipped intervertebral disc     Surgical History: Past Surgical History:  Procedure Laterality Date   ESOPHAGOGASTRODUODENOSCOPY      Home Medications:  Allergies as of 06/05/2022       Reactions   Cefprozil Diarrhea, Nausea Only   Intestinal upset and diarrhea   Lipitor [atorvastatin] Other (See Comments)   Myalgia   Xtandi [enzalutamide]    GI upset        Medication List        Accurate as of June 05, 2022  1:07 PM. If you have any questions, ask your nurse or doctor.          butorphanol 10 MG/ML nasal spray Commonly known as: STADOL Place 1 spray into the nose every 4 (four) hours as needed for headache or migraine.   cyclobenzaprine 10 MG tablet Commonly known as: FLEXERIL Take 10 mg by mouth 3 (three) times daily as needed for muscle spasms.   HYDROcodone-acetaminophen 10-325 MG tablet Commonly known as: NORCO Take 1 tablet by mouth every 4 (four) hours as needed for moderate pain.   levorphanol 2 MG tablet Commonly known as: LEVODROMORAN   lisinopril-hydrochlorothiazide 10-12.5 MG tablet Commonly known as: ZESTORETIC Take 1 tablet by mouth daily.   loperamide 2 MG capsule Commonly known as: IMODIUM Take 2 mg by mouth as  needed for diarrhea or loose stools.   meloxicam 15 MG tablet Commonly known as: MOBIC Take 1 tablet (15 mg total) by mouth daily.   QUEtiapine 25 MG tablet Commonly known as: SEROquel Take one tablet po each night   SALINE NASAL SPRAY NA Place 1 spray into the nose daily as needed (dryness). Reported on 09/17/2015   sertraline 50 MG tablet Commonly known as: ZOLOFT Take one tablet po every day   simethicone 80 MG chewable tablet Commonly known as: MYLICON Chew 80 mg by mouth every 6 (six) hours as needed for flatulence.   tadalafil 20 MG tablet Commonly known as: CIALIS Take 0.5-1 tablets (10-20 mg total) by mouth every other day as needed for erectile dysfunction.   Testosterone 20.25 MG/ACT (1.62%) Gel Apply 3 Pump topically daily.        Allergies:  Allergies  Allergen Reactions   Cefprozil Diarrhea and Nausea Only    Intestinal upset and diarrhea   Lipitor [Atorvastatin] Other (See Comments)    Myalgia   Xtandi [Enzalutamide]     GI upset    Family History: Family History  Problem Relation Age of Onset   Heart failure Father    Hypertension Father     Social History:  reports that he has never smoked. He has never used smokeless tobacco. He reports that he does not drink  alcohol and does not use drugs.  ROS: All other review of systems were reviewed and are negative except what is noted above in HPI  Physical Exam: BP (!) 144/96   Pulse (!) 108   Constitutional:  Alert and oriented, No acute distress. HEENT: Bryan Gilbert AT, moist mucus membranes.  Trachea midline, no masses. Cardiovascular: No clubbing, cyanosis, or edema. Respiratory: Normal respiratory effort, no increased work of breathing. GI: Abdomen is soft, nontender, nondistended, no abdominal masses GU: No CVA tenderness.  Lymph: No cervical or inguinal lymphadenopathy. Skin: No rashes, bruises or suspicious lesions. Neurologic: Grossly intact, no focal deficits, moving all 4  extremities. Psychiatric: Normal mood and affect.  Laboratory Data: Lab Results  Component Value Date   WBC 8.3 11/18/2021   HGB 18.8 (H) 11/18/2021   HCT 56.8 (H) 11/18/2021   MCV 83 11/18/2021   PLT 369 01/31/2021    Lab Results  Component Value Date   CREATININE 0.97 11/18/2021    No results found for: "PSA"  Lab Results  Component Value Date   TESTOSTERONE 706 11/18/2021    Lab Results  Component Value Date   HGBA1C 6.1 (H) 01/31/2021    Urinalysis    Component Value Date/Time   COLORURINE YELLOW 04/16/2020 1319   APPEARANCEUR HAZY (A) 04/16/2020 1319   LABSPEC 1.021 04/16/2020 1319   PHURINE 7.0 04/16/2020 1319   GLUCOSEU NEGATIVE 04/16/2020 1319   HGBUR NEGATIVE 04/16/2020 1319   BILIRUBINUR NEGATIVE 04/16/2020 1319   KETONESUR 5 (A) 04/16/2020 1319   PROTEINUR 30 (A) 04/16/2020 1319   UROBILINOGEN 0.2 05/19/2013 1732   NITRITE NEGATIVE 04/16/2020 1319   LEUKOCYTESUR NEGATIVE 04/16/2020 1319    Lab Results  Component Value Date   BACTERIA RARE (A) 04/16/2020    Pertinent Imaging:  No results found for this or any previous visit.  No results found for this or any previous visit.  No results found for this or any previous visit.  No results found for this or any previous visit.  No results found for this or any previous visit.  No valid procedures specified. No results found for this or any previous visit.  No results found for this or any previous visit.   Assessment & Plan:    1. Male hypogonadism -restart androgel  2. Peyronies disease We discussed the management of peyronies disease including medical therapy, penile plication, verapamil therapy and xiaflex therapy. After discussed the options the patient elects for observation   No follow-ups on file.  Bryan Bang, MD  [facility] [place]

## 2022-06-08 ENCOUNTER — Encounter: Payer: Self-pay | Admitting: Adult Health

## 2022-06-12 ENCOUNTER — Other Ambulatory Visit: Payer: Managed Care, Other (non HMO)

## 2022-06-16 ENCOUNTER — Encounter: Payer: Self-pay | Admitting: Urology

## 2022-06-16 NOTE — Patient Instructions (Signed)

## 2022-06-22 ENCOUNTER — Encounter: Payer: Self-pay | Admitting: *Deleted

## 2022-06-26 ENCOUNTER — Other Ambulatory Visit: Payer: Managed Care, Other (non HMO)

## 2022-07-28 ENCOUNTER — Ambulatory Visit: Payer: Managed Care, Other (non HMO) | Admitting: Adult Health

## 2022-08-05 ENCOUNTER — Encounter: Payer: Self-pay | Admitting: Family Medicine

## 2022-08-05 ENCOUNTER — Ambulatory Visit: Payer: Managed Care, Other (non HMO) | Admitting: Family Medicine

## 2022-08-05 VITALS — BP 132/88 | Wt 246.8 lb

## 2022-08-05 DIAGNOSIS — R7303 Prediabetes: Secondary | ICD-10-CM

## 2022-08-05 DIAGNOSIS — Z1211 Encounter for screening for malignant neoplasm of colon: Secondary | ICD-10-CM

## 2022-08-05 DIAGNOSIS — G8929 Other chronic pain: Secondary | ICD-10-CM

## 2022-08-05 DIAGNOSIS — F1120 Opioid dependence, uncomplicated: Secondary | ICD-10-CM

## 2022-08-05 DIAGNOSIS — I10 Essential (primary) hypertension: Secondary | ICD-10-CM | POA: Diagnosis not present

## 2022-08-05 DIAGNOSIS — G4733 Obstructive sleep apnea (adult) (pediatric): Secondary | ICD-10-CM

## 2022-08-05 DIAGNOSIS — D45 Polycythemia vera: Secondary | ICD-10-CM | POA: Insufficient documentation

## 2022-08-05 DIAGNOSIS — E7849 Other hyperlipidemia: Secondary | ICD-10-CM | POA: Diagnosis not present

## 2022-08-05 DIAGNOSIS — G4701 Insomnia due to medical condition: Secondary | ICD-10-CM

## 2022-08-05 MED ORDER — MELOXICAM 15 MG PO TABS: 15.0000 mg | ORAL_TABLET | Freq: Every day | ORAL | 1 refills | Status: DC

## 2022-08-05 MED ORDER — LISINOPRIL-HYDROCHLOROTHIAZIDE 10-12.5 MG PO TABS
1.0000 | ORAL_TABLET | Freq: Every day | ORAL | 1 refills | Status: DC
Start: 2022-08-05 — End: 2023-02-03

## 2022-08-05 MED ORDER — SERTRALINE HCL 50 MG PO TABS: ORAL_TABLET | ORAL | 1 refills | Status: DC

## 2022-08-05 MED ORDER — QUETIAPINE FUMARATE 25 MG PO TABS: ORAL_TABLET | ORAL | 2 refills | Status: DC

## 2022-08-05 NOTE — Progress Notes (Signed)
   Subjective:    Patient ID: Bryan Gilbert, male    DOB: Nov 30, 1965, 57 y.o.   MRN: [id number]  HPI Pt arrives for blood pressure follow up. Pt states no issues with blood pressure.  Prediabetes - Plan: Hemoglobin A1c  Other hyperlipidemia - Plan: Lipid panel  Primary hypertension - Plan: Microalbumin/Creatinine Ratio, Urine  Colon cancer screening  Obstructive sleep apnea  Insomnia secondary to chronic pain  Polycythemia vera (HCC)  Narcotic dependency, continuous (Bryan Gilbert), Chronic We did discuss his health at length He is taking his medicines regular basis Tries to watch his diet Works a lot of hours under a fair amount of stress unable to do much in the way of exercising Denies any setbacks with his moods. Does have chronic neck pain Is on chronic opioids for his pain Also uses sleep apnea machine Recent abs reviewed Hemoglobin elevated probably related to testosterone He states urology is managing this with possibility of doing phlebotomy  Review of Systems     Objective:   Physical Exam  General-in no acute distress Eyes-no discharge Lungs-respiratory rate normal, CTA CV-no murmurs,RRR Extremities skin warm dry no edema Neuro grossly normal Behavior normal, alert       Assessment & Plan:  1. Prediabetes Try to fit in regular exercise try to fit in walking watch portions minimize starches glucose been elevated in the past check A1c - Hemoglobin A1c  2. Other hyperlipidemia Hyperlipidemia check lipid profile - Lipid panel  3. Primary hypertension Blood pressure recent lab work looked good but needs urine ACR continue medication blood pressure reasonable states readings are better at home - Microalbumin/Creatinine Ratio, Urine  4. Colon cancer screening Patient has family history-his dad had colon cancer-but the patient also has cyclic vomiting syndrome and he is concerned that drinking a prep would set off cyclical vomiting syndrome therefore we may need  to check stool for blood as a way of screening this is a imperfect solution for a difficult situation will touch base with gastroenterology to get their input as well  I did send a message to gastroenterology regarding this issue for their informal input  5. Obstructive sleep apnea Continue CPAP machine he states that is helping  6. Insomnia secondary to chronic pain He uses Seroquel at nighttime to help him rest he states without it he cannot sleep well I did tell him that this could increase his weight and increase glucose healthy eating recommended Patient is not a candidate for nerve pills or Ambien with his pain medicine  Should be noted that he is on pain medicine necessary to help control his chronic pain he is under the care of a specialist for this related to his neck and back He also has low testosterone on treatments but hemoglobin does go up he gets this treated through urology.  Secondary polycythemia Follow-up 6 months

## 2022-08-23 ENCOUNTER — Encounter: Payer: Self-pay | Admitting: Family Medicine

## 2022-09-23 ENCOUNTER — Telehealth: Payer: Self-pay

## 2022-09-23 ENCOUNTER — Other Ambulatory Visit: Payer: Self-pay

## 2022-09-23 DIAGNOSIS — E291 Testicular hypofunction: Secondary | ICD-10-CM

## 2022-09-23 NOTE — Telephone Encounter (Signed)
Patient called to have [facility] labs mailed to him. Patient is aware that [facility] lab work has been sent out in the mail. Patient voiced understanding

## 2022-10-14 ENCOUNTER — Other Ambulatory Visit: Payer: Self-pay | Admitting: Urology

## 2022-11-17 ENCOUNTER — Other Ambulatory Visit: Payer: Self-pay | Admitting: Family Medicine

## 2022-12-11 ENCOUNTER — Ambulatory Visit: Payer: Managed Care, Other (non HMO) | Admitting: Urology

## 2022-12-18 ENCOUNTER — Ambulatory Visit: Payer: Managed Care, Other (non HMO) | Admitting: Urology

## 2022-12-18 VITALS — BP 151/101 | HR 97

## 2022-12-18 DIAGNOSIS — E291 Testicular hypofunction: Secondary | ICD-10-CM | POA: Diagnosis not present

## 2022-12-18 DIAGNOSIS — N486 Induration penis plastica: Secondary | ICD-10-CM

## 2022-12-18 LAB — URINALYSIS, ROUTINE W REFLEX MICROSCOPIC
Bilirubin, UA: NEGATIVE
Leukocytes,UA: NEGATIVE
Nitrite, UA: NEGATIVE
Protein,UA: NEGATIVE
RBC, UA: NEGATIVE
Specific Gravity, UA: 1.03 (ref 1.005–1.030)
Urobilinogen, Ur: 1 mg/dL (ref 0.2–1.0)
pH, UA: 5.5 (ref 5.0–7.5)

## 2022-12-18 NOTE — Progress Notes (Signed)
12/18/2022 1:13 PM   Bryan Gilbert 02/17/1966 [id number]  Referring provider: Babs Sciara, MD [address] [place],  [place] [address]  Followup hypogonadism   HPI: Bryan Gilbert is a 57yo here for followup for hypogonadism and peyronies disease. He notes no change in curvature since last visit. No recent testosterone labs labs. He has not taken his androgel in 10 days. He has worsening fatigue. No worsening LUTS   PMH: Past Medical History:  Diagnosis Date   Abdominal migraine    Cervical neuralgia    Cyclical vomiting    Elevated hemoglobin (HCC)    Hypertension    Sleep apnea    states uses O2 and CPAP at night   Slipped intervertebral disc     Surgical History: Past Surgical History:  Procedure Laterality Date   ESOPHAGOGASTRODUODENOSCOPY      Home Medications:  Allergies as of 12/18/2022       Reactions   Cefprozil Diarrhea, Nausea Only   Intestinal upset and diarrhea   Lipitor [atorvastatin] Other (See Comments)   Myalgia   Xtandi [enzalutamide]    GI upset        Medication List        Accurate as of December 18, 2022  1:13 PM. If you have any questions, ask your nurse or doctor.          butorphanol 10 MG/ML nasal spray Commonly known as: STADOL Place 1 spray into the nose every 4 (four) hours as needed for headache or migraine.   cyclobenzaprine 10 MG tablet Commonly known as: FLEXERIL Take 10 mg by mouth 3 (three) times daily as needed for muscle spasms.   HYDROcodone-acetaminophen 10-325 MG tablet Commonly known as: NORCO Take 1 tablet by mouth every 4 (four) hours as needed for moderate pain.   levorphanol 2 MG tablet Commonly known as: LEVODROMORAN   lisinopril-hydrochlorothiazide 10-12.5 MG tablet Commonly known as: ZESTORETIC Take 1 tablet by mouth daily.   loperamide 2 MG capsule Commonly known as: IMODIUM Take 2 mg by mouth as needed for diarrhea or loose stools.   meloxicam 15 MG tablet Commonly known as:  MOBIC Take 1 tablet (15 mg total) by mouth daily.   QUEtiapine 25 MG tablet Commonly known as: SEROquel Take one tablet po each night   SALINE NASAL SPRAY NA Place 1 spray into the nose daily as needed (dryness). Reported on 09/17/2015   sertraline 50 MG tablet Commonly known as: ZOLOFT TAKE 1 TABLET BY MOUTH EVERY DAY   simethicone 80 MG chewable tablet Commonly known as: MYLICON Chew 80 mg by mouth every 6 (six) hours as needed for flatulence.   tadalafil 20 MG tablet Commonly known as: CIALIS Take 0.5-1 tablets (10-20 mg total) by mouth every other day as needed for erectile dysfunction.   Testosterone 20.25 MG/ACT (1.62%) Gel APPLY 3 PUMPS TOPICALLY DAILY        Allergies:  Allergies  Allergen Reactions   Cefprozil Diarrhea and Nausea Only    Intestinal upset and diarrhea   Lipitor [Atorvastatin] Other (See Comments)    Myalgia   Xtandi [Enzalutamide]     GI upset    Family History: Family History  Problem Relation Age of Onset   Heart failure Father    Hypertension Father     Social History:  reports that he has never smoked. He has never used smokeless tobacco. He reports that he does not drink alcohol and does not use drugs.  ROS: All  other review of systems were reviewed and are negative except what is noted above in HPI  Physical Exam: BP (!) 151/101   Pulse 97   Constitutional:  Alert and oriented, No acute distress. HEENT: [place] AT, moist mucus membranes.  Trachea midline, no masses. Cardiovascular: No clubbing, cyanosis, or edema. Respiratory: Normal respiratory effort, no increased work of breathing. GI: Abdomen is soft, nontender, nondistended, no abdominal masses GU: No CVA tenderness.  Lymph: No cervical or inguinal lymphadenopathy. Skin: No rashes, bruises or suspicious lesions. Neurologic: Grossly intact, no focal deficits, moving all 4 extremities. Psychiatric: Normal mood and affect.  Laboratory Data: Lab Results  Component Value  Date   WBC 8.3 11/18/2021   HGB 18.8 (H) 11/18/2021   HCT 56.8 (H) 11/18/2021   MCV 83 11/18/2021   PLT 369 01/31/2021    Lab Results  Component Value Date   CREATININE 0.97 11/18/2021    No results found for: "PSA"  Lab Results  Component Value Date   TESTOSTERONE 706 11/18/2021    Lab Results  Component Value Date   HGBA1C 6.1 (H) 01/31/2021    Urinalysis    Component Value Date/Time   COLORURINE YELLOW 04/16/2020 1319   APPEARANCEUR HAZY (A) 04/16/2020 1319   LABSPEC 1.021 04/16/2020 1319   PHURINE 7.0 04/16/2020 1319   GLUCOSEU NEGATIVE 04/16/2020 1319   HGBUR NEGATIVE 04/16/2020 1319   BILIRUBINUR NEGATIVE 04/16/2020 1319   KETONESUR 5 (A) 04/16/2020 1319   PROTEINUR 30 (A) 04/16/2020 1319   UROBILINOGEN 0.2 05/19/2013 1732   NITRITE NEGATIVE 04/16/2020 1319   LEUKOCYTESUR NEGATIVE 04/16/2020 1319    Lab Results  Component Value Date   BACTERIA RARE (A) 04/16/2020    Pertinent Imaging:  No results found for this or any previous visit.  No results found for this or any previous visit.  No results found for this or any previous visit.  No results found for this or any previous visit.  No results found for this or any previous visit.  No valid procedures specified. No results found for this or any previous visit.  No results found for this or any previous visit.   Assessment & Plan:    1. Male hypogonadism -testosterone labs today -continue androgel -followup 6 months with labs - Urinalysis, Routine w reflex microscopic  2. Peyronie's disease -patient defers therapy at this time   No follow-ups on file.  Wilkie Aye, MD  [facility] [place]

## 2022-12-23 LAB — CBC
Hematocrit: 60.8 % — ABNORMAL HIGH (ref 37.5–51.0)
Hemoglobin: 20.3 g/dL (ref 13.0–17.7)
MCH: 29.2 pg (ref 26.6–33.0)
MCHC: 33.4 g/dL (ref 31.5–35.7)
MCV: 88 fL (ref 79–97)
Platelets: 295 10*3/uL (ref 150–450)
RBC: 6.95 x10E6/uL — ABNORMAL HIGH (ref 4.14–5.80)
RDW: 13.1 % (ref 11.6–15.4)
WBC: 9.1 10*3/uL (ref 3.4–10.8)

## 2022-12-23 LAB — COMPREHENSIVE METABOLIC PANEL
ALT: 61 IU/L — ABNORMAL HIGH (ref 0–44)
AST: 37 IU/L (ref 0–40)
Albumin/Globulin Ratio: 1.4
Albumin: 4.3 g/dL (ref 3.8–4.9)
Alkaline Phosphatase: 105 IU/L (ref 44–121)
BUN/Creatinine Ratio: 17 (ref 9–20)
BUN: 16 mg/dL (ref 6–24)
Bilirubin Total: 0.5 mg/dL (ref 0.0–1.2)
CO2: 26 mmol/L (ref 20–29)
Calcium: 10 mg/dL (ref 8.7–10.2)
Chloride: 99 mmol/L (ref 96–106)
Creatinine, Ser: 0.92 mg/dL (ref 0.76–1.27)
Globulin, Total: 3 g/dL (ref 1.5–4.5)
Glucose: 175 mg/dL — ABNORMAL HIGH (ref 70–99)
Potassium: 4.7 mmol/L (ref 3.5–5.2)
Sodium: 140 mmol/L (ref 134–144)
Total Protein: 7.3 g/dL (ref 6.0–8.5)
eGFR: 98 mL/min/{1.73_m2} (ref >59)

## 2022-12-23 LAB — TESTOSTERONE,FREE AND TOTAL
Testosterone, Free: 2.4 pg/mL — ABNORMAL LOW (ref 7.2–24.0)
Testosterone: 109 ng/dL — ABNORMAL LOW (ref 264–916)

## 2022-12-24 ENCOUNTER — Telehealth: Payer: Self-pay

## 2022-12-24 NOTE — Telephone Encounter (Signed)
-----   Message from Malen Gauze, MD sent at 12/24/2022  9:28 AM EDT ----- Hgb is very high. He needs to give blood ----- Message ----- From: Grier Rocher, CMA Sent: 12/23/2022   3:42 PM EDT To: Malen Gauze, MD  Please review.

## 2022-12-24 NOTE — Telephone Encounter (Signed)
Left vm requesting pt call back for results

## 2022-12-25 ENCOUNTER — Encounter: Payer: Self-pay | Admitting: Urology

## 2022-12-25 NOTE — Telephone Encounter (Signed)
Unable to reach patient after multiple attempts.  DPR singed to leave detailed msg on vm.  Detailed message left with MD recommendations and requested a call back Monday morning to confirm he received results.  Letter also sent informing pt.

## 2022-12-25 NOTE — Patient Instructions (Signed)

## 2023-01-31 ENCOUNTER — Other Ambulatory Visit: Payer: Self-pay | Admitting: Urology

## 2023-02-03 ENCOUNTER — Ambulatory Visit: Payer: Managed Care, Other (non HMO) | Admitting: Family Medicine

## 2023-02-03 ENCOUNTER — Encounter: Payer: Self-pay | Admitting: Family Medicine

## 2023-02-03 VITALS — BP 120/78 | HR 79 | Temp 98.1°F | Ht 67.0 in | Wt 243.0 lb

## 2023-02-03 DIAGNOSIS — Z1211 Encounter for screening for malignant neoplasm of colon: Secondary | ICD-10-CM

## 2023-02-03 DIAGNOSIS — D45 Polycythemia vera: Secondary | ICD-10-CM

## 2023-02-03 DIAGNOSIS — R7303 Prediabetes: Secondary | ICD-10-CM | POA: Diagnosis not present

## 2023-02-03 DIAGNOSIS — Z125 Encounter for screening for malignant neoplasm of prostate: Secondary | ICD-10-CM

## 2023-02-03 DIAGNOSIS — I10 Essential (primary) hypertension: Secondary | ICD-10-CM

## 2023-02-03 DIAGNOSIS — E7849 Other hyperlipidemia: Secondary | ICD-10-CM | POA: Diagnosis not present

## 2023-02-03 MED ORDER — SERTRALINE HCL 50 MG PO TABS: ORAL_TABLET | ORAL | 1 refills | Status: DC

## 2023-02-03 MED ORDER — LISINOPRIL-HYDROCHLOROTHIAZIDE 10-12.5 MG PO TABS: 1.0000 | ORAL_TABLET | Freq: Every day | ORAL | 1 refills | Status: DC

## 2023-02-03 MED ORDER — MELOXICAM 15 MG PO TABS: 15.0000 mg | ORAL_TABLET | Freq: Every day | ORAL | 1 refills | Status: DC

## 2023-02-03 MED ORDER — TADALAFIL 20 MG PO TABS: 10.0000 mg | ORAL_TABLET | ORAL | 11 refills | Status: DC | PRN

## 2023-02-03 NOTE — Progress Notes (Signed)
Subjective:    Patient ID: Bryan Gilbert, male    DOB: 07/02/1966, 57 y.o.   MRN: [id number]  HPI Pt comes in today for a 6 month follow up.  PT states he hurt his right ankle back in April, it initially swelled and bruised. Bruising has went away but is still slightly swollen compared to other ankle with minor pain. Other hyperlipidemia - Plan: Lipid Panel  Primary hypertension - Plan: Microalbumin/Creatinine Ratio, Urine  Prediabetes - Plan: Hemoglobin A1c  Polycythemia vera (HCC)  Screening PSA (prostate specific antigen) - Plan: PSA  Colon cancer screening - Plan: Cologuard  Outpatient Encounter Medications as of 02/03/2023  Medication Sig   butorphanol (STADOL) 10 MG/ML nasal spray Place 1 spray into the nose every 4 (four) hours as needed for headache or migraine.    cyclobenzaprine (FLEXERIL) 10 MG tablet Take 10 mg by mouth 3 (three) times daily as needed for muscle spasms.    HYDROcodone-acetaminophen (NORCO) 10-325 MG tablet Take 1 tablet by mouth every 4 (four) hours as needed for moderate pain.    levorphanol (LEVODROMORAN) 2 MG tablet    lisinopril-hydrochlorothiazide (ZESTORETIC) 10-12.5 MG tablet Take 1 tablet by mouth daily.   loperamide (IMODIUM) 2 MG capsule Take 2 mg by mouth as needed for diarrhea or loose stools.   meloxicam (MOBIC) 15 MG tablet Take 1 tablet (15 mg total) by mouth daily.   SALINE NASAL SPRAY NA Place 1 spray into the nose daily as needed (dryness). Reported on 09/17/2015   sertraline (ZOLOFT) 50 MG tablet TAKE 1 TABLET BY MOUTH EVERY DAY   simethicone (MYLICON) 80 MG chewable tablet Chew 80 mg by mouth every 6 (six) hours as needed for flatulence.   tadalafil (CIALIS) 20 MG tablet Take 0.5-1 tablets (10-20 mg total) by mouth every other day as needed for erectile dysfunction.   Testosterone 20.25 MG/ACT (1.62%) GEL APPLY 3 PUMPS TOPICALLY DAILY   [DISCONTINUED] lisinopril-hydrochlorothiazide (ZESTORETIC) 10-12.5 MG tablet Take 1 tablet by mouth  daily.   [DISCONTINUED] meloxicam (MOBIC) 15 MG tablet Take 1 tablet (15 mg total) by mouth daily.   [DISCONTINUED] QUEtiapine (SEROQUEL) 25 MG tablet Take one tablet po each night   [DISCONTINUED] sertraline (ZOLOFT) 50 MG tablet TAKE 1 TABLET BY MOUTH EVERY DAY   [DISCONTINUED] tadalafil (CIALIS) 20 MG tablet Take 0.5-1 tablets (10-20 mg total) by mouth every other day as needed for erectile dysfunction.   No facility-administered encounter medications on file as of 02/03/2023.   Results for orders placed or performed in visit on 12/18/22  Urinalysis, Routine w reflex microscopic  Result Value Ref Range   Specific Gravity, UA 1.030 1.005 - 1.030   pH, UA 5.5 5.0 - 7.5   Color, UA Yellow Yellow   Appearance Ur Clear Clear   Leukocytes,UA Negative Negative   Protein,UA Negative Negative/Trace   Glucose, UA 3+ (A) Negative   Ketones, UA Trace (A) Negative   RBC, UA Negative Negative   Bilirubin, UA Negative Negative   Urobilinogen, Ur 1.0 0.2 - 1.0 mg/dL   Nitrite, UA Negative Negative   Microscopic Examination Comment   CBC  Result Value Ref Range   WBC 9.1 3.4 - 10.8 x10E3/uL   RBC 6.95 (H) 4.14 - 5.80 x10E6/uL   Hemoglobin 20.3 (HH) 13.0 - 17.7 g/dL   Hematocrit 57.8 (H) 46.9 - 51.0 %   MCV 88 79 - 97 fL   MCH 29.2 26.6 - 33.0 pg   MCHC 33.4 31.5 - 35.7  g/dL   RDW 95.2 84.1 - 32.4 %   Platelets 295 150 - 450 x10E3/uL  Comprehensive metabolic panel  Result Value Ref Range   Glucose 175 (H) 70 - 99 mg/dL   BUN 16 6 - 24 mg/dL   Creatinine, Ser 4.01 0.76 - 1.27 mg/dL   eGFR 98 >02 VO/ZDG/6.44   BUN/Creatinine Ratio 17 9 - 20   Sodium 140 134 - 144 mmol/L   Potassium 4.7 3.5 - 5.2 mmol/L   Chloride 99 96 - 106 mmol/L   CO2 26 20 - 29 mmol/L   Calcium 10.0 8.7 - 10.2 mg/dL   Total Protein 7.3 6.0 - 8.5 g/dL   Albumin 4.3 3.8 - 4.9 g/dL   Globulin, Total 3.0 1.5 - 4.5 g/dL   Albumin/Globulin Ratio 1.4    Bilirubin Total 0.5 0.0 - 1.2 mg/dL   Alkaline Phosphatase 105 44  - 121 IU/L   AST 37 0 - 40 IU/L   ALT 61 (H) 0 - 44 IU/L  Testosterone,Free and Total  Result Value Ref Range   Testosterone 109 (L) 264 - 916 ng/dL   Testosterone, Free 2.4 (L) 7.2 - 24.0 pg/mL     Review of Systems     Objective:   Physical Exam  General-in no acute distress Eyes-no discharge Lungs-respiratory rate normal, CTA CV-no murmurs,RRR Extremities skin warm dry no edema Neuro grossly normal Behavior normal, alert  Ankle is a little bit enlarged compared to the left ligaments are stable no tenderness on exam      Assessment & Plan:  1. Other hyperlipidemia Healthy diet regular activity check lipid profile Depending on the result and risk factors consider possibility of coronary calcium test - Lipid Panel  2. Primary hypertension Continue blood pressure medicine healthy diet check urine ACR - Microalbumin/Creatinine Ratio, Urine  3. Prediabetes Minimize starches in diet check A1c - Hemoglobin A1c  4. Polycythemia vera (HCC) More than likely related to his testosterone encouraged him to work with his urologist to adjust dosing He can certainly donate blood as well on a regular basis  5. Screening PSA (prostate specific antigen) Screening PSA - PSA  6. Colon cancer screening Previously patient states could not tolerate colonoscopy because of cyclical vomiting syndrome recommend Cologuard-she does have a family history of this issue but I did touch base with gastroenterology who felt that Cologuard testing would be better than doing no testing obviously if positive would need colonoscopy  As for ankle sprain he should gradually get better stretching exercises shown strengthening exercises shown - Cologuard

## 2023-04-18 ENCOUNTER — Other Ambulatory Visit: Payer: Self-pay

## 2023-04-18 ENCOUNTER — Encounter (HOSPITAL_COMMUNITY): Payer: Self-pay

## 2023-04-18 ENCOUNTER — Emergency Department (HOSPITAL_COMMUNITY): Payer: Managed Care, Other (non HMO)

## 2023-04-18 ENCOUNTER — Emergency Department (HOSPITAL_COMMUNITY)
Admission: EM | Admit: 2023-04-18 | Discharge: 2023-04-19 | Disposition: A | Discharge status: Home / Self Care | Payer: Managed Care, Other (non HMO) | Attending: Emergency Medicine | Admitting: Emergency Medicine

## 2023-04-18 DIAGNOSIS — N200 Calculus of kidney: Secondary | ICD-10-CM | POA: Diagnosis not present

## 2023-04-18 DIAGNOSIS — Z79899 Other long term (current) drug therapy: Secondary | ICD-10-CM | POA: Diagnosis not present

## 2023-04-18 DIAGNOSIS — R739 Hyperglycemia, unspecified: Secondary | ICD-10-CM | POA: Insufficient documentation

## 2023-04-18 DIAGNOSIS — R030 Elevated blood-pressure reading, without diagnosis of hypertension: Secondary | ICD-10-CM

## 2023-04-18 DIAGNOSIS — N201 Calculus of ureter: Secondary | ICD-10-CM

## 2023-04-18 DIAGNOSIS — E876 Hypokalemia: Secondary | ICD-10-CM | POA: Diagnosis not present

## 2023-04-18 DIAGNOSIS — R109 Unspecified abdominal pain: Secondary | ICD-10-CM

## 2023-04-18 DIAGNOSIS — I10 Essential (primary) hypertension: Secondary | ICD-10-CM

## 2023-04-18 LAB — COMPREHENSIVE METABOLIC PANEL
ALT: 73 U/L — ABNORMAL HIGH (ref 0–44)
AST: 43 U/L — ABNORMAL HIGH (ref 15–41)
Albumin: 4.4 g/dL (ref 3.5–5.0)
Alkaline Phosphatase: 81 U/L (ref 38–126)
Anion gap: 15 (ref 5–15)
BUN: 21 mg/dL — ABNORMAL HIGH (ref 6–20)
CO2: 21 mmol/L — ABNORMAL LOW (ref 22–32)
Calcium: 9.2 mg/dL (ref 8.9–10.3)
Chloride: 101 mmol/L (ref 98–111)
Creatinine, Ser: 1.08 mg/dL (ref 0.61–1.24)
GFR, Estimated: >60 mL/min (ref >60)
Glucose, Bld: 227 mg/dL — ABNORMAL HIGH (ref 70–99)
Potassium: 3.2 mmol/L — ABNORMAL LOW (ref 3.5–5.1)
Sodium: 137 mmol/L (ref 135–145)
Total Bilirubin: 1 mg/dL (ref 0.3–1.2)
Total Protein: 7.7 g/dL (ref 6.5–8.1)

## 2023-04-18 LAB — URINALYSIS, ROUTINE W REFLEX MICROSCOPIC
Bilirubin Urine: NEGATIVE
Glucose, UA: 150 mg/dL — AB
Ketones, ur: 80 mg/dL — AB
Leukocytes,Ua: NEGATIVE
Nitrite: NEGATIVE
Protein, ur: NEGATIVE mg/dL
Specific Gravity, Urine: 1.023 (ref 1.005–1.030)
pH: 5 (ref 5.0–8.0)

## 2023-04-18 LAB — CBC
HCT: 48.4 % (ref 39.0–52.0)
Hemoglobin: 16.8 g/dL (ref 13.0–17.0)
MCH: 29.6 pg (ref 26.0–34.0)
MCHC: 34.7 g/dL (ref 30.0–36.0)
MCV: 85.4 fL (ref 80.0–100.0)
Platelets: 314 10*3/uL (ref 150–400)
RBC: 5.67 MIL/uL (ref 4.22–5.81)
RDW: 12.3 % (ref 11.5–15.5)
WBC: 14.8 10*3/uL — ABNORMAL HIGH (ref 4.0–10.5)
nRBC: 0 % (ref 0.0–0.2)

## 2023-04-18 LAB — LIPASE, BLOOD: Lipase: 17 U/L (ref 11–51)

## 2023-04-18 LAB — CBG MONITORING, ED: Glucose-Capillary: 227 mg/dL — ABNORMAL HIGH (ref 70–99)

## 2023-04-18 MED ORDER — ONDANSETRON HCL 4 MG/2ML IJ SOLN
4.0000 mg | Freq: Once | INTRAMUSCULAR | Status: AC
  Administered 2023-04-18: 4 mg via INTRAVENOUS
  Filled 2023-04-18: qty 2

## 2023-04-18 MED ORDER — KETOROLAC TROMETHAMINE 15 MG/ML IJ SOLN
15.0000 mg | Freq: Once | INTRAMUSCULAR | Status: AC
  Administered 2023-04-18: 15 mg via INTRAVENOUS
  Filled 2023-04-18: qty 1

## 2023-04-18 MED ORDER — HYDROMORPHONE HCL 1 MG/ML IJ SOLN
1.0000 mg | Freq: Once | INTRAMUSCULAR | Status: AC
  Administered 2023-04-19: 1 mg via INTRAVENOUS
  Filled 2023-04-18: qty 1

## 2023-04-18 MED ORDER — POTASSIUM CHLORIDE CRYS ER 20 MEQ PO TBCR: EXTENDED_RELEASE_TABLET | ORAL | 0 refills | Status: DC

## 2023-04-18 MED ORDER — HYDROMORPHONE HCL 1 MG/ML IJ SOLN
1.0000 mg | Freq: Once | INTRAMUSCULAR | Status: AC
  Administered 2023-04-18: 1 mg via INTRAVENOUS
  Filled 2023-04-18: qty 1

## 2023-04-18 MED ORDER — ONDANSETRON 8 MG PO TBDP: 8.0000 mg | ORAL_TABLET | Freq: Three times a day (TID) | ORAL | 0 refills | Status: DC | PRN

## 2023-04-18 MED ORDER — POTASSIUM CHLORIDE CRYS ER 20 MEQ PO TBCR
40.0000 meq | EXTENDED_RELEASE_TABLET | Freq: Once | ORAL | Status: DC
  Filled 2023-04-18: qty 2

## 2023-04-18 NOTE — ED Triage Notes (Signed)
ABD pain x1 hour RIGHT side Nausea and vomiting x1 hour Pt very aggressive in triage and not cooperative. Pt stated "it feels like I am going to shit myself" "It fucking hurts"

## 2023-04-18 NOTE — ED Notes (Signed)
Pt pale in triage BGL checked

## 2023-04-18 NOTE — ED Notes (Signed)
Pt non- compliant in triage.  Pt cussing at staff as we try to complete triage assessment and vitals Pt not placed on monitor due to non compliance RN at bedside

## 2023-04-18 NOTE — Discharge Instructions (Addendum)
It was our pleasure to provide your ER care today - we hope that you feel better.  Your CT scan shows a 2 mm kidney stone in the distal right ureter.   Drink plenty of fluids/stay well hydrated. Strain urine. Take motrin or aleve as need for pain. You may also take your pain medication as need. No driving for the next 6 hours or when taking narcotic-type pain meds. Take zofran as need for nausea.   From today's labs, your blood sugar is high, and potassium level low  - for potassium, make sure to eat plenty of fruits and vegetables, take potassium supplement as prescribed, and follow up with your doctor in 1-2 weeks.   Follow up with urologist in the coming week if symptoms fail to improve/resolve - call office to arrange appointment.  Return to ER if worse, new symptoms, fevers, severe/intractable pain, persistent vomiting, or other concern.   You were given pain medication in the ER - no driving for the next 6 hours.

## 2023-04-18 NOTE — ED Provider Notes (Signed)
[facility] Provider Note   CSN: [id number] Arrival date & time: 04/18/23  2038     History  Chief Complaint  Patient presents with   Abdominal Pain    Dink Creps [place] is a 57 y.o. male.  Pt with c/o right flank pain. Symptoms acute onset at rest this evening. Denies hx same pain. Hx abdominal migraines but indicates this is different. Denies hx kidney stones. No midline back pain. No radicular pain or leg pain. No saddle area or leg numbness. No weakness. No scrotal or testicular pain or swelling. No hematuria or dysuria. No fever or chills. Nausea, w emesis, not bloody or bilious.   The history is provided by the patient, medical records and a relative.  Abdominal Pain Associated symptoms: nausea and vomiting   Associated symptoms: no chest pain, no constipation, no cough, no diarrhea, no dysuria, no fever, no hematuria and no shortness of breath        Home Medications Prior to Admission medications   Medication Sig Start Date End Date Taking? Authorizing Provider  butorphanol (STADOL) 10 MG/ML nasal spray Place 1 spray into the nose every 4 (four) hours as needed for headache or migraine.  07/28/17   [provider]  cyclobenzaprine (FLEXERIL) 10 MG tablet Take 10 mg by mouth 3 (three) times daily as needed for muscle spasms.     [provider]  HYDROcodone-acetaminophen (NORCO) 10-325 MG tablet Take 1 tablet by mouth every 4 (four) hours as needed for moderate pain.  09/17/15   [provider]  levorphanol Pia Mau) 2 MG tablet  06/30/20   [provider]  lisinopril-hydrochlorothiazide (ZESTORETIC) 10-12.5 MG tablet Take 1 tablet by mouth daily. 02/03/23   Babs Sciara, MD  loperamide (IMODIUM) 2 MG capsule Take 2 mg by mouth as needed for diarrhea or loose stools.    [provider]  meloxicam (MOBIC) 15 MG tablet Take 1 tablet (15 mg total) by mouth daily. 02/03/23   Babs Sciara, MD  SALINE NASAL SPRAY NA Place 1 spray into the nose daily as needed (dryness). Reported on 09/17/2015    [provider]  sertraline (ZOLOFT) 50 MG tablet TAKE 1 TABLET BY MOUTH EVERY DAY 02/03/23   Babs Sciara, MD  simethicone (MYLICON) 80 MG chewable tablet Chew 80 mg by mouth every 6 (six) hours as needed for flatulence.    [provider]  tadalafil (CIALIS) 20 MG tablet Take 0.5-1 tablets (10-20 mg total) by mouth every other day as needed for erectile dysfunction. 02/03/23   Babs Sciara, MD  Testosterone 20.25 MG/ACT (1.62%) GEL APPLY 3 PUMPS TOPICALLY DAILY 02/02/23   McKenzie, Mardene Celeste, MD      Allergies    Cefprozil, Lipitor [atorvastatin], and Xtandi [enzalutamide]    Review of Systems   Review of Systems  Constitutional:  Negative for fever.  Respiratory:  Negative for cough and shortness of breath.   Cardiovascular:  Negative for chest pain.  Gastrointestinal:  Positive for abdominal pain, nausea and vomiting. Negative for constipation and diarrhea.  Genitourinary:  Positive for flank pain. Negative for dysuria, hematuria, scrotal swelling and testicular pain.  Skin:  Negative for rash.  Neurological:  Negative for weakness and numbness.    Physical Exam Updated Vital Signs BP 137/77 (BP Location: Right Arm)   Pulse 89   Temp 97.9 F (36.6 C) (Oral)   Resp 19   Ht 1.702 m (5\' 7" )  Wt 104.3 kg   SpO2 97%   BMI 36.02 kg/m  Physical Exam Vitals and nursing note reviewed.  Constitutional:      Appearance: Normal appearance. He is well-developed.  HENT:     Head: Atraumatic.     Nose: Nose normal.     Mouth/Throat:     Mouth: Mucous membranes are moist.  Eyes:     General: No scleral icterus.    Conjunctiva/sclera: Conjunctivae normal.  Neck:     Trachea: No tracheal deviation.  Cardiovascular:     Rate and Rhythm: Normal rate and regular rhythm.     Pulses: Normal pulses.     Heart sounds: Normal heart sounds. No murmur heard.     No friction rub. No gallop.  Pulmonary:     Effort: Pulmonary effort is normal. No accessory muscle usage or respiratory distress.     Breath sounds: Normal breath sounds.  Abdominal:     General: Bowel sounds are normal. There is no distension.     Palpations: Abdomen is soft. There is no mass.     Tenderness: There is no abdominal tenderness. There is no guarding.  Genitourinary:    Comments: No cva tenderness. No scrotal or testicular pain, swelling or tenderness.  Musculoskeletal:        General: No swelling or tenderness.     Cervical back: Neck supple.     Comments: T/L/S spine non tender, ali  Skin:    General: Skin is warm and dry.     Findings: No rash.     Comments: No skin lesions or rash in area of pain.   Neurological:     Mental Status: He is alert.     Comments: Alert, speech clear. Motor/sens grossly intact bil.   Psychiatric:        Mood and Affect: Mood normal.     ED Results / Procedures / Treatments   Labs (all labs ordered are listed, but only abnormal results are displayed) Results for orders placed or performed during the hospital encounter of 04/18/23  Lipase, blood  Result Value Ref Range   Lipase 17 11 - 51 U/L  Comprehensive metabolic panel  Result Value Ref Range   Sodium 137 135 - 145 mmol/L   Potassium 3.2 (L) 3.5 - 5.1 mmol/L   Chloride 101 98 - 111 mmol/L   CO2 21 (L) 22 - 32 mmol/L   Glucose, Bld 227 (H) 70 - 99 mg/dL   BUN 21 (H) 6 - 20 mg/dL   Creatinine, Ser 6.57 0.61 - 1.24 mg/dL   Calcium 9.2 8.9 - 84.6 mg/dL   Total Protein 7.7 6.5 - 8.1 g/dL   Albumin 4.4 3.5 - 5.0 g/dL   AST 43 (H) 15 - 41 U/L   ALT 73 (H) 0 - 44 U/L   Alkaline Phosphatase 81 38 - 126 U/L   Total Bilirubin 1.0 0.3 - 1.2 mg/dL   GFR, Estimated >96 >29 mL/min   Anion gap 15 5 - 15  CBC  Result Value Ref Range   WBC 14.8 (H) 4.0 - 10.5 K/uL   RBC 5.67 4.22 - 5.81 MIL/uL   Hemoglobin 16.8 13.0 - 17.0 g/dL   HCT 52.8 41.3 - 24.4 %   MCV 85.4 80.0 - 100.0 fL    MCH 29.6 26.0 - 34.0 pg   MCHC 34.7 30.0 - 36.0 g/dL   RDW 01.0 27.2 - 53.6 %   Platelets 314 150 - 400 K/uL  nRBC 0.0 0.0 - 0.2 %  Urinalysis, Routine w reflex microscopic -Urine, Clean Catch  Result Value Ref Range   Color, Urine YELLOW YELLOW   APPearance CLEAR CLEAR   Specific Gravity, Urine 1.023 1.005 - 1.030   pH 5.0 5.0 - 8.0   Glucose, UA 150 (A) NEGATIVE mg/dL   Hgb urine dipstick LARGE (A) NEGATIVE   Bilirubin Urine NEGATIVE NEGATIVE   Ketones, ur 80 (A) NEGATIVE mg/dL   Protein, ur NEGATIVE NEGATIVE mg/dL   Nitrite NEGATIVE NEGATIVE   Leukocytes,Ua NEGATIVE NEGATIVE   RBC / HPF 11-20 0 - 5 RBC/hpf   WBC, UA 0-5 0 - 5 WBC/hpf   Bacteria, UA RARE (A) NONE SEEN   Squamous Epithelial / HPF 0-5 0 - 5 /HPF   Mucus PRESENT   CBG monitoring, ED  Result Value Ref Range   Glucose-Capillary 227 (H) 70 - 99 mg/dL    EKG None  Radiology CT Renal Stone Study  Result Date: 04/18/2023 CLINICAL DATA:  Right flank pain, nausea, vomiting EXAM: CT ABDOMEN AND PELVIS WITHOUT CONTRAST TECHNIQUE: Multidetector CT imaging of the abdomen and pelvis was performed following the standard protocol without IV contrast. RADIATION DOSE REDUCTION: This exam was performed according to the departmental dose-optimization program which includes automated exposure control, adjustment of the mA and/or kV according to patient size and/or use of iterative reconstruction technique. COMPARISON:  09/19/2015 FINDINGS: Lower chest: No acute abnormality Hepatobiliary: Severe diffuse fatty infiltration of the liver. Gallbladder unremarkable. No focal hepatic abnormality or biliary ductal dilatation. Pancreas: Fatty replacement. No focal abnormality or ductal dilatation. Spleen: No focal abnormality.  Normal size. Adrenals/Urinary Tract: Mild right hydronephrosis and hydroureter due to 2 mm right UVJ stone. Mild perinephric stranding. Small bilateral nonobstructing renal stones. No hydronephrosis on the left.  Adrenal glands and urinary bladder unremarkable. Stomach/Bowel: Normal appendix. Stomach, large and small bowel grossly unremarkable. Vascular/Lymphatic: No evidence of aneurysm or adenopathy. Reproductive: No visible focal abnormality. Other: No free fluid or free air. Musculoskeletal: No acute bony abnormality. IMPRESSION: 2 mm right UVJ stone with mild right hydronephrosis and perinephric stranding. Bilateral nephrolithiasis. Severe hepatic steatosis. Electronically Signed   By: Charlett Nose M.D.   On: 04/18/2023 22:14    Procedures Procedures    Medications Ordered in ED Medications  HYDROmorphone (DILAUDID) injection 1 mg (has no administration in time range)  HYDROmorphone (DILAUDID) injection 1 mg (1 mg Intravenous Given 04/18/23 2153)  ondansetron (ZOFRAN) injection 4 mg (4 mg Intravenous Given 04/18/23 2153)  ketorolac (TORADOL) 15 MG/ML injection 15 mg (15 mg Intravenous Given 04/18/23 2302)  HYDROmorphone (DILAUDID) injection 1 mg (1 mg Intravenous Given 04/18/23 2305)    ED Course/ Medical Decision Making/ A&P                                 Medical Decision Making Problems Addressed: Bilateral kidney stones: chronic illness or injury Elevated blood pressure reading: acute illness or injury Essential hypertension: chronic illness or injury with exacerbation, progression, or side effects of treatment that poses a threat to life or bodily functions Hyperglycemia: acute illness or injury Hypokalemia: acute illness or injury Right flank pain: acute illness or injury with systemic symptoms that poses a threat to life or bodily functions Right ureteral stone: acute illness or injury with systemic symptoms that poses a threat to life or bodily functions  Amount and/or Complexity of Data Reviewed Independent Historian:  Details: Family, hx External Data Reviewed: notes. Labs: ordered. Decision-making details documented in ED Course. Radiology: ordered and independent  interpretation performed. Decision-making details documented in ED Course.  Risk Prescription drug management. Parenteral controlled substances. Decision regarding hospitalization.   Iv ns. Continuous pulse ox and cardiac monitoring. Labs ordered/sent. Imaging ordered.   Differential diagnosis includes ureteral stone/obstruction, pyelo, etc. Dispo decision including potential need for admission considered - will get labs and imaging and reassess.   Reviewed nursing notes and prior charts for additional history. External reports reviewed. Additional history from: family.   Cardiac monitor: sinus rhythm, rate 83.  Labs reviewed/interpreted by me - k mildly low. Kcl po. Cr normal. Wbc elev. Hct normal. Lipase normal.   Dilaudid iv. Zofran iv.   CT reviewed/interpreted by me - right uvj stone.   Pain persists. Dilaudid iv. Toradol iv.    Pt requests additional dose of pain meds, indicates has chronic neck/back pain and takes meds for pain on chronic basis, also indicates has adequate supply of his home pain meds.   Dilaudid iv.   Recheck pt comfortable. Has ride/family.   Pt currently appears stable for d/c.   Rec close pcp and urology  f/u.  Return precautions provided.          Final Clinical Impression(s) / ED Diagnoses Final diagnoses:  Right flank pain  Right ureteral stone  Elevated blood pressure reading  Essential hypertension    Rx / DC Orders ED Discharge Orders     None         Cathren Laine, MD 04/19/23 0000

## 2023-04-18 NOTE — ED Notes (Signed)
Patient transported to CT 

## 2023-04-19 MED ORDER — ONDANSETRON 4 MG PO TBDP
4.0000 mg | ORAL_TABLET | Freq: Once | ORAL | Status: DC
  Filled 2023-04-19: qty 1

## 2023-04-19 MED ORDER — ALUM & MAG HYDROXIDE-SIMETH 200-200-20 MG/5ML PO SUSP
30.0000 mL | Freq: Once | ORAL | Status: AC
  Administered 2023-04-19: 30 mL via ORAL
  Filled 2023-04-19: qty 30

## 2023-04-21 ENCOUNTER — Encounter (HOSPITAL_COMMUNITY): Payer: Self-pay

## 2023-04-21 ENCOUNTER — Emergency Department (HOSPITAL_COMMUNITY)
Admission: EM | Admit: 2023-04-21 | Discharge: 2023-04-21 | Disposition: A | Discharge status: Home / Self Care | Payer: Managed Care, Other (non HMO) | Attending: Emergency Medicine | Admitting: Emergency Medicine

## 2023-04-21 ENCOUNTER — Other Ambulatory Visit: Payer: Self-pay

## 2023-04-21 DIAGNOSIS — R109 Unspecified abdominal pain: Secondary | ICD-10-CM | POA: Diagnosis present

## 2023-04-21 DIAGNOSIS — Z79899 Other long term (current) drug therapy: Secondary | ICD-10-CM | POA: Insufficient documentation

## 2023-04-21 DIAGNOSIS — N23 Unspecified renal colic: Secondary | ICD-10-CM | POA: Diagnosis not present

## 2023-04-21 DIAGNOSIS — I10 Essential (primary) hypertension: Secondary | ICD-10-CM | POA: Diagnosis not present

## 2023-04-21 LAB — CBC WITH DIFFERENTIAL/PLATELET
Abs Immature Granulocytes: 0.05 10*3/uL (ref 0.00–0.07)
Basophils Absolute: 0.1 10*3/uL (ref 0.0–0.1)
Basophils Relative: 1 %
Eosinophils Absolute: 0 10*3/uL (ref 0.0–0.5)
Eosinophils Relative: 0 %
HCT: 46.8 % (ref 39.0–52.0)
Hemoglobin: 16.2 g/dL (ref 13.0–17.0)
Immature Granulocytes: 1 %
Lymphocytes Relative: 17 %
Lymphs Abs: 1.7 10*3/uL (ref 0.7–4.0)
MCH: 29.8 pg (ref 26.0–34.0)
MCHC: 34.6 g/dL (ref 30.0–36.0)
MCV: 86 fL (ref 80.0–100.0)
Monocytes Absolute: 0.6 10*3/uL (ref 0.1–1.0)
Monocytes Relative: 6 %
Neutro Abs: 7.4 10*3/uL (ref 1.7–7.7)
Neutrophils Relative %: 75 %
Platelets: 292 10*3/uL (ref 150–400)
RBC: 5.44 MIL/uL (ref 4.22–5.81)
RDW: 12.2 % (ref 11.5–15.5)
WBC: 9.8 10*3/uL (ref 4.0–10.5)
nRBC: 0 % (ref 0.0–0.2)

## 2023-04-21 LAB — URINALYSIS, ROUTINE W REFLEX MICROSCOPIC
Bacteria, UA: NONE SEEN
Bilirubin Urine: NEGATIVE
Glucose, UA: NEGATIVE mg/dL
Ketones, ur: 80 mg/dL — AB
Leukocytes,Ua: NEGATIVE
Nitrite: NEGATIVE
Protein, ur: NEGATIVE mg/dL
Specific Gravity, Urine: 1.02 (ref 1.005–1.030)
pH: 6 (ref 5.0–8.0)

## 2023-04-21 LAB — BASIC METABOLIC PANEL
Anion gap: 12 (ref 5–15)
BUN: 31 mg/dL — ABNORMAL HIGH (ref 6–20)
CO2: 25 mmol/L (ref 22–32)
Calcium: 8.9 mg/dL (ref 8.9–10.3)
Chloride: 101 mmol/L (ref 98–111)
Creatinine, Ser: 1.5 mg/dL — ABNORMAL HIGH (ref 0.61–1.24)
GFR, Estimated: 54 mL/min — ABNORMAL LOW (ref >60)
Glucose, Bld: 217 mg/dL — ABNORMAL HIGH (ref 70–99)
Potassium: 3.6 mmol/L (ref 3.5–5.1)
Sodium: 138 mmol/L (ref 135–145)

## 2023-04-21 MED ORDER — SODIUM CHLORIDE 0.9 % IV SOLN
1.5000 mg/kg | Freq: Once | INTRAVENOUS | Status: DC
Start: 2023-04-21 — End: 2023-04-21

## 2023-04-21 MED ORDER — ONDANSETRON HCL 4 MG/2ML IJ SOLN
4.0000 mg | Freq: Once | INTRAMUSCULAR | Status: AC
  Administered 2023-04-21: 4 mg via INTRAVENOUS
  Filled 2023-04-21: qty 2

## 2023-04-21 MED ORDER — HYDROMORPHONE HCL 1 MG/ML IJ SOLN
1.0000 mg | Freq: Once | INTRAMUSCULAR | Status: AC
  Administered 2023-04-21: 1 mg via INTRAVENOUS
  Filled 2023-04-21: qty 1

## 2023-04-21 MED ORDER — HYDROMORPHONE HCL 1 MG/ML IJ SOLN
0.5000 mg | Freq: Once | INTRAMUSCULAR | Status: AC
  Administered 2023-04-21: 0.5 mg via INTRAVENOUS
  Filled 2023-04-21: qty 0.5

## 2023-04-21 MED ORDER — TAMSULOSIN HCL 0.4 MG PO CAPS
0.4000 mg | ORAL_CAPSULE | Freq: Once | ORAL | Status: AC
  Administered 2023-04-21: 0.4 mg via ORAL
  Filled 2023-04-21: qty 1

## 2023-04-21 MED ORDER — FENTANYL CITRATE (PF) 100 MCG/2ML IJ SOLN
100.0000 ug | Freq: Once | INTRAMUSCULAR | Status: AC
  Administered 2023-04-21: 100 ug via INTRAVENOUS
  Filled 2023-04-21: qty 2

## 2023-04-21 MED ORDER — TAMSULOSIN HCL 0.4 MG PO CAPS: 0.4000 mg | ORAL_CAPSULE | Freq: Every day | ORAL | 0 refills | Status: DC

## 2023-04-21 MED ORDER — PROCHLORPERAZINE EDISYLATE 10 MG/2ML IJ SOLN
10.0000 mg | Freq: Once | INTRAMUSCULAR | Status: AC
  Administered 2023-04-21: 10 mg via INTRAVENOUS
  Filled 2023-04-21: qty 2

## 2023-04-21 MED ORDER — KETOROLAC TROMETHAMINE 15 MG/ML IJ SOLN
15.0000 mg | Freq: Once | INTRAMUSCULAR | Status: AC
  Administered 2023-04-21: 15 mg via INTRAVENOUS
  Filled 2023-04-21: qty 1

## 2023-04-21 NOTE — ED Triage Notes (Signed)
Pt arrives with c/o right flank and side pain that started 3-4 days ago. Pt diagnosed with kidney stones. Per pt, pain has worsened tonight. Pt endorses n/v. Pt denies fevers.

## 2023-04-21 NOTE — ED Provider Notes (Signed)
[place] [facility]GENCY DEPARTMENT AT [facility] Provider Note   CSN: [id number] Arrival date & time: 04/21/23  4098     History  Chief Complaint  Patient presents with   Flank Pain    Bryan Gilbert is a 57 y.o. male.   Flank Pain Associated symptoms include abdominal pain. Pertinent negatives include no chest pain.   Patient with history of hypertension, chronic pain, abdominal migraine presents for recurrent right-sided abdominal pain likely related to recent kidney stone  The first pain episode started on October 13 and he was seen in the emergency department and diagnosed with a right-sided kidney stone per CT imaging.  Patient had felt improved and went home, but this pain returned over the past day.  He reports it is mostly in his right abdomen, no flank pain, no no testicular pain and no dysuria. He reports associated nausea and vomiting Past Medical History:  Diagnosis Date   Abdominal migraine    Cervical neuralgia    Cyclical vomiting    Elevated hemoglobin (HCC)    Hypertension    Sleep apnea    states uses O2 and CPAP at night   Slipped intervertebral disc     Home Medications Prior to Admission medications   Medication Sig Start Date End Date Taking? Authorizing Provider  tamsulosin (FLOMAX) 0.4 MG CAPS capsule Take 1 capsule (0.4 mg total) by mouth daily after supper. 04/21/23  Yes Zadie Rhine, MD  butorphanol (STADOL) 10 MG/ML nasal spray Place 1 spray into the nose every 4 (four) hours as needed for headache or migraine.  07/28/17   [provider]  cyclobenzaprine (FLEXERIL) 10 MG tablet Take 10 mg by mouth 3 (three) times daily as needed for muscle spasms.     [provider]  levorphanol (LEVODROMORAN) 2 MG tablet  06/30/20   [provider]  lisinopril-hydrochlorothiazide (ZESTORETIC) 10-12.5 MG tablet Take 1 tablet by mouth daily. 02/03/23   Babs Sciara, MD  loperamide (IMODIUM) 2 MG capsule Take 2 mg by mouth  as needed for diarrhea or loose stools.    [provider]  meloxicam (MOBIC) 15 MG tablet Take 1 tablet (15 mg total) by mouth daily. 02/03/23   Babs Sciara, MD  ondansetron (ZOFRAN-ODT) 8 MG disintegrating tablet Take 1 tablet (8 mg total) by mouth every 8 (eight) hours as needed for nausea or vomiting. 04/18/23   Cathren Laine, MD  potassium chloride SA (KLOR-CON M) 20 MEQ tablet One po bid x two days, then one po once a day 04/18/23   Cathren Laine, MD  SALINE NASAL SPRAY NA Place 1 spray into the nose daily as needed (dryness). Reported on 09/17/2015    [provider]  sertraline (ZOLOFT) 50 MG tablet TAKE 1 TABLET BY MOUTH EVERY DAY 02/03/23   Babs Sciara, MD  simethicone (MYLICON) 80 MG chewable tablet Chew 80 mg by mouth every 6 (six) hours as needed for flatulence.    [provider]  tadalafil (CIALIS) 20 MG tablet Take 0.5-1 tablets (10-20 mg total) by mouth every other day as needed for erectile dysfunction. 02/03/23   Babs Sciara, MD  Testosterone 20.25 MG/ACT (1.62%) GEL APPLY 3 PUMPS TOPICALLY DAILY 02/02/23   McKenzie, Mardene Celeste, MD      Allergies    Cefprozil, Lipitor [atorvastatin], and Xtandi [enzalutamide]    Review of Systems   Review of Systems  Constitutional:  Negative for fever.  Cardiovascular:  Negative for chest pain.  Gastrointestinal:  Positive for abdominal pain and vomiting.  Genitourinary:  Negative for dysuria, hematuria and testicular pain.    Physical Exam Updated Vital Signs BP 116/65   Pulse 71   Temp (!) 97 F (36.1 C) (Temporal)   Resp 18   Wt 104.3 kg   SpO2 93%   BMI 36.02 kg/m  Physical Exam CONSTITUTIONAL: Well developed/well nourished uncomfortable appearing, diaphoretic HEAD: Normocephalic/atraumatic ENMT: Mucous membranes moist NECK: supple no meningeal signs CV: S1/S2 noted, no murmurs/rubs/gallops noted LUNGS: Lungs are clear to auscultation bilaterally, no apparent distress ABDOMEN: soft,  moderate right-sided tenderness, no rebound or guarding, bowel sounds noted throughout abdomen GU:no cva tenderness NEURO: Pt is awake/alert/appropriate, moves all extremitiesx4.  No facial droop.   EXTREMITIES: pulses normal/equal, full ROM SKIN: warm, color normal PSYCH: Anxious  ED Results / Procedures / Treatments   Labs (all labs ordered are listed, but only abnormal results are displayed) Labs Reviewed  BASIC METABOLIC PANEL - Abnormal; Notable for the following components:      Result Value   Glucose, Bld 217 (*)    BUN 31 (*)    Creatinine, Ser 1.50 (*)    GFR, Estimated 54 (*)    All other components within normal limits  URINALYSIS, ROUTINE W REFLEX MICROSCOPIC - Abnormal; Notable for the following components:   Hgb urine dipstick MODERATE (*)    Ketones, ur 80 (*)    All other components within normal limits  CBC WITH DIFFERENTIAL/PLATELET    EKG None  Radiology No results found.  Procedures Procedures    Medications Ordered in ED Medications  ketorolac (TORADOL) 15 MG/ML injection 15 mg (15 mg Intravenous Given 04/21/23 0336)  HYDROmorphone (DILAUDID) injection 0.5 mg (0.5 mg Intravenous Given 04/21/23 0337)  ondansetron (ZOFRAN) injection 4 mg (4 mg Intravenous Given 04/21/23 0342)  HYDROmorphone (DILAUDID) injection 1 mg (1 mg Intravenous Given 04/21/23 0416)  ondansetron (ZOFRAN) injection 4 mg (4 mg Intravenous Given 04/21/23 0416)  ondansetron (ZOFRAN) injection 4 mg (4 mg Intravenous Given 04/21/23 0500)  prochlorperazine (COMPAZINE) injection 10 mg (10 mg Intravenous Given 04/21/23 0550)  fentaNYL (SUBLIMAZE) injection 100 mcg (100 mcg Intravenous Given 04/21/23 0608)  tamsulosin (FLOMAX) capsule 0.4 mg (0.4 mg Oral Given 04/21/23 0272)    ED Course/ Medical Decision Making/ A&P Clinical Course as of 04/21/23 0706  Wed Apr 21, 2023  0628 Patient with intractable renal colic.  Patient recently found to have a 2 mm UVJ stone.  No signs of urinary  tract infection.  He does have mild renal insufficiency.  He has been given multiple rounds of pain medicine including Toradol without much relief. [DW]  5366 I discussed the case with on-call urology Dr. Marlou Porch He recommends starting Flomax, bladder scan to ensure no urinary retention.  Patient may also benefit from IV lidocaine infusion which has been used in renal colic [DW]  0704 Patient feeling much improved.  He reports the fentanyl helped his pain and he did start the Flomax.  No signs of urinary obstruction as his bladder scan was nearly empty  Will defer the lidocaine for now [DW]  0705 Will start him on Flomax, he is to call his urologist today. Patient overall well-appearing, no signs of sepsis [DW]    Clinical Course User Index [DW] Zadie Rhine, MD                                 Medical  Decision Making Amount and/or Complexity of Data Reviewed Labs: ordered.  Risk Prescription drug management.   This patient presents to the ED for concern of abdominal pain, this involves an extensive number of treatment options, and is a complaint that carries with it a high risk of complications and morbidity.  The differential diagnosis includes but is not limited to cholecystitis, cholelithiasis, pancreatitis, gastritis, peptic ulcer disease, appendicitis, bowel obstruction, bowel perforation, diverticulitis, AAA, ischemic bowel, pyelonephritis, ureteral stone   Comorbidities that complicate the patient evaluation: Patient's presentation is complicated by their history of chronic pain   Additional history obtained: Records reviewed  outpatient urology records reviewed  Lab Tests: I Ordered, and personally interpreted labs.  The pertinent results include: Renal insufficiency  Medicines ordered and prescription drug management: I ordered medication including Toradol and Dilaudid for pain Reevaluation of the patient after these medicines showed that the patient    stayed the  same  Consultations Obtained: I requested consultation with the consultant urology , and discussed  findings as well as pertinent plan - they recommend: medication recommendations given  Reevaluation: After the interventions noted above, I reevaluated the patient and found that they have :improved  Complexity of problems addressed: Patient's presentation is most consistent with  acute presentation with potential threat to life or bodily function  Disposition:  After consideration of the diagnostic results and the patient's response to treatment,  I feel that the patent would benefit from discharge   .           Final Clinical Impression(s) / ED Diagnoses Final diagnoses:  Ureteral colic    Rx / DC Orders ED Discharge Orders          Ordered    tamsulosin (FLOMAX) 0.4 MG CAPS capsule  Daily after supper        04/21/23 0701              Zadie Rhine, MD 04/21/23 [id number]

## 2023-04-21 NOTE — ED Notes (Signed)
ED Provider at bedside. 

## 2023-05-22 ENCOUNTER — Other Ambulatory Visit: Payer: Self-pay | Admitting: Urology

## 2023-06-11 ENCOUNTER — Other Ambulatory Visit: Payer: Managed Care, Other (non HMO)

## 2023-06-11 ENCOUNTER — Other Ambulatory Visit: Payer: Self-pay

## 2023-06-11 DIAGNOSIS — E291 Testicular hypofunction: Secondary | ICD-10-CM

## 2023-06-12 ENCOUNTER — Other Ambulatory Visit: Payer: Self-pay | Admitting: Family Medicine

## 2023-06-13 LAB — TESTOSTERONE,FREE AND TOTAL
Testosterone, Free: 5 pg/mL — ABNORMAL LOW (ref 7.2–24.0)
Testosterone: 216 ng/dL — ABNORMAL LOW (ref 264–916)

## 2023-06-14 NOTE — Telephone Encounter (Signed)
This could be an inappropriate refill request by pharmacy I recommend talking with patient to see what is going on regarding this prescription since we discontinued it back in July

## 2023-06-18 ENCOUNTER — Ambulatory Visit: Payer: Managed Care, Other (non HMO) | Admitting: Urology

## 2023-06-18 VITALS — BP 148/96 | HR 83

## 2023-06-18 DIAGNOSIS — N200 Calculus of kidney: Secondary | ICD-10-CM | POA: Diagnosis not present

## 2023-06-18 DIAGNOSIS — N486 Induration penis plastica: Secondary | ICD-10-CM

## 2023-06-18 DIAGNOSIS — E291 Testicular hypofunction: Secondary | ICD-10-CM

## 2023-06-18 MED ORDER — TESTOSTERONE 20.25 MG/ACT (1.62%) TD GEL: 3.0000 | Freq: Every day | TRANSDERMAL | 3 refills | Status: DC

## 2023-06-18 MED ORDER — JATENZO 237 MG PO CAPS: 1.0000 | ORAL_CAPSULE | Freq: Two times a day (BID) | ORAL | 5 refills | Status: DC

## 2023-06-18 NOTE — Progress Notes (Unsigned)
06/18/2023 12:57 PM   Bryan Gilbert 1965-09-12 [id number]  Referring provider: Babs Sciara, MD [address] [place],  [place] [address]  Followup hypogonadism   HPI: Bryan Gilbert is a 57yo here for followup for hypogonadism and peyronies disease. He has a kidney stone 2 months ago. He underwent CT which showed bilateral 2-65mm calculi. Testosterone 216 on androgel 3 pumps daily. His energy has improved. Hemoglobin 16.2   PMH: Past Medical History:  Diagnosis Date   Abdominal migraine    Cervical neuralgia    Cyclical vomiting    Elevated hemoglobin (HCC)    Hypertension    Sleep apnea    states uses O2 and CPAP at night   Slipped intervertebral disc     Surgical History: Past Surgical History:  Procedure Laterality Date   ESOPHAGOGASTRODUODENOSCOPY      Home Medications:  Allergies as of 06/18/2023       Reactions   Cefprozil Diarrhea, Nausea Only   Intestinal upset and diarrhea   Lipitor [atorvastatin] Other (See Comments)   Myalgia   Xtandi [enzalutamide]    GI upset        Medication List        Accurate as of June 18, 2023 12:57 PM. If you have any questions, ask your nurse or doctor.          STOP taking these medications    potassium chloride SA 20 MEQ tablet Commonly known as: KLOR-CON M   tamsulosin 0.4 MG Caps capsule Commonly known as: FLOMAX       TAKE these medications    butorphanol 10 MG/ML nasal spray Commonly known as: STADOL Place 1 spray into the nose every 4 (four) hours as needed for headache or migraine.   cyclobenzaprine 10 MG tablet Commonly known as: FLEXERIL Take 10 mg by mouth 3 (three) times daily as needed for muscle spasms.   levorphanol 2 MG tablet Commonly known as: LEVODROMORAN   lisinopril-hydrochlorothiazide 10-12.5 MG tablet Commonly known as: ZESTORETIC Take 1 tablet by mouth daily.   loperamide 2 MG capsule Commonly known as: IMODIUM Take 2 mg by mouth as needed for diarrhea or  loose stools.   meloxicam 15 MG tablet Commonly known as: MOBIC Take 1 tablet (15 mg total) by mouth daily.   ondansetron 8 MG disintegrating tablet Commonly known as: ZOFRAN-ODT Take 1 tablet (8 mg total) by mouth every 8 (eight) hours as needed for nausea or vomiting.   SALINE NASAL SPRAY NA Place 1 spray into the nose daily as needed (dryness). Reported on 09/17/2015   sertraline 50 MG tablet Commonly known as: ZOLOFT TAKE 1 TABLET BY MOUTH EVERY DAY   simethicone 80 MG chewable tablet Commonly known as: MYLICON Chew 80 mg by mouth every 6 (six) hours as needed for flatulence.   tadalafil 20 MG tablet Commonly known as: CIALIS Take 0.5-1 tablets (10-20 mg total) by mouth every other day as needed for erectile dysfunction.   Testosterone 20.25 MG/ACT (1.62%) Gel APPLY 3 PUMPS TOPICALLY DAILY        Allergies:  Allergies  Allergen Reactions   Cefprozil Diarrhea and Nausea Only    Intestinal upset and diarrhea   Lipitor [Atorvastatin] Other (See Comments)    Myalgia   Xtandi [Enzalutamide]     GI upset    Family History: Family History  Problem Relation Age of Onset   Heart failure Father    Hypertension Father     Social History:  reports that he has never smoked. He has never used smokeless tobacco. He reports current alcohol use. He reports that he does not use drugs.  ROS: All other review of systems were reviewed and are negative except what is noted above in HPI  Physical Exam: BP (!) 148/96   Pulse 83   Constitutional:  Alert and oriented, No acute distress. HEENT: [place] AT, moist mucus membranes.  Trachea midline, no masses. Cardiovascular: No clubbing, cyanosis, or edema. Respiratory: Normal respiratory effort, no increased work of breathing. GI: Abdomen is soft, nontender, nondistended, no abdominal masses GU: No CVA tenderness.  Lymph: No cervical or inguinal lymphadenopathy. Skin: No rashes, bruises or suspicious lesions. Neurologic: Grossly  intact, no focal deficits, moving all 4 extremities. Psychiatric: Normal mood and affect.  Laboratory Data: Lab Results  Component Value Date   WBC 9.8 04/21/2023   HGB 16.2 04/21/2023   HCT 46.8 04/21/2023   MCV 86.0 04/21/2023   PLT 292 04/21/2023    Lab Results  Component Value Date   CREATININE 1.50 (H) 04/21/2023    No results found for: "PSA"  Lab Results  Component Value Date   TESTOSTERONE 216 (L) 06/11/2023    Lab Results  Component Value Date   HGBA1C 6.1 (H) 01/31/2021    Urinalysis    Component Value Date/Time   COLORURINE YELLOW 04/21/2023 0242   APPEARANCEUR CLEAR 04/21/2023 0242   APPEARANCEUR Clear 12/18/2022 1242   LABSPEC 1.020 04/21/2023 0242   PHURINE 6.0 04/21/2023 0242   GLUCOSEU NEGATIVE 04/21/2023 0242   HGBUR MODERATE (A) 04/21/2023 0242   BILIRUBINUR NEGATIVE 04/21/2023 0242   BILIRUBINUR Negative 12/18/2022 1242   KETONESUR 80 (A) 04/21/2023 0242   PROTEINUR NEGATIVE 04/21/2023 0242   UROBILINOGEN 0.2 05/19/2013 1732   NITRITE NEGATIVE 04/21/2023 0242   LEUKOCYTESUR NEGATIVE 04/21/2023 0242    Lab Results  Component Value Date   LABMICR Comment 12/18/2022   BACTERIA NONE SEEN 04/21/2023    Pertinent Imaging: CT 04/18/2023: Images reviewed and discussed with the patient  No results found for this or any previous visit.  No results found for this or any previous visit.  No results found for this or any previous visit.  No results found for this or any previous visit.  No results found for this or any previous visit.  No results found for this or any previous visit.  No results found for this or any previous visit.  Results for orders placed during the hospital encounter of 04/18/23  CT Renal Stone Study  Narrative CLINICAL DATA:  Right flank pain, nausea, vomiting  EXAM: CT ABDOMEN AND PELVIS WITHOUT CONTRAST  TECHNIQUE: Multidetector CT imaging of the abdomen and pelvis was performed following the standard  protocol without IV contrast.  RADIATION DOSE REDUCTION: This exam was performed according to the departmental dose-optimization program which includes automated exposure control, adjustment of the mA and/or kV according to patient size and/or use of iterative reconstruction technique.  COMPARISON:  09/19/2015  FINDINGS: Lower chest: No acute abnormality  Hepatobiliary: Severe diffuse fatty infiltration of the liver. Gallbladder unremarkable. No focal hepatic abnormality or biliary ductal dilatation.  Pancreas: Fatty replacement. No focal abnormality or ductal dilatation.  Spleen: No focal abnormality.  Normal size.  Adrenals/Urinary Tract: Mild right hydronephrosis and hydroureter due to 2 mm right UVJ stone. Mild perinephric stranding. Small bilateral nonobstructing renal stones. No hydronephrosis on the left. Adrenal glands and urinary bladder unremarkable.  Stomach/Bowel: Normal appendix. Stomach, large and small bowel grossly unremarkable.  Vascular/Lymphatic: No evidence of aneurysm or adenopathy.  Reproductive: No visible focal abnormality.  Other: No free fluid or free air.  Musculoskeletal: No acute bony abnormality.  IMPRESSION: 2 mm right UVJ stone with mild right hydronephrosis and perinephric stranding.  Bilateral nephrolithiasis.  Severe hepatic steatosis.   Electronically Signed By: Charlett Nose M.D. On: 04/18/2023 22:14   Assessment & Plan:    1. 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 nephrolithiasis -dietary handout given -followup 6 months with [facility]   No follow-ups on file.  Wilkie Aye, MD  [name] [place]

## 2023-06-22 ENCOUNTER — Encounter: Payer: Self-pay | Admitting: Urology

## 2023-06-22 NOTE — Patient Instructions (Signed)

## 2023-08-11 ENCOUNTER — Ambulatory Visit: Payer: No Typology Code available for payment source | Admitting: Family Medicine

## 2023-08-11 VITALS — BP 122/70 | HR 86 | Temp 97.9°F | Ht 67.0 in | Wt 249.0 lb

## 2023-08-11 DIAGNOSIS — K760 Fatty (change of) liver, not elsewhere classified: Secondary | ICD-10-CM

## 2023-08-11 DIAGNOSIS — E7849 Other hyperlipidemia: Secondary | ICD-10-CM | POA: Diagnosis not present

## 2023-08-11 DIAGNOSIS — I10 Essential (primary) hypertension: Secondary | ICD-10-CM

## 2023-08-11 DIAGNOSIS — Z125 Encounter for screening for malignant neoplasm of prostate: Secondary | ICD-10-CM

## 2023-08-11 DIAGNOSIS — R7303 Prediabetes: Secondary | ICD-10-CM

## 2023-08-11 NOTE — Progress Notes (Signed)
   Subjective:    Patient ID: Bryan Gilbert, male    DOB: [birth year], 58 y.o.   MRN: [id number]  HPI Primary hypertension - Plan: Basic Metabolic Panel, Microalbumin / creatinine urine ratio  Prediabetes - Plan: Hemoglobin A1c  Other hyperlipidemia - Plan: Lipid Panel  Screening PSA (prostate specific antigen) - Plan: PSA  Fatty liver - Plan: Hepatic Function Panel  Patient for blood pressure check up.  The patient does have hypertension.   Patient relates dietary measures try to minimize salt The importance of healthy diet and activity were discussed Patient relates compliance  Patient is on testosterone .  Has lab work followed by his urologist Has not had a PSA in the past year Patient also due for lipid profile.  Patient does use meloxicam  occasionally for discomfort Patient relates he is trying to improve his diet to help lose some weight    Review of Systems     Objective:   Physical Exam  General-in no acute distress Eyes-no discharge Lungs-respiratory rate normal, CTA CV-no murmurs,RRR Extremities skin warm dry no edema Neuro grossly normal Behavior normal, alert       Assessment & Plan:  1. Primary hypertension (Primary) HTN- patient seen for follow-up regarding HTN.   Diet, medication compliance, appropriate labs and refills were completed.   Importance of keeping blood pressure under good control to lessen the risk of complications discussed Regular follow-up visits discussed  - Basic Metabolic Panel - Microalbumin / creatinine urine ratio  2. Prediabetes Minimize starches tried increase activity watch portions follow-up 6 months - Hemoglobin A1c  3. Other hyperlipidemia Healthy diet check lipid profile - Lipid Panel  4. Screening PSA (prostate specific antigen) Check PSA - PSA  5. Fatty liver Noted on previous scan lab work to look at liver enzymes healthy diet portion control regular activity recommended - Hepatic Function  Panel  Follow-up 6 months

## 2023-08-12 LAB — HEPATIC FUNCTION PANEL
ALT: 79 [IU]/L — ABNORMAL HIGH (ref 0–44)
AST: 49 [IU]/L — ABNORMAL HIGH (ref 0–40)
Albumin: 4.5 g/dL (ref 3.8–4.9)
Alkaline Phosphatase: 110 [IU]/L (ref 44–121)
Bilirubin Total: 0.6 mg/dL (ref 0.0–1.2)
Bilirubin, Direct: 0.18 mg/dL (ref 0.00–0.40)
Total Protein: 7.2 g/dL (ref 6.0–8.5)

## 2023-08-12 LAB — BASIC METABOLIC PANEL
BUN/Creatinine Ratio: 17 (ref 9–20)
BUN: 14 mg/dL (ref 6–24)
CO2: 25 mmol/L (ref 20–29)
Calcium: 9.5 mg/dL (ref 8.7–10.2)
Chloride: 97 mmol/L (ref 96–106)
Creatinine, Ser: 0.84 mg/dL (ref 0.76–1.27)
Glucose: 147 mg/dL — ABNORMAL HIGH (ref 70–99)
Potassium: 4.2 mmol/L (ref 3.5–5.2)
Sodium: 140 mmol/L (ref 134–144)
eGFR: 102 mL/min/{1.73_m2} (ref >59)

## 2023-08-12 LAB — LIPID PANEL
Chol/HDL Ratio: 5.4 {ratio} — ABNORMAL HIGH (ref 0.0–5.0)
Cholesterol, Total: 206 mg/dL — ABNORMAL HIGH (ref 100–199)
HDL: 38 mg/dL — ABNORMAL LOW (ref >39)
LDL Chol Calc (NIH): 135 mg/dL — ABNORMAL HIGH (ref 0–99)
Triglycerides: 181 mg/dL — ABNORMAL HIGH (ref 0–149)
VLDL Cholesterol Cal: 33 mg/dL (ref 5–40)

## 2023-08-12 LAB — MICROALBUMIN / CREATININE URINE RATIO
Creatinine, Urine: 301.5 mg/dL
Microalb/Creat Ratio: 12 mg/g{creat} (ref 0–29)
Microalbumin, Urine: 37.5 ug/mL

## 2023-08-12 LAB — PSA: Prostate Specific Ag, Serum: 0.3 ng/mL (ref 0.0–4.0)

## 2023-08-12 LAB — HEMOGLOBIN A1C
Est. average glucose Bld gHb Est-mCnc: 189 mg/dL
Hgb A1c MFr Bld: 8.2 % — ABNORMAL HIGH (ref 4.8–5.6)

## 2023-08-14 ENCOUNTER — Encounter: Payer: Self-pay | Admitting: Family Medicine

## 2023-08-26 ENCOUNTER — Encounter: Payer: Self-pay | Admitting: Urology

## 2023-08-27 NOTE — Telephone Encounter (Signed)
Medication prior authorization request received.  Completed PA request through cover my meds for drug [employer]. KEY: [id number]  Approved: Pending

## 2023-09-10 ENCOUNTER — Other Ambulatory Visit: Payer: Self-pay | Admitting: Family Medicine

## 2023-10-10 ENCOUNTER — Other Ambulatory Visit: Payer: Self-pay | Admitting: Family Medicine

## 2023-10-11 ENCOUNTER — Other Ambulatory Visit: Payer: Self-pay

## 2023-10-11 MED ORDER — LISINOPRIL-HYDROCHLOROTHIAZIDE 10-12.5 MG PO TABS: 1.0000 | ORAL_TABLET | Freq: Every day | ORAL | 1 refills | Status: DC

## 2023-10-12 ENCOUNTER — Other Ambulatory Visit: Payer: Self-pay | Admitting: Family Medicine

## 2023-10-27 ENCOUNTER — Other Ambulatory Visit: Payer: Self-pay | Admitting: Urology

## 2023-12-03 ENCOUNTER — Other Ambulatory Visit: Payer: Managed Care, Other (non HMO)

## 2023-12-03 DIAGNOSIS — E291 Testicular hypofunction: Secondary | ICD-10-CM

## 2023-12-05 LAB — COMPREHENSIVE METABOLIC PANEL WITH GFR
ALT: 70 IU/L — ABNORMAL HIGH (ref 0–44)
AST: 42 IU/L — ABNORMAL HIGH (ref 0–40)
Albumin: 4.5 g/dL (ref 3.8–4.9)
Alkaline Phosphatase: 97 IU/L (ref 44–121)
BUN/Creatinine Ratio: 16 (ref 9–20)
BUN: 17 mg/dL (ref 6–24)
Bilirubin Total: 0.8 mg/dL (ref 0.0–1.2)
CO2: 25 mmol/L (ref 20–29)
Calcium: 10.3 mg/dL — ABNORMAL HIGH (ref 8.7–10.2)
Chloride: 97 mmol/L (ref 96–106)
Creatinine, Ser: 1.05 mg/dL (ref 0.76–1.27)
Globulin, Total: 2.7 g/dL (ref 1.5–4.5)
Glucose: 234 mg/dL — ABNORMAL HIGH (ref 70–99)
Potassium: 4.7 mmol/L (ref 3.5–5.2)
Sodium: 141 mmol/L (ref 134–144)
Total Protein: 7.2 g/dL (ref 6.0–8.5)
eGFR: 83 mL/min/{1.73_m2} (ref >59)

## 2023-12-05 LAB — CBC
Hematocrit: 59.2 % — ABNORMAL HIGH (ref 37.5–51.0)
Hemoglobin: 19.6 g/dL — ABNORMAL HIGH (ref 13.0–17.7)
MCH: 30.4 pg (ref 26.6–33.0)
MCHC: 33.1 g/dL (ref 31.5–35.7)
MCV: 92 fL (ref 79–97)
Platelets: 296 10*3/uL (ref 150–450)
RBC: 6.45 x10E6/uL — ABNORMAL HIGH (ref 4.14–5.80)
RDW: 12.7 % (ref 11.6–15.4)
WBC: 8.9 10*3/uL (ref 3.4–10.8)

## 2023-12-05 LAB — TESTOSTERONE,FREE AND TOTAL
Testosterone, Free: 47 pg/mL — ABNORMAL HIGH (ref 7.2–24.0)
Testosterone: 612 ng/dL (ref 264–916)

## 2023-12-07 ENCOUNTER — Ambulatory Visit: Payer: Self-pay

## 2023-12-08 ENCOUNTER — Other Ambulatory Visit: Payer: Self-pay | Admitting: Family Medicine

## 2023-12-10 ENCOUNTER — Ambulatory Visit: Payer: Managed Care, Other (non HMO) | Admitting: Urology

## 2023-12-10 VITALS — BP 137/89 | HR 85

## 2023-12-10 DIAGNOSIS — E291 Testicular hypofunction: Secondary | ICD-10-CM

## 2023-12-10 DIAGNOSIS — N200 Calculus of kidney: Secondary | ICD-10-CM | POA: Diagnosis not present

## 2023-12-10 MED ORDER — JATENZO 237 MG PO CAPS: 1.0000 | ORAL_CAPSULE | Freq: Two times a day (BID) | ORAL | 5 refills | Status: DC

## 2023-12-10 NOTE — Progress Notes (Signed)
 12/10/2023 12:34 PM   Bryan Gilbert 10-12-1965 [id number]  Referring provider: Bennet Brasil, MD [address] [place],  [place] [address]  Followup hypogonadism   HPI: Bryan Gilbert is a 58yo here for followup for hypogonadism. He has good energy and rare fatigue. He has good libido. He is on Jatenzo  237mg  BID. Testosterone  612. Hemoglobin 19.2.    PMH: Past Medical History:  Diagnosis Date   Abdominal migraine    Cervical neuralgia    Cyclical vomiting    Elevated hemoglobin (HCC)    Hypertension    Sleep apnea    states uses O2 and CPAP at night   Slipped intervertebral disc     Surgical History: Past Surgical History:  Procedure Laterality Date   ESOPHAGOGASTRODUODENOSCOPY      Home Medications:  Allergies as of 12/10/2023       Reactions   Cefprozil  Diarrhea, Nausea Only   Intestinal upset and diarrhea   Lipitor [atorvastatin] Other (See Comments)   Myalgia   Xtandi [enzalutamide]    GI upset        Medication List        Accurate as of December 10, 2023 12:34 PM. If you have any questions, ask your nurse or doctor.          butorphanol  10 MG/ML nasal spray Commonly known as: STADOL  Place 1 spray into the nose every 4 (four) hours as needed for headache or migraine.   cyclobenzaprine  10 MG tablet Commonly known as: FLEXERIL  Take 10 mg by mouth 3 (three) times daily as needed for muscle spasms.   Jatenzo  237 MG Caps Generic drug: Testosterone  Undecanoate Take 1 capsule (237 mg total) by mouth 2 (two) times daily.   levorphanol 2 MG tablet Commonly known as: LEVODROMORAN   lisinopril -hydrochlorothiazide  10-12.5 MG tablet Commonly known as: ZESTORETIC  Take 1 tablet by mouth daily.   loperamide  2 MG capsule Commonly known as: IMODIUM  Take 2 mg by mouth as needed for diarrhea or loose stools.   meloxicam  15 MG tablet Commonly known as: MOBIC  TAKE 1 TABLET (15 MG TOTAL) BY MOUTH DAILY.   SALINE NASAL SPRAY NA Place 1 spray into the  nose daily as needed (dryness). Reported on 09/17/2015   sertraline  50 MG tablet Commonly known as: ZOLOFT  TAKE 1 TABLET BY MOUTH EVERY DAY   simethicone  80 MG chewable tablet Commonly known as: MYLICON Chew 80 mg by mouth every 6 (six) hours as needed for flatulence.   tadalafil  20 MG tablet Commonly known as: CIALIS  Take 0.5-1 tablets (10-20 mg total) by mouth every other day as needed for erectile dysfunction.   Testosterone  20.25 MG/ACT (1.62%) Gel APPLY 3 PUMP TOPICALLY DAILY.        Allergies:  Allergies  Allergen Reactions   Cefprozil  Diarrhea and Nausea Only    Intestinal upset and diarrhea   Lipitor [Atorvastatin] Other (See Comments)    Myalgia   Xtandi [Enzalutamide]     GI upset    Family History: Family History  Problem Relation Age of Onset   Heart failure Father    Hypertension Father     Social History:  reports that he has never smoked. He has never used smokeless tobacco. He reports current alcohol use. He reports that he does not use drugs.  ROS: All other review of systems were reviewed and are negative except what is noted above in HPI  Physical Exam: BP 137/89   Pulse 85   Constitutional:  Alert  and oriented, No acute distress. HEENT: Bryan Gilbert AT, moist mucus membranes.  Trachea midline, no masses. Cardiovascular: No clubbing, cyanosis, or edema. Respiratory: Normal respiratory effort, no increased work of breathing. GI: Abdomen is soft, nontender, nondistended, no abdominal masses GU: No CVA tenderness.  Lymph: No cervical or inguinal lymphadenopathy. Skin: No rashes, bruises or suspicious lesions. Neurologic: Grossly intact, no focal deficits, moving all 4 extremities. Psychiatric: Normal mood and affect.  Laboratory Data: Lab Results  Component Value Date   WBC 8.9 12/03/2023   HGB 19.6 (H) 12/03/2023   HCT 59.2 (H) 12/03/2023   MCV 92 12/03/2023   PLT 296 12/03/2023    Lab Results  Component Value Date   CREATININE 1.05  12/03/2023    No results found for: "PSA"  Lab Results  Component Value Date   TESTOSTERONE  612 12/03/2023    Lab Results  Component Value Date   HGBA1C 8.2 (H) 08/11/2023    Urinalysis    Component Value Date/Time   COLORURINE YELLOW 04/21/2023 0242   APPEARANCEUR CLEAR 04/21/2023 0242   APPEARANCEUR Clear 12/18/2022 1242   LABSPEC 1.020 04/21/2023 0242   PHURINE 6.0 04/21/2023 0242   GLUCOSEU NEGATIVE 04/21/2023 0242   HGBUR MODERATE (A) 04/21/2023 0242   BILIRUBINUR NEGATIVE 04/21/2023 0242   BILIRUBINUR Negative 12/18/2022 1242   KETONESUR 80 (A) 04/21/2023 0242   PROTEINUR NEGATIVE 04/21/2023 0242   UROBILINOGEN 0.2 05/19/2013 1732   NITRITE NEGATIVE 04/21/2023 0242   LEUKOCYTESUR NEGATIVE 04/21/2023 0242    Lab Results  Component Value Date   LABMICR 37.5 08/11/2023   BACTERIA NONE SEEN 04/21/2023    Pertinent Imaging:  No results found for this or any previous visit.  No results found for this or any previous visit.  No results found for this or any previous visit.  No results found for this or any previous visit.  No results found for this or any previous visit.  No results found for this or any previous visit.  No results found for this or any previous visit.  Results for orders placed during the hospital encounter of 04/18/23  CT Renal Stone Study  Narrative CLINICAL DATA:  Right flank pain, nausea, vomiting  EXAM: CT ABDOMEN AND PELVIS WITHOUT CONTRAST  TECHNIQUE: Multidetector CT imaging of the abdomen and pelvis was performed following the standard protocol without IV contrast.  RADIATION DOSE REDUCTION: This exam was performed according to the departmental dose-optimization program which includes automated exposure control, adjustment of the mA and/or kV according to patient size and/or use of iterative reconstruction technique.  COMPARISON:  09/19/2015  FINDINGS: Lower chest: No acute abnormality  Hepatobiliary: Severe  diffuse fatty infiltration of the liver. Gallbladder unremarkable. No focal hepatic abnormality or biliary ductal dilatation.  Pancreas: Fatty replacement. No focal abnormality or ductal dilatation.  Spleen: No focal abnormality.  Normal size.  Adrenals/Urinary Tract: Mild right hydronephrosis and hydroureter due to 2 mm right UVJ stone. Mild perinephric stranding. Small bilateral nonobstructing renal stones. No hydronephrosis on the left. Adrenal glands and urinary bladder unremarkable.  Stomach/Bowel: Normal appendix. Stomach, large and small bowel grossly unremarkable.  Vascular/Lymphatic: No evidence of aneurysm or adenopathy.  Reproductive: No visible focal abnormality.  Other: No free fluid or free air.  Musculoskeletal: No acute bony abnormality.  IMPRESSION: 2 mm right UVJ stone with mild right hydronephrosis and perinephric stranding.  Bilateral nephrolithiasis.  Severe hepatic steatosis.   Electronically Signed By: Janeece Mechanic M.D. On: 04/18/2023 22:14   Assessment & Plan:  1. 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 Nephrolithiasis -followup 6 months with [facility]   No follow-ups on file.  Johnie Nailer, MD  [facility] [place]

## 2023-12-14 ENCOUNTER — Encounter: Payer: Self-pay | Admitting: Urology

## 2023-12-14 NOTE — Patient Instructions (Signed)
 Hypogonadism, Male  Male hypogonadism is a condition of having a level of testosterone  that is lower than normal. Testosterone  is a chemical, or hormone, that is made mainly in the testicles. In boys, testosterone  is responsible for the development of male characteristics during puberty. These include: Making the penis bigger. Growing and building the muscles. Growing facial hair. Deepening the voice. In adult men, testosterone  is responsible for maintaining: An interest in sex and the ability to have sex. Muscle mass. Sperm production. Red blood cell production. Bone strength. Testosterone  also gives men energy and a sense of well-being. Testosterone  normally decreases as men age and the testicles make less testosterone . Testosterone  levels can vary from man to man. Not all men will have signs and symptoms of low testosterone . Weight, alcohol use, medicines, and certain medical conditions can affect a man's testosterone  level. What are the causes? This condition is caused by: A natural decrease in testosterone  that occurs as a man grows older. This is the main cause of this condition. Use of medicines, such as antidepressants, steroids, and opioids. Diseases and conditions that affect the testicles or the making of testosterone . These include: Injury or damage to the testicles from trauma, cancer, cancer treatment, or infection. Diabetes. Sleep apnea. Genetic conditions that men are born with. Disease of the pituitary gland. This gland is in the brain. It produces hormones. Obesity. Metabolic syndrome. This is a group of diseases that affect blood pressure, blood sugar, cholesterol, and belly fat. HIV or AIDS. Alcohol abuse. Kidney failure. Other long-term or chronic diseases. What are the signs or symptoms? Common symptoms of this condition include: Loss of interest in sex (low sex drive). Inability to have or maintain an erection (erectile dysfunction). Feeling tired  (fatigue). Mood changes, like irritability or depression. Loss of muscle and body hair. Infertility. Large breasts. Weight gain (obesity). How is this diagnosed? Your health care provider can diagnose hypogonadism based on: Your signs and symptoms. A physical exam to check your testosterone  levels. This includes blood tests. Testosterone  levels can change throughout the day. Levels are highest in the morning. You may need to have repeat blood tests before getting a diagnosis of hypogonadism. Depending on your medical history and test results, your health care provider may also do other tests to find the cause of low testosterone . How is this treated? This condition is treated with testosterone  replacement therapy. Testosterone  can be given by: Injection or through pellets inserted under the skin. Gels or patches placed on the skin or in the mouth. Testosterone  therapy is not for everyone. It has risks and side effects. Your health care provider will consider your medical history, your risk for prostate cancer, your age, and your symptoms before putting you on testosterone  replacement therapy. Follow these instructions at home: Take over-the-counter and prescription medicines only as told by your health care provider. Eat foods that are high in fiber, such as beans, whole grains, and fresh fruits and vegetables. Limit foods that are high in fat and processed sugars, such as fried or sweet foods. If you drink alcohol: Limit how much you have to 0-2 drinks a day. Know how much alcohol is in your drink. In the [place] one drink equals one 12 oz bottle of beer (355 mL), one 5 oz glass of wine (148 mL), or one 1 oz glass of hard liquor (44 mL). Return to your normal activities as told by your health care provider. Ask your health care provider what activities are safe for you. Keep all  follow-up visits. This is important. Contact a health care provider if: You have any of the signs or symptoms of  low testosterone . You have any side effects from testosterone  therapy. Summary Male hypogonadism is a condition of having a level of testosterone  that is lower than normal. The natural drop in testosterone  production that occurs with age is the most common cause of this condition. Low testosterone  can also be caused by many diseases and conditions that affect the testicles and the making of testosterone . This condition is treated with testosterone  replacement therapy. There are risks and side effects of testosterone  therapy. Your health care provider will consider your age, medical history, symptoms, and risks for prostate cancer before putting you on testosterone  therapy. This information is not intended to replace advice given to you by your health care provider. Make sure you discuss any questions you have with your health care provider. Document Revised: 05/31/2023 Document Reviewed: 05/31/2023 Elsevier Patient Education  2025 [employer].

## 2024-01-04 ENCOUNTER — Inpatient Hospital Stay (HOSPITAL_COMMUNITY)
Admission: EM | Admit: 2024-01-04 | Discharge: 2024-01-06 | DRG: 853 | Disposition: A | Discharge status: Home / Self Care | Attending: Family Medicine | Admitting: Family Medicine

## 2024-01-04 ENCOUNTER — Emergency Department (HOSPITAL_COMMUNITY)

## 2024-01-04 ENCOUNTER — Encounter (HOSPITAL_COMMUNITY): Payer: Self-pay

## 2024-01-04 ENCOUNTER — Other Ambulatory Visit: Payer: Self-pay

## 2024-01-04 DIAGNOSIS — N136 Pyonephrosis: Secondary | ICD-10-CM | POA: Diagnosis present

## 2024-01-04 DIAGNOSIS — Z8249 Family history of ischemic heart disease and other diseases of the circulatory system: Secondary | ICD-10-CM

## 2024-01-04 DIAGNOSIS — G8929 Other chronic pain: Secondary | ICD-10-CM | POA: Diagnosis present

## 2024-01-04 DIAGNOSIS — G4733 Obstructive sleep apnea (adult) (pediatric): Secondary | ICD-10-CM | POA: Diagnosis present

## 2024-01-04 DIAGNOSIS — G4730 Sleep apnea, unspecified: Secondary | ICD-10-CM | POA: Diagnosis present

## 2024-01-04 DIAGNOSIS — R112 Nausea with vomiting, unspecified: Secondary | ICD-10-CM | POA: Diagnosis not present

## 2024-01-04 DIAGNOSIS — E860 Dehydration: Secondary | ICD-10-CM | POA: Diagnosis present

## 2024-01-04 DIAGNOSIS — N202 Calculus of kidney with calculus of ureter: Secondary | ICD-10-CM | POA: Diagnosis present

## 2024-01-04 DIAGNOSIS — E872 Acidosis, unspecified: Secondary | ICD-10-CM | POA: Diagnosis present

## 2024-01-04 DIAGNOSIS — D72829 Elevated white blood cell count, unspecified: Secondary | ICD-10-CM | POA: Diagnosis not present

## 2024-01-04 DIAGNOSIS — R739 Hyperglycemia, unspecified: Secondary | ICD-10-CM | POA: Diagnosis present

## 2024-01-04 DIAGNOSIS — N200 Calculus of kidney: Principal | ICD-10-CM

## 2024-01-04 DIAGNOSIS — R109 Unspecified abdominal pain: Secondary | ICD-10-CM | POA: Diagnosis not present

## 2024-01-04 DIAGNOSIS — Z791 Long term (current) use of non-steroidal anti-inflammatories (NSAID): Secondary | ICD-10-CM

## 2024-01-04 DIAGNOSIS — A419 Sepsis, unspecified organism: Principal | ICD-10-CM | POA: Diagnosis present

## 2024-01-04 DIAGNOSIS — I10 Essential (primary) hypertension: Secondary | ICD-10-CM | POA: Diagnosis present

## 2024-01-04 DIAGNOSIS — Z87442 Personal history of urinary calculi: Secondary | ICD-10-CM | POA: Diagnosis not present

## 2024-01-04 DIAGNOSIS — G43D0 Abdominal migraine, not intractable: Secondary | ICD-10-CM | POA: Diagnosis present

## 2024-01-04 DIAGNOSIS — E66812 Obesity, class 2: Secondary | ICD-10-CM | POA: Diagnosis present

## 2024-01-04 DIAGNOSIS — Z888 Allergy status to other drugs, medicaments and biological substances status: Secondary | ICD-10-CM

## 2024-01-04 DIAGNOSIS — I959 Hypotension, unspecified: Secondary | ICD-10-CM | POA: Diagnosis not present

## 2024-01-04 DIAGNOSIS — Z743 Need for continuous supervision: Secondary | ICD-10-CM | POA: Diagnosis not present

## 2024-01-04 DIAGNOSIS — N170 Acute kidney failure with tubular necrosis: Secondary | ICD-10-CM | POA: Diagnosis present

## 2024-01-04 DIAGNOSIS — Z79899 Other long term (current) drug therapy: Secondary | ICD-10-CM | POA: Diagnosis not present

## 2024-01-04 DIAGNOSIS — N179 Acute kidney failure, unspecified: Secondary | ICD-10-CM

## 2024-01-04 DIAGNOSIS — E1165 Type 2 diabetes mellitus with hyperglycemia: Secondary | ICD-10-CM | POA: Diagnosis present

## 2024-01-04 DIAGNOSIS — N132 Hydronephrosis with renal and ureteral calculous obstruction: Secondary | ICD-10-CM | POA: Diagnosis not present

## 2024-01-04 DIAGNOSIS — Z6838 Body mass index (BMI) 38.0-38.9, adult: Secondary | ICD-10-CM

## 2024-01-04 HISTORY — DX: Disorder of kidney and ureter, unspecified: N28.9

## 2024-01-04 LAB — CBC WITH DIFFERENTIAL/PLATELET
Abs Immature Granulocytes: 0.13 10*3/uL — ABNORMAL HIGH (ref 0.00–0.07)
Basophils Absolute: 0 10*3/uL (ref 0.0–0.1)
Basophils Relative: 0 %
Eosinophils Absolute: 0 10*3/uL (ref 0.0–0.5)
Eosinophils Relative: 0 %
HCT: 58.1 % — ABNORMAL HIGH (ref 39.0–52.0)
Hemoglobin: 19.9 g/dL — ABNORMAL HIGH (ref 13.0–17.0)
Immature Granulocytes: 1 %
Lymphocytes Relative: 6 %
Lymphs Abs: 1 10*3/uL (ref 0.7–4.0)
MCH: 29.4 pg (ref 26.0–34.0)
MCHC: 34.3 g/dL (ref 30.0–36.0)
MCV: 85.7 fL (ref 80.0–100.0)
Monocytes Absolute: 0.8 10*3/uL (ref 0.1–1.0)
Monocytes Relative: 5 %
Neutro Abs: 13.9 10*3/uL — ABNORMAL HIGH (ref 1.7–7.7)
Neutrophils Relative %: 88 %
Platelets: 331 10*3/uL (ref 150–400)
RBC: 6.78 MIL/uL — ABNORMAL HIGH (ref 4.22–5.81)
RDW: 12.5 % (ref 11.5–15.5)
WBC: 15.9 10*3/uL — ABNORMAL HIGH (ref 4.0–10.5)
nRBC: 0 % (ref 0.0–0.2)

## 2024-01-04 LAB — URINALYSIS, ROUTINE W REFLEX MICROSCOPIC
Bilirubin Urine: NEGATIVE
Glucose, UA: >=500 mg/dL — AB
Ketones, ur: 20 mg/dL — AB
Leukocytes,Ua: NEGATIVE
Nitrite: NEGATIVE
Protein, ur: NEGATIVE mg/dL
Specific Gravity, Urine: 1.022 (ref 1.005–1.030)
pH: 5 (ref 5.0–8.0)

## 2024-01-04 LAB — BASIC METABOLIC PANEL WITH GFR
Anion gap: 14 (ref 5–15)
BUN: 17 mg/dL (ref 6–20)
CO2: 19 mmol/L — ABNORMAL LOW (ref 22–32)
Calcium: 8.2 mg/dL — ABNORMAL LOW (ref 8.9–10.3)
Chloride: 104 mmol/L (ref 98–111)
Creatinine, Ser: 1.24 mg/dL (ref 0.61–1.24)
GFR, Estimated: >60 mL/min (ref >60)
Glucose, Bld: 344 mg/dL — ABNORMAL HIGH (ref 70–99)
Potassium: 4.8 mmol/L (ref 3.5–5.1)
Sodium: 137 mmol/L (ref 135–145)

## 2024-01-04 LAB — COMPREHENSIVE METABOLIC PANEL WITH GFR
ALT: 80 U/L — ABNORMAL HIGH (ref 0–44)
AST: 51 U/L — ABNORMAL HIGH (ref 15–41)
Albumin: 4.4 g/dL (ref 3.5–5.0)
Alkaline Phosphatase: 82 U/L (ref 38–126)
Anion gap: 18 — ABNORMAL HIGH (ref 5–15)
BUN: 17 mg/dL (ref 6–20)
CO2: 18 mmol/L — ABNORMAL LOW (ref 22–32)
Calcium: 9.2 mg/dL (ref 8.9–10.3)
Chloride: 100 mmol/L (ref 98–111)
Creatinine, Ser: 1.35 mg/dL — ABNORMAL HIGH (ref 0.61–1.24)
GFR, Estimated: >60 mL/min (ref >60)
Glucose, Bld: 371 mg/dL — ABNORMAL HIGH (ref 70–99)
Potassium: 4.3 mmol/L (ref 3.5–5.1)
Sodium: 136 mmol/L (ref 135–145)
Total Bilirubin: 1.4 mg/dL — ABNORMAL HIGH (ref 0.0–1.2)
Total Protein: 8.2 g/dL — ABNORMAL HIGH (ref 6.5–8.1)

## 2024-01-04 LAB — LIPASE, BLOOD: Lipase: 22 U/L (ref 11–51)

## 2024-01-04 LAB — CBG MONITORING, ED
Glucose-Capillary: 345 mg/dL — ABNORMAL HIGH (ref 70–99)
Glucose-Capillary: 327 mg/dL — ABNORMAL HIGH (ref 70–99)

## 2024-01-04 MED ORDER — FENTANYL CITRATE PF 50 MCG/ML IJ SOSY
50.0000 ug | PREFILLED_SYRINGE | Freq: Once | INTRAMUSCULAR | Status: AC
  Administered 2024-01-04: 50 ug via INTRAVENOUS
  Filled 2024-01-04: qty 1

## 2024-01-04 MED ORDER — INSULIN ASPART 100 UNIT/ML IJ SOLN
10.0000 [IU] | Freq: Once | INTRAMUSCULAR | Status: AC
  Administered 2024-01-04: 10 [IU] via SUBCUTANEOUS
  Filled 2024-01-04: qty 1

## 2024-01-04 MED ORDER — LORAZEPAM 2 MG/ML IJ SOLN
1.0000 mg | Freq: Once | INTRAMUSCULAR | Status: AC
Start: 2024-01-04 — End: 2024-01-04
  Administered 2024-01-04: 1 mg via INTRAVENOUS
  Filled 2024-01-04: qty 1

## 2024-01-04 MED ORDER — LISINOPRIL 10 MG PO TABS: 10.0000 mg | ORAL_TABLET | Freq: Every day | ORAL | Status: DC

## 2024-01-04 MED ORDER — PROCHLORPERAZINE EDISYLATE 10 MG/2ML IJ SOLN
10.0000 mg | Freq: Once | INTRAMUSCULAR | Status: AC
  Administered 2024-01-04: 10 mg via INTRAVENOUS
  Filled 2024-01-04: qty 2

## 2024-01-04 MED ORDER — HYDROMORPHONE HCL 1 MG/ML IJ SOLN
0.5000 mg | Freq: Once | INTRAMUSCULAR | Status: AC
  Administered 2024-01-04: 0.5 mg via INTRAVENOUS
  Filled 2024-01-04: qty 0.5

## 2024-01-04 MED ORDER — PANTOPRAZOLE SODIUM 40 MG IV SOLR
40.0000 mg | Freq: Once | INTRAVENOUS | Status: AC
  Administered 2024-01-04: 40 mg via INTRAVENOUS
  Filled 2024-01-04: qty 10

## 2024-01-04 MED ORDER — ACETAMINOPHEN 650 MG RE SUPP: 650.0000 mg | Freq: Four times a day (QID) | RECTAL | Status: DC | PRN

## 2024-01-04 MED ORDER — SODIUM CHLORIDE 0.9 % IV SOLN
2.0000 g | Freq: Once | INTRAVENOUS | Status: AC
  Administered 2024-01-04: 2 g via INTRAVENOUS
  Filled 2024-01-04: qty 20

## 2024-01-04 MED ORDER — HYDROMORPHONE HCL 1 MG/ML IJ SOLN
0.5000 mg | INTRAMUSCULAR | Status: DC | PRN
  Administered 2024-01-04 – 2024-01-05 (×6): 0.5 mg via INTRAVENOUS
  Filled 2024-01-04: qty 1
  Filled 2024-01-04 (×2): qty 0.5
  Filled 2024-01-04: qty 1
  Filled 2024-01-04: qty 0.5
  Filled 2024-01-04: qty 1
  Filled 2024-01-04: qty 0.5

## 2024-01-04 MED ORDER — SODIUM CHLORIDE 0.9 % IV BOLUS
1000.0000 mL | Freq: Once | INTRAVENOUS | Status: AC
  Administered 2024-01-04: 1000 mL via INTRAVENOUS

## 2024-01-04 MED ORDER — ONDANSETRON HCL 4 MG/2ML IJ SOLN
4.0000 mg | Freq: Four times a day (QID) | INTRAMUSCULAR | Status: DC | PRN
Start: 2024-01-04 — End: 2024-01-06
  Administered 2024-01-05 (×2): 4 mg via INTRAVENOUS
  Filled 2024-01-04 (×2): qty 2

## 2024-01-04 MED ORDER — ONDANSETRON 4 MG PO TBDP
4.0000 mg | ORAL_TABLET | Freq: Once | ORAL | Status: AC | PRN
  Administered 2024-01-04: 4 mg via ORAL
  Filled 2024-01-04: qty 1

## 2024-01-04 MED ORDER — ONDANSETRON HCL 4 MG/2ML IJ SOLN
4.0000 mg | Freq: Once | INTRAMUSCULAR | Status: AC
  Administered 2024-01-04: 4 mg via INTRAVENOUS
  Filled 2024-01-04: qty 2

## 2024-01-04 MED ORDER — ACETAMINOPHEN 325 MG PO TABS: 650.0000 mg | ORAL_TABLET | Freq: Four times a day (QID) | ORAL | Status: DC | PRN

## 2024-01-04 MED ORDER — PANTOPRAZOLE SODIUM 40 MG IV SOLR
40.0000 mg | Freq: Every day | INTRAVENOUS | Status: DC
  Administered 2024-01-05 – 2024-01-06 (×2): 40 mg via INTRAVENOUS
  Filled 2024-01-04 (×2): qty 10

## 2024-01-04 MED ORDER — ONDANSETRON HCL 4 MG PO TABS: 4.0000 mg | ORAL_TABLET | Freq: Four times a day (QID) | ORAL | Status: DC | PRN

## 2024-01-04 NOTE — ED Provider Notes (Signed)
  EMERGENCY DEPARTMENT AT [facility] Provider Note   CSN: [id number] Arrival date & time: 01/04/24  1242     Patient presents with: Flank Pain   Bryan Gilbert is a 58 y.o. male.    Flank Pain Pertinent negatives include no chest pain, no abdominal pain and no shortness of breath.        Bryan Gilbert is a 58 y.o. male past medical history of hypertension, abdominal migraines, cyclic vomiting, who presents to the Emergency Department complaining of right flank pain, nausea, vomiting and dry heaving.  Symptoms began this morning.  He endorses history of kidney stones, seen here October of last year with similar symptoms.  He takes opiate pain medications daily at baseline no relief with his routine medications.  States his nausea has been severe unable to tolerate any liquids or food today.  Urine output has been decreased today, but denies any pain with urination fever or chills.    Prior to Admission medications   Medication Sig Start Date End Date Taking? Authorizing Provider  butorphanol  (STADOL ) 10 MG/ML nasal spray Place 1 spray into the nose every 4 (four) hours as needed for headache or migraine.  07/28/17   [provider]  cyclobenzaprine  (FLEXERIL ) 10 MG tablet Take 10 mg by mouth 3 (three) times daily as needed for muscle spasms.     [provider]  levorphanol (LEVODROMORAN) 2 MG tablet  06/30/20   [provider]  lisinopril -hydrochlorothiazide  (ZESTORETIC ) 10-12.5 MG tablet Take 1 tablet by mouth daily. 10/11/23   Alphonsa Glendia LABOR, MD  loperamide  (IMODIUM ) 2 MG capsule Take 2 mg by mouth as needed for diarrhea or loose stools.    [provider]  meloxicam  (MOBIC ) 15 MG tablet TAKE 1 TABLET (15 MG TOTAL) BY MOUTH DAILY. 12/08/23   Alphonsa Glendia LABOR, MD  SALINE NASAL SPRAY NA Place 1 spray into the nose daily as needed (dryness). Reported on 09/17/2015    [provider]  sertraline  (ZOLOFT ) 50 MG tablet TAKE 1  TABLET BY MOUTH EVERY DAY 10/12/23   Alphonsa Glendia LABOR, MD  simethicone  (MYLICON) 80 MG chewable tablet Chew 80 mg by mouth every 6 (six) hours as needed for flatulence.    [provider]  tadalafil  (CIALIS ) 20 MG tablet Take 0.5-1 tablets (10-20 mg total) by mouth every other day as needed for erectile dysfunction. 02/03/23   Alphonsa Glendia LABOR, MD  Testosterone  20.25 MG/ACT (1.62%) GEL APPLY 3 PUMP TOPICALLY DAILY. 11/02/23   McKenzie, Belvie CROME, MD  Testosterone  Undecanoate (JATENZO ) 237 MG CAPS Take 1 capsule (237 mg total) by mouth 2 (two) times daily. 12/10/23   McKenzie, Belvie CROME, MD    Allergies: Cefprozil , Lipitor [atorvastatin], and Xtandi [enzalutamide]    Review of Systems  Constitutional:  Positive for appetite change. Negative for chills and fever.  HENT:  Negative for sore throat and trouble swallowing.   Respiratory:  Negative for shortness of breath.   Cardiovascular:  Negative for chest pain.  Gastrointestinal:  Positive for nausea and vomiting. Negative for abdominal pain and diarrhea.  Genitourinary:  Positive for decreased urine volume and flank pain.    Updated Vital Signs BP (!) 159/97   Pulse 89   Temp 99.9 F (37.7 C) (Oral)   Resp 14   Ht 5' 7 (1.702 m)   Wt 112.9 kg   SpO2 90%   BMI 38.98 kg/m   Physical Exam Vitals reviewed.  Constitutional:  General: He is not in acute distress.    Comments: Pt sitting up on the stretcher, appears uncomfortable.    HENT:     Mouth/Throat:     Mouth: Mucous membranes are dry.  Cardiovascular:     Rate and Rhythm: Normal rate and regular rhythm.  Pulmonary:     Effort: Pulmonary effort is normal.  Abdominal:     Palpations: Abdomen is soft.     Tenderness: There is no right CVA tenderness or left CVA tenderness.  Musculoskeletal:        General: Normal range of motion.  Skin:    General: Skin is warm.     Capillary Refill: Capillary refill takes less than 2 seconds.  Neurological:     General: No  focal deficit present.     Mental Status: He is alert.     Sensory: No sensory deficit.     Motor: No weakness.     (all labs ordered are listed, but only abnormal results are displayed) Labs Reviewed  COMPREHENSIVE METABOLIC PANEL WITH GFR - Abnormal; Notable for the following components:      Result Value   CO2 18 (*)    Glucose, Bld 371 (*)    Creatinine, Ser 1.35 (*)    Total Protein 8.2 (*)    AST 51 (*)    ALT 80 (*)    Total Bilirubin 1.4 (*)    Anion gap 18 (*)    All other components within normal limits  URINALYSIS, ROUTINE W REFLEX MICROSCOPIC - Abnormal; Notable for the following components:   APPearance HAZY (*)    Glucose, UA >=500 (*)    Hgb urine dipstick LARGE (*)    Ketones, ur 20 (*)    Bacteria, UA RARE (*)    All other components within normal limits  CBC WITH DIFFERENTIAL/PLATELET - Abnormal; Notable for the following components:   WBC 15.9 (*)    RBC 6.78 (*)    Hemoglobin 19.9 (*)    HCT 58.1 (*)    Neutro Abs 13.9 (*)    Abs Immature Granulocytes 0.13 (*)    All other components within normal limits  BASIC METABOLIC PANEL WITH GFR - Abnormal; Notable for the following components:   CO2 19 (*)    Glucose, Bld 344 (*)    Calcium  8.2 (*)    All other components within normal limits  CBG MONITORING, ED - Abnormal; Notable for the following components:   Glucose-Capillary 345 (*)    All other components within normal limits  CBG MONITORING, ED - Abnormal; Notable for the following components:   Glucose-Capillary 327 (*)    All other components within normal limits  LIPASE, BLOOD    EKG: None  Radiology: CT Renal Stone Study Result Date: 01/04/2024 CLINICAL DATA:  Abdominal/flank pain, stone suspected right flank pain EXAM: CT ABDOMEN AND PELVIS WITHOUT CONTRAST TECHNIQUE: Multidetector CT imaging of the abdomen and pelvis was performed following the standard protocol without IV contrast. RADIATION DOSE REDUCTION: This exam was performed  according to the departmental dose-optimization program which includes automated exposure control, adjustment of the mA and/or kV according to patient size and/or use of iterative reconstruction technique. COMPARISON:  CT 04/18/2023 FINDINGS: Lower chest: Clear lung bases. Hepatobiliary: The liver is enlarged spanning 19.3 cm cranial caudal with diffuse steatosis. No evidence of focal liver abnormality on this unenhanced exam. The gallbladder is partially distended. No calcified gallstone or pericholecystic inflammation. Stable biliary tree. Pancreas: Fatty atrophy.  No  ductal dilatation or inflammation. Spleen: No splenomegaly.  No focal abnormality. Adrenals/Urinary Tract: No adrenal nodule. Obstructing 4 x 6 mm stone in the mid right ureter (at the L4 level) with mild proximal hydroureteronephrosis and perinephric stranding. Simple cyst in the upper right kidney. No further follow-up imaging is recommended. Two 5 mm nonobstructing stones in the left kidney. No left hydronephrosis. Unremarkable urinary bladder. Stomach/Bowel: No bowel obstruction or inflammation. Moderate colonic stool burden. Normal appendix. Tiny hiatal hernia. Vascular/Lymphatic: Normal caliber abdominal aorta. Shotty periportal and portal caval nodes are likely reactive. No enlarged lymph nodes in the abdomen or pelvis. Reproductive: Prostate is unremarkable. Other: No free air, free fluid, or intra-abdominal fluid collection. Musculoskeletal: There are no acute or suspicious osseous abnormalities. L5-S1 degenerative disc disease IMPRESSION: 1. Obstructing 4 x 6 mm stone in the mid right ureter with mild proximal hydroureteronephrosis and perinephric stranding. 2. Nonobstructing left nephrolithiasis. 3. Hepatomegaly with hepatic steatosis. Electronically Signed   By: Andrea Gasman M.D.   On: 01/04/2024 16:48     Procedures   Medications Ordered in the ED  LORazepam  (ATIVAN ) injection 1 mg (has no administration in time range)   ondansetron  (ZOFRAN -ODT) disintegrating tablet 4 mg (4 mg Oral Given 01/04/24 1315)  sodium chloride  0.9 % bolus 1,000 mL (1,000 mLs Intravenous Bolus 01/04/24 1439)  fentaNYL  (SUBLIMAZE ) injection 50 mcg (50 mcg Intravenous Given 01/04/24 1440)  ondansetron  (ZOFRAN ) injection 4 mg (4 mg Intravenous Given 01/04/24 1440)  sodium chloride  0.9 % bolus 1,000 mL (0 mLs Intravenous Stopped 01/04/24 1605)  fentaNYL  (SUBLIMAZE ) injection 50 mcg (50 mcg Intravenous Given 01/04/24 1520)  insulin aspart (novoLOG) injection 10 Units (10 Units Subcutaneous Given 01/04/24 1554)  HYDROmorphone  (DILAUDID ) injection 0.5 mg (0.5 mg Intravenous Given 01/04/24 1601)  prochlorperazine  (COMPAZINE ) injection 10 mg (10 mg Intravenous Given 01/04/24 1558)  pantoprazole  (PROTONIX ) injection 40 mg (40 mg Intravenous Given 01/04/24 1556)  HYDROmorphone  (DILAUDID ) injection 0.5 mg (0.5 mg Intravenous Given 01/04/24 1711)  insulin aspart (novoLOG) injection 10 Units (10 Units Subcutaneous Given 01/04/24 1901)                                    Medical Decision Making Patient with history of kidney stones followed by local urology here this evening with right-sided flank pain onset earlier today.  Endorses nausea some vomiting and dry heaves no fever or chills.  No relief at home of his flank pain with his prescribed opiate medications.   Suspect flank pain secondary to kidney stone, pyelonephritis also considered.  Doubt acute surgical abdomen.  Amount and/or Complexity of Data Reviewed Labs: ordered.    Details: Labs show el leukocytosis white count 16,000, felt to be secondary to vomiting dry heaving, chemistries show hyperglycemia, bicarb is low and anion gap is elevated.  Feel that this is secondary to dehydration  Radiology: ordered.    Details: CT scan shows an obstructing 4 x 6 mm stone in the mid right ureter with proximal hydroureteronephrosis Discussion of management or test interpretation with external provider(s): Patient  initially appears dry, elevated anion gap and hyperglycemic, after IV fluids gap is now closed and creatinine improved.  He has required multiple doses of IV pain medication, continues to endorse some level of pain.  Blood pressure remains reassuring.  On review of medical records, patient noted to be prediabetic by PCP has not been on medication for diabetes.  He has required IV fluids  and insulin here, blood sugar is improving   Discussed findings with urology, Dr. Carolee.  He asked that patient be transferred to [facility] for plan of stent placement tomorrow morning.  Patient to be n.p.o. after midnight, IV Rocephin given here. hospitalist to admit for management of patient's elevated blood sugar and arrange the transfer  Per report from nursing, no admit beds available at [facility] this evening, so will arrange for ED to ED transfer.  I spoke with ED physician Dr. Laurice who accepts.    Risk Prescription drug management. Decision regarding hospitalization.        Final diagnoses:  Nephrolithiasis  Hyperglycemia    ED Discharge Orders     None          Herlinda Milling, PA-C 01/04/24 2245    Bernard Drivers, MD 01/05/24 [phone]

## 2024-01-04 NOTE — ED Notes (Signed)
 While asking Pt to have a seat in the waiting area following Triage, Pt gagged himself and spit out the ODT Zofran  into his emesis basin.

## 2024-01-04 NOTE — H&P (Incomplete)
 History and Physical    Patient: Bryan Gilbert DOB: Jul 31, 1965 DOA: 01/04/2024 DOS: the patient was seen and examined on 01/04/2024 PCP: Bryan Gilbert LABOR, Gilbert  Patient coming from: Home  Chief Complaint:  Chief Complaint  Patient presents with   Flank Pain   HPI: Bryan Gilbert is a 58 y.o. male with medical history significant of OSA on CPAP, abdominal migraine, hypertension, chronic back pain and history of kidney stone who presents to the emergency department due to right flank pain which started this morning around 6:30 AM, pain was rated as 8/10 on pain scale.  He also complained of dizziness and nausea as well as difficulty in being able to urinate.  ED Course:  In the emergency department, patient was tachycardic, BP was 171/115, other vital signs were within normal range.  Workup in the ED showed WBC of 15.9, hemoglobin 19.9, hematocrit 50.1, MCV 85.7, platelets 331.  BMP was normal except for bicarb of 18, blood glucose 371, BUN 17, creatinine 1.35, AST 51, ALT 80, ALP 82.  Urinalysis was positive for glycosuria and hematuria CT abdomen and pelvis without contrast showed obstructing 4 x 6 mm stone in the mid right ureter with mild proximal hydroureteronephrosis and perinephric stranding. He was treated with IV Dilaudid , IV fentanyl , Bryan , Compazine , Protonix .  IV NS 2L was provided. Urologist (Dr. Carolee) was consulted and recommended admitting patient to [facility] with plan to possibly place a stent in the morning.  IV ceftriaxone was given.  [facility] was asked to admit patient  Review of Systems: Review of systems as noted in the HPI. All other systems reviewed and are negative.   Past Medical History:  Diagnosis Date   Abdominal migraine    Cervical neuralgia    Cyclical vomiting    Elevated hemoglobin (HCC)    Hypertension    Renal disorder    kidney stone   Sleep apnea    states uses O2 and CPAP at night   Slipped intervertebral disc    Past Surgical History:   Procedure Laterality Date   ESOPHAGOGASTRODUODENOSCOPY      Social History:  reports that he has never smoked. He has never used smokeless tobacco. He reports current alcohol use. He reports that he does not use drugs.   Allergies  Allergen Reactions   Cefprozil  Diarrhea and Nausea Only    Intestinal upset and diarrhea   Lipitor [Atorvastatin] Other (See Comments)    Myalgia   Xtandi [Enzalutamide] Other (See Comments)    GI upset    Family History  Problem Relation Age of Onset   Heart failure Father    Hypertension Father      Prior to Admission medications   Medication Sig Start Date End Date Taking? Authorizing Provider  butorphanol  (STADOL ) 10 MG/ML nasal spray Place 1 spray into the nose every 4 (four) hours as needed for headache or migraine.  07/28/17  Yes Bryan Gilbert  cyclobenzaprine  (FLEXERIL ) 10 MG tablet Take 10 mg by mouth 3 (three) times daily as needed for muscle spasms.    Yes Bryan Gilbert  HYDROcodone -acetaminophen  (NORCO) 10-325 MG tablet Take 1 tablet by mouth 4 (four) times daily as needed for moderate pain (pain score 4-6). 12/11/23  Yes Bryan Gilbert  levorphanol (LEVODROMORAN) 2 MG tablet Take 3 mg by mouth every 6 (six) hours as needed for pain. 06/30/20  Yes Bryan Gilbert  lisinopril -hydrochlorothiazide  (ZESTORETIC ) 10-12.5 MG tablet Take 1 tablet by mouth daily. 10/11/23  Yes Bryan Gilbert LABOR, Gilbert  loperamide  (IMODIUM ) 2 MG capsule Take 2 mg by mouth as needed for diarrhea or loose stools.   Yes Bryan Gilbert  meloxicam  (MOBIC ) 15 MG tablet TAKE 1 TABLET (15 MG TOTAL) BY MOUTH DAILY. 12/08/23  Yes Bryan Gilbert, Gilbert LABOR, Gilbert  ondansetron  (Bryan [facility]) 8 MG disintegrating tablet Take 8 mg by mouth every 8 (eight) hours as needed for nausea or vomiting.   Yes Bryan Gilbert  pseudoephedrine (SUDAFED) 120 MG 12 hr tablet Take 120 mg by mouth daily as needed for congestion.   Yes Bryan Gilbert   QUEtiapine  (SEROQUEL ) 25 MG tablet Take 25 mg by mouth every evening.   Yes Bryan Gilbert  SALINE NASAL SPRAY NA Place 1 spray into the nose daily as needed (dryness). Reported on 09/17/2015   Yes Bryan Gilbert  sertraline  (ZOLOFT ) 50 MG tablet TAKE 1 TABLET BY MOUTH EVERY DAY 10/12/23  Yes Bryan Gilbert LABOR, Gilbert  simethicone  (MYLICON) 80 MG chewable tablet Chew 80 mg by mouth every 6 (six) hours as needed for flatulence.   Yes Bryan Gilbert  tamsulosin  (FLOMAX ) 0.4 MG CAPS capsule Take 0.4 mg by mouth daily after supper.   Yes Bryan Gilbert  Testosterone  20.25 MG/ACT (1.62%) GEL APPLY 3 PUMP TOPICALLY DAILY. 11/02/23  Yes Bryan Gilbert, Bryan CROME, Gilbert  Testosterone  Undecanoate (JATENZO ) 237 MG CAPS Take 1 capsule (237 mg total) by mouth 2 (two) times daily. 12/10/23  Yes Bryan Gilbert, Bryan CROME, Gilbert  vitamin B-12 (CYANOCOBALAMIN) 100 MCG tablet Take 100 mcg by mouth daily.   Yes Bryan Gilbert    Physical Exam: BP 124/73   Pulse (!) 115   Temp 99.9 F (37.7 C) (Oral)   Resp 16   Ht 5' 7 (1.702 m)   Wt 112.9 kg   SpO2 93%   BMI 38.98 kg/m   General: 58 y.o. year-old male somnolent, but easily arousable and in no acute distress. HEENT: NCAT, EOMI Neck: Supple, trachea medial, dry mucous membrane Cardiovascular: Regular rate and rhythm with no rubs or gallops.  No thyromegaly or JVD noted.  No lower extremity edema. 2/4 pulses in all 4 extremities. Respiratory: Clear to auscultation with no wheezes or rales. Good inspiratory effort. Abdomen: Soft, nontender nondistended with normal bowel sounds x4 quadrants. Muskuloskeletal: No cyanosis, clubbing or edema noted bilaterally Neuro: CN II-XII intact, strength 5/5 x 4, sensation, reflexes intact Skin: No ulcerative lesions noted or rashes Psychiatry: Judgement and insight appear normal. Mood is appropriate for condition and setting          Labs on Admission:  Basic Metabolic Panel: Recent Labs   Lab 01/04/24 1352 01/04/24 1609  NA 136 137  K 4.3 4.8  CL 100 104  CO2 18* 19*  GLUCOSE 371* 344*  BUN 17 17  CREATININE 1.35* 1.24  CALCIUM  9.2 8.2*   Liver Function Tests: Recent Labs  Lab 01/04/24 1352  AST 51*  ALT 80*  ALKPHOS 82  BILITOT 1.4*  PROT 8.2*  ALBUMIN 4.4   Recent Labs  Lab 01/04/24 1352  LIPASE 22   No results for input(s): AMMONIA in the last 168 hours. CBC: Recent Labs  Lab 01/04/24 1352  WBC 15.9*  NEUTROABS 13.9*  HGB 19.9*  HCT 58.1*  MCV 85.7  PLT 331   Cardiac Enzymes: No results for input(s): CKTOTAL, CKMB, CKMBINDEX, TROPONINI in the last 168 hours.  BNP (last 3 results) No results for input(s): BNP in the last  8760 hours.  ProBNP (last 3 results) No results for input(s): PROBNP in the last 8760 hours.  CBG: Recent Labs  Lab 01/04/24 1550 01/04/24 1827  GLUCAP 345* 327*    Radiological Exams on Admission: CT Renal Stone Study Result Date: 01/04/2024 CLINICAL DATA:  Abdominal/flank pain, stone suspected right flank pain EXAM: CT ABDOMEN AND PELVIS WITHOUT CONTRAST TECHNIQUE: Multidetector CT imaging of the abdomen and pelvis was performed following the standard protocol without IV contrast. RADIATION DOSE REDUCTION: This exam was performed according to the departmental dose-optimization program which includes automated exposure control, adjustment of the mA and/or kV according to patient size and/or use of iterative reconstruction technique. COMPARISON:  CT 04/18/2023 FINDINGS: Lower chest: Clear lung bases. Hepatobiliary: The liver is enlarged spanning 19.3 cm cranial caudal with diffuse steatosis. No evidence of focal liver abnormality on this unenhanced exam. The gallbladder is partially distended. No calcified gallstone or pericholecystic inflammation. Stable biliary tree. Pancreas: Fatty atrophy.  No ductal dilatation or inflammation. Spleen: No splenomegaly.  No focal abnormality. Adrenals/Urinary Tract: No  adrenal nodule. Obstructing 4 x 6 mm stone in the mid right ureter (at the L4 level) with mild proximal hydroureteronephrosis and perinephric stranding. Simple cyst in the upper right kidney. No further follow-up imaging is recommended. Two 5 mm nonobstructing stones in the left kidney. No left hydronephrosis. Unremarkable urinary bladder. Stomach/Bowel: No bowel obstruction or inflammation. Moderate colonic stool burden. Normal appendix. Tiny hiatal hernia. Vascular/Lymphatic: Normal caliber abdominal aorta. Shotty periportal and portal caval nodes are likely reactive. No enlarged lymph nodes in the abdomen or pelvis. Reproductive: Prostate is unremarkable. Other: No free air, free fluid, or intra-abdominal fluid collection. Musculoskeletal: There are no acute or suspicious osseous abnormalities. L5-S1 degenerative disc disease IMPRESSION: 1. Obstructing 4 x 6 mm stone in the mid right ureter with mild proximal hydroureteronephrosis and perinephric stranding. 2. Nonobstructing left nephrolithiasis. 3. Hepatomegaly with hepatic steatosis. Electronically Signed   By: Andrea Gasman M.D.   On: 01/04/2024 16:48    EKG: I independently viewed the EKG done and my findings are as followed: EKG was not done in the ED  Assessment/Plan Present on Admission:  Abdominal migraine  Essential hypertension  Chronic back pain  Principal Problem:   Nephrolithiasis Active Problems:   Essential hypertension   Abdominal migraine   Chronic back pain   Leukocytosis   AKI (acute kidney injury) (HCC)   Dehydration   Nausea and vomiting  Nephrolithiasis CT abdomen and pelvis indicative of kidney stones Patient presents with right flank pain Continue IV Dilaudid  0.5 mg every 3 hours as needed for moderate/severe pain IV ceftriaxone x 1 was given in the ED Urologist (Dr. Carolee) consulted and recommended admitting patient to [facility] with plan to possibly place a stent on patient in the  morning.  Leukocytosis/hemoconcentration WBC 15.9, hemoglobin 18.9, hematocrit 58.1 This may be related to patient's dehydration, provide gentle hydration Continue to monitor CBC with morning labs  Acute kidney injury Dehydration Creatinine 1.35 (baseline creatinine 0.8-1.1) Continue gentle hydration Renally adjust medications, avoid nephrotoxic agents/dehydration/hypotension  Nausea and vomiting History of abdominal migraine Continue Bryan  as needed  OSA on CPAP Continue CPAP  Essential hypertension Continue lisinopril   Chronic back pain Continue Dilaudid  as needed   DVT prophylaxis: SCDs  Code Status: Full code  Family Communication: Ex-wife at bedside (all questions answered to satisfaction)  Consults: Urology (by [facility] EDP)  Severity of Illness: The appropriate patient status for this patient is INPATIENT. Inpatient status is judged to  be reasonable and necessary in order to provide the required intensity of service to ensure the patient's safety. The patient's presenting symptoms, physical exam findings, and initial radiographic and laboratory data in the context of their chronic comorbidities is felt to place them at high risk for further clinical deterioration. Furthermore, it is not anticipated that the patient will be medically stable for discharge from the hospital within 2 midnights of admission.   * I certify that at the point of admission it is my clinical judgment that the patient will require inpatient hospital care spanning beyond 2 midnights from the point of admission due to high intensity of service, high risk for further deterioration and high frequency of surveillance required.*  Author: Kyonna Frier, DO 01/04/2024 10:08 PM  For on call review [email or web address].

## 2024-01-04 NOTE — ED Triage Notes (Signed)
 Pt arrived via POV c/o dizziness, nausea and severe right flank pain that began this morning. Pt also reports he has been unable to micturate since this morning. Pt endorses Hx of a kidney stone.

## 2024-01-04 NOTE — ED Notes (Signed)
 The patient reports he is unable to go to CT until his pain is under control.

## 2024-01-05 ENCOUNTER — Encounter (HOSPITAL_COMMUNITY): Payer: Self-pay | Admitting: Internal Medicine

## 2024-01-05 ENCOUNTER — Inpatient Hospital Stay (HOSPITAL_COMMUNITY): Admitting: Anesthesiology

## 2024-01-05 ENCOUNTER — Encounter (HOSPITAL_COMMUNITY)
Admission: EM | Disposition: A | Discharge status: Home / Self Care | Payer: Self-pay | Source: Home / Self Care | Attending: Family Medicine

## 2024-01-05 ENCOUNTER — Inpatient Hospital Stay (HOSPITAL_COMMUNITY)

## 2024-01-05 DIAGNOSIS — N200 Calculus of kidney: Secondary | ICD-10-CM | POA: Diagnosis not present

## 2024-01-05 DIAGNOSIS — N132 Hydronephrosis with renal and ureteral calculous obstruction: Secondary | ICD-10-CM | POA: Diagnosis not present

## 2024-01-05 HISTORY — PX: CYSTOSCOPY W/ URETERAL STENT PLACEMENT: SHX1429

## 2024-01-05 LAB — DIFFERENTIAL
Abs Immature Granulocytes: 0.08 10*3/uL — ABNORMAL HIGH (ref 0.00–0.07)
Basophils Absolute: 0 10*3/uL (ref 0.0–0.1)
Basophils Relative: 0 %
Eosinophils Absolute: 0 10*3/uL (ref 0.0–0.5)
Eosinophils Relative: 0 %
Immature Granulocytes: 1 %
Lymphocytes Relative: 19 %
Lymphs Abs: 2.2 10*3/uL (ref 0.7–4.0)
Monocytes Absolute: 0.6 10*3/uL (ref 0.1–1.0)
Monocytes Relative: 5 %
Neutro Abs: 8.8 10*3/uL — ABNORMAL HIGH (ref 1.7–7.7)
Neutrophils Relative %: 75 %

## 2024-01-05 LAB — CBC WITH DIFFERENTIAL/PLATELET

## 2024-01-05 LAB — CBC
HCT: 54 % — ABNORMAL HIGH (ref 39.0–52.0)
Hemoglobin: 18 g/dL — ABNORMAL HIGH (ref 13.0–17.0)
MCH: 30 pg (ref 26.0–34.0)
MCHC: 33.3 g/dL (ref 30.0–36.0)
MCV: 90 fL (ref 80.0–100.0)
Platelets: 251 10*3/uL (ref 150–400)
RBC: 6 MIL/uL — ABNORMAL HIGH (ref 4.22–5.81)
RDW: 12.7 % (ref 11.5–15.5)
WBC: 11.6 10*3/uL — ABNORMAL HIGH (ref 4.0–10.5)
nRBC: 0 % (ref 0.0–0.2)

## 2024-01-05 LAB — COMPREHENSIVE METABOLIC PANEL WITH GFR
ALT: 53 U/L — ABNORMAL HIGH (ref 0–44)
AST: 32 U/L (ref 15–41)
Albumin: 3.7 g/dL (ref 3.5–5.0)
Alkaline Phosphatase: 65 U/L (ref 38–126)
Anion gap: 12 (ref 5–15)
BUN: 24 mg/dL — ABNORMAL HIGH (ref 6–20)
CO2: 21 mmol/L — ABNORMAL LOW (ref 22–32)
Calcium: 8.6 mg/dL — ABNORMAL LOW (ref 8.9–10.3)
Chloride: 106 mmol/L (ref 98–111)
Creatinine, Ser: 0.86 mg/dL (ref 0.61–1.24)
GFR, Estimated: >60 mL/min (ref >60)
Glucose, Bld: 166 mg/dL — ABNORMAL HIGH (ref 70–99)
Potassium: 4.1 mmol/L (ref 3.5–5.1)
Sodium: 139 mmol/L (ref 135–145)
Total Bilirubin: 1.5 mg/dL — ABNORMAL HIGH (ref 0.0–1.2)
Total Protein: 7.2 g/dL (ref 6.5–8.1)

## 2024-01-05 LAB — BASIC METABOLIC PANEL WITH GFR
Anion gap: 13 (ref 5–15)
BUN: 23 mg/dL — ABNORMAL HIGH (ref 6–20)
CO2: 23 mmol/L (ref 22–32)
Calcium: 9.1 mg/dL (ref 8.9–10.3)
Chloride: 103 mmol/L (ref 98–111)
Creatinine, Ser: 1.01 mg/dL (ref 0.61–1.24)
GFR, Estimated: >60 mL/min (ref >60)
Glucose, Bld: 328 mg/dL — ABNORMAL HIGH (ref 70–99)
Potassium: 3.9 mmol/L (ref 3.5–5.1)
Sodium: 139 mmol/L (ref 135–145)

## 2024-01-05 LAB — HEMOGLOBIN A1C
Hgb A1c MFr Bld: 9.4 % — ABNORMAL HIGH (ref 4.8–5.6)
Mean Plasma Glucose: 223.08 mg/dL

## 2024-01-05 LAB — GLUCOSE, CAPILLARY
Glucose-Capillary: 217 mg/dL — ABNORMAL HIGH (ref 70–99)
Glucose-Capillary: 228 mg/dL — ABNORMAL HIGH (ref 70–99)
Glucose-Capillary: 374 mg/dL — ABNORMAL HIGH (ref 70–99)
Glucose-Capillary: 196 mg/dL — ABNORMAL HIGH (ref 70–99)

## 2024-01-05 LAB — HIV ANTIBODY (ROUTINE TESTING W REFLEX): HIV Screen 4th Generation wRfx: NONREACTIVE

## 2024-01-05 LAB — MAGNESIUM: Magnesium: 2.1 mg/dL (ref 1.7–2.4)

## 2024-01-05 LAB — PHOSPHORUS: Phosphorus: 1.8 mg/dL — ABNORMAL LOW (ref 2.5–4.6)

## 2024-01-05 LAB — LACTIC ACID, PLASMA: Lactic Acid, Venous: 1.3 mmol/L (ref 0.5–1.9)

## 2024-01-05 SURGERY — CYSTOSCOPY, WITH RETROGRADE PYELOGRAM AND URETERAL STENT INSERTION
Anesthesia: General | Laterality: Right

## 2024-01-05 MED ORDER — PROPOFOL 10 MG/ML IV BOLUS
INTRAVENOUS | Status: AC
  Filled 2024-01-05: qty 20

## 2024-01-05 MED ORDER — MAGNESIUM SULFATE 2 GM/50ML IV SOLN
2.0000 g | Freq: Once | INTRAVENOUS | Status: AC
  Administered 2024-01-06: 2 g via INTRAVENOUS
  Filled 2024-01-05: qty 50

## 2024-01-05 MED ORDER — SODIUM CHLORIDE 0.9 % IV SOLN
2.0000 g | INTRAVENOUS | Status: DC
  Administered 2024-01-05: 2 g via INTRAVENOUS
  Filled 2024-01-05: qty 20

## 2024-01-05 MED ORDER — SERTRALINE HCL 50 MG PO TABS
50.0000 mg | ORAL_TABLET | Freq: Every day | ORAL | Status: DC
  Administered 2024-01-05 – 2024-01-06 (×2): 50 mg via ORAL
  Filled 2024-01-05 (×2): qty 1

## 2024-01-05 MED ORDER — OXYCODONE HCL 5 MG/5ML PO SOLN: 5.0000 mg | Freq: Once | ORAL | Status: DC | PRN

## 2024-01-05 MED ORDER — HYDRALAZINE HCL 20 MG/ML IJ SOLN: 10.0000 mg | Freq: Four times a day (QID) | INTRAMUSCULAR | Status: DC | PRN

## 2024-01-05 MED ORDER — TAMSULOSIN HCL 0.4 MG PO CAPS
0.4000 mg | ORAL_CAPSULE | Freq: Every day | ORAL | Status: DC
  Administered 2024-01-05: 0.4 mg via ORAL
  Filled 2024-01-05: qty 1

## 2024-01-05 MED ORDER — INSULIN ASPART 100 UNIT/ML IJ SOLN
0.0000 [IU] | Freq: Three times a day (TID) | INTRAMUSCULAR | Status: DC
  Administered 2024-01-05: 7 [IU] via SUBCUTANEOUS
  Administered 2024-01-05: 20 [IU] via SUBCUTANEOUS
  Administered 2024-01-06: 11 [IU] via SUBCUTANEOUS
  Administered 2024-01-06: 7 [IU] via SUBCUTANEOUS

## 2024-01-05 MED ORDER — BUTORPHANOL TARTRATE 10 MG/ML NA SOLN
1.0000 | NASAL | Status: DC | PRN
  Administered 2024-01-05 (×2): 1 via NASAL
  Filled 2024-01-05: qty 2.5

## 2024-01-05 MED ORDER — KETOROLAC TROMETHAMINE 15 MG/ML IJ SOLN
15.0000 mg | Freq: Once | INTRAMUSCULAR | Status: AC
  Administered 2024-01-05: 15 mg via INTRAVENOUS
  Filled 2024-01-05: qty 1

## 2024-01-05 MED ORDER — HYDROMORPHONE HCL 1 MG/ML IJ SOLN
1.0000 mg | Freq: Once | INTRAMUSCULAR | Status: AC
  Administered 2024-01-05: 1 mg via INTRAVENOUS
  Filled 2024-01-05: qty 1

## 2024-01-05 MED ORDER — HYDROMORPHONE HCL 1 MG/ML IJ SOLN
1.0000 mg | INTRAMUSCULAR | Status: AC | PRN
  Administered 2024-01-06 (×3): 1 mg via INTRAVENOUS
  Filled 2024-01-05 (×3): qty 1

## 2024-01-05 MED ORDER — LACTATED RINGERS IV SOLN: INTRAVENOUS | Status: AC

## 2024-01-05 MED ORDER — LACTATED RINGERS IV SOLN: INTRAVENOUS | Status: DC | PRN

## 2024-01-05 MED ORDER — DEXAMETHASONE SODIUM PHOSPHATE 10 MG/ML IJ SOLN
INTRAMUSCULAR | Status: DC | PRN
  Administered 2024-01-05: 8 mg via INTRAVENOUS

## 2024-01-05 MED ORDER — FENTANYL CITRATE (PF) 100 MCG/2ML IJ SOLN
INTRAMUSCULAR | Status: AC
  Filled 2024-01-05: qty 2

## 2024-01-05 MED ORDER — SIMETHICONE 80 MG PO CHEW
80.0000 mg | CHEWABLE_TABLET | Freq: Four times a day (QID) | ORAL | Status: DC | PRN
  Administered 2024-01-05: 80 mg via ORAL
  Filled 2024-01-05: qty 1

## 2024-01-05 MED ORDER — MIDAZOLAM HCL 2 MG/2ML IJ SOLN
INTRAMUSCULAR | Status: AC
  Filled 2024-01-05: qty 2

## 2024-01-05 MED ORDER — FENTANYL CITRATE PF 50 MCG/ML IJ SOSY: 25.0000 ug | PREFILLED_SYRINGE | INTRAMUSCULAR | Status: DC | PRN

## 2024-01-05 MED ORDER — METHOCARBAMOL 1000 MG/10ML IJ SOLN
500.0000 mg | Freq: Four times a day (QID) | INTRAMUSCULAR | Status: DC | PRN
  Administered 2024-01-05 – 2024-01-06 (×2): 500 mg via INTRAVENOUS
  Filled 2024-01-05 (×2): qty 10

## 2024-01-05 MED ORDER — LISINOPRIL 10 MG PO TABS: 10.0000 mg | ORAL_TABLET | Freq: Every day | ORAL | Status: DC

## 2024-01-05 MED ORDER — CYCLOBENZAPRINE HCL 10 MG PO TABS
10.0000 mg | ORAL_TABLET | Freq: Three times a day (TID) | ORAL | Status: DC | PRN
  Filled 2024-01-05: qty 1

## 2024-01-05 MED ORDER — FENTANYL CITRATE (PF) 100 MCG/2ML IJ SOLN
INTRAMUSCULAR | Status: DC | PRN
  Administered 2024-01-05: 50 ug via INTRAVENOUS

## 2024-01-05 MED ORDER — PROPOFOL 10 MG/ML IV BOLUS
INTRAVENOUS | Status: DC | PRN
  Administered 2024-01-05: 180 mg via INTRAVENOUS

## 2024-01-05 MED ORDER — LISINOPRIL-HYDROCHLOROTHIAZIDE 10-12.5 MG PO TABS: 1.0000 | ORAL_TABLET | Freq: Every day | ORAL | Status: DC

## 2024-01-05 MED ORDER — OXYCODONE HCL 5 MG PO TABS: 5.0000 mg | ORAL_TABLET | Freq: Once | ORAL | Status: DC | PRN

## 2024-01-05 MED ORDER — ONDANSETRON HCL 4 MG/2ML IJ SOLN
INTRAMUSCULAR | Status: DC | PRN
  Administered 2024-01-05: 4 mg via INTRAVENOUS

## 2024-01-05 MED ORDER — FAMOTIDINE IN NACL 20-0.9 MG/50ML-% IV SOLN
20.0000 mg | Freq: Once | INTRAVENOUS | Status: AC
  Administered 2024-01-05: 20 mg via INTRAVENOUS
  Filled 2024-01-05: qty 50

## 2024-01-05 MED ORDER — INSULIN ASPART 100 UNIT/ML IJ SOLN: 0.0000 [IU] | Freq: Every day | INTRAMUSCULAR | Status: DC

## 2024-01-05 MED ORDER — HYDROCHLOROTHIAZIDE 12.5 MG PO TABS: 12.5000 mg | ORAL_TABLET | Freq: Every day | ORAL | Status: DC

## 2024-01-05 MED ORDER — SODIUM CHLORIDE 0.9 % IV SOLN
25.0000 mg | Freq: Once | INTRAVENOUS | Status: AC
  Administered 2024-01-05: 25 mg via INTRAVENOUS
  Filled 2024-01-05: qty 1

## 2024-01-05 MED ORDER — MIDAZOLAM HCL 5 MG/5ML IJ SOLN
INTRAMUSCULAR | Status: DC | PRN
Start: 2024-01-05 — End: 2024-01-05
  Administered 2024-01-05: 2 mg via INTRAVENOUS

## 2024-01-05 MED ORDER — HYDROCODONE-ACETAMINOPHEN 10-325 MG PO TABS
1.0000 | ORAL_TABLET | Freq: Four times a day (QID) | ORAL | Status: DC | PRN
  Administered 2024-01-05 – 2024-01-06 (×3): 1 via ORAL
  Filled 2024-01-05 (×3): qty 1

## 2024-01-05 MED ORDER — INSULIN ASPART 100 UNIT/ML IJ SOLN
INTRAMUSCULAR | Status: AC
  Filled 2024-01-05: qty 1

## 2024-01-05 MED ORDER — INSULIN GLARGINE-YFGN 100 UNIT/ML ~~LOC~~ SOLN
10.0000 [IU] | Freq: Every day | SUBCUTANEOUS | Status: DC
  Administered 2024-01-05: 10 [IU] via SUBCUTANEOUS
  Filled 2024-01-05: qty 0.1

## 2024-01-05 MED ORDER — IOHEXOL 300 MG/ML  SOLN
INTRAMUSCULAR | Status: DC | PRN
  Administered 2024-01-05: 8 mL via URETHRAL

## 2024-01-05 MED ORDER — INSULIN ASPART 100 UNIT/ML IJ SOLN
0.0000 [IU] | INTRAMUSCULAR | Status: DC | PRN
  Administered 2024-01-05: 4 [IU] via SUBCUTANEOUS

## 2024-01-05 MED ORDER — INSULIN GLARGINE 100 UNITS/ML SOLOSTAR PEN: 10.0000 [IU] | PEN_INJECTOR | SUBCUTANEOUS | Status: DC

## 2024-01-05 MED ORDER — INSULIN GLARGINE-YFGN 100 UNIT/ML ~~LOC~~ SOLN
10.0000 [IU] | Freq: Two times a day (BID) | SUBCUTANEOUS | Status: DC
  Administered 2024-01-05 – 2024-01-06 (×2): 10 [IU] via SUBCUTANEOUS
  Filled 2024-01-05 (×3): qty 0.1

## 2024-01-05 MED ORDER — CHLORHEXIDINE GLUCONATE 0.12 % MT SOLN
15.0000 mL | Freq: Once | OROMUCOSAL | Status: AC
  Administered 2024-01-05: 15 mL via OROMUCOSAL

## 2024-01-05 MED ORDER — QUETIAPINE FUMARATE 25 MG PO TABS
25.0000 mg | ORAL_TABLET | Freq: Every evening | ORAL | Status: DC
  Administered 2024-01-05: 25 mg via ORAL
  Filled 2024-01-05: qty 1

## 2024-01-05 MED ORDER — ONDANSETRON HCL 4 MG/2ML IJ SOLN: 4.0000 mg | Freq: Four times a day (QID) | INTRAMUSCULAR | Status: DC | PRN

## 2024-01-05 MED ORDER — LIDOCAINE HCL (PF) 2 % IJ SOLN
INTRAMUSCULAR | Status: DC | PRN
  Administered 2024-01-05: 60 mg via INTRADERMAL

## 2024-01-05 MED ORDER — STERILE WATER FOR IRRIGATION IR SOLN
Status: DC | PRN
Start: 2024-01-05 — End: 2024-01-05
  Administered 2024-01-05: 3000 mL

## 2024-01-05 SURGICAL SUPPLY — 11 items
BAG URO CATCHER STRL LF (MISCELLANEOUS) ×1 IMPLANT
CATH URETL OPEN END 6FR 70 (CATHETERS) ×1 IMPLANT
CLOTH BEACON ORANGE TIMEOUT ST (SAFETY) ×1 IMPLANT
GLOVE BIO SURGEON STRL SZ7.5 (GLOVE) ×1 IMPLANT
GOWN STRL REUS W/ TWL XL LVL3 (GOWN DISPOSABLE) ×1 IMPLANT
GUIDEWIRE STR DUAL SENSOR (WIRE) ×1 IMPLANT
KIT TURNOVER KIT A (KITS) ×1 IMPLANT
MANIFOLD NEPTUNE II (INSTRUMENTS) ×1 IMPLANT
PACK CYSTO (CUSTOM PROCEDURE TRAY) ×1 IMPLANT
STENT URET 6FRX26 CONTOUR (STENTS) IMPLANT
TUBING CONNECTING 10 (TUBING) ×1 IMPLANT

## 2024-01-05 NOTE — H&P (Signed)
 H&P Physician requesting consult: Bryan [facility]   Chief Complaint: Right ureteral calculus  History of Present Illness: 58 year old male presented with severe right-sided flank pain.  CT scan showed a 6 mm obstructing right ureteral calculus.  Also had a leukocytosis of 15 and maximum temperature 99.9.  Feeling a little bit better this morning but presents for ureteral stent placement given ureteral obstruction, leukocytosis, low-grade temperature.  Received ceftriaxone last night.  Past Medical History:  Diagnosis Date   Abdominal migraine    Cervical neuralgia    Cyclical vomiting    Elevated hemoglobin (HCC)    Hypertension    Renal disorder    kidney stone   Sleep apnea    states uses O2 and CPAP at night   Slipped intervertebral disc    Past Surgical History:  Procedure Laterality Date   ESOPHAGOGASTRODUODENOSCOPY      Home Medications:  Medications Prior to Admission  Medication Sig Dispense Refill Last Dose/Taking   butorphanol  (STADOL ) 10 MG/ML nasal spray Place 1 spray into the nose every 4 (four) hours as needed for headache or migraine.    Unknown   cyclobenzaprine  (FLEXERIL ) 10 MG tablet Take 10 mg by mouth 3 (three) times daily as needed for muscle spasms.    Unknown   HYDROcodone -acetaminophen  (NORCO) 10-325 MG tablet Take 1 tablet by mouth 4 (four) times daily as needed for moderate pain (pain score 4-6).   01/04/2024 Morning   levorphanol (LEVODROMORAN) 2 MG tablet Take 3 mg by mouth every 6 (six) hours as needed for pain.   01/04/2024 Morning   lisinopril -hydrochlorothiazide  (ZESTORETIC ) 10-12.5 MG tablet Take 1 tablet by mouth daily. 90 tablet 1 01/03/2024 Morning   loperamide  (IMODIUM ) 2 MG capsule Take 2 mg by mouth as needed for diarrhea or loose stools.   Past Week   meloxicam  (MOBIC ) 15 MG tablet TAKE 1 TABLET (15 MG TOTAL) BY MOUTH DAILY. 90 tablet 1 01/03/2024 Morning   ondansetron  (ZOFRAN -ODT) 8 MG disintegrating tablet Take 8 mg by mouth every 8 (eight)  hours as needed for nausea or vomiting.   01/04/2024 Morning   pseudoephedrine (SUDAFED) 120 MG 12 hr tablet Take 120 mg by mouth daily as needed for congestion.   Past Week   QUEtiapine  (SEROQUEL ) 25 MG tablet Take 25 mg by mouth every evening.   01/03/2024 Bedtime   SALINE NASAL SPRAY NA Place 1 spray into the nose daily as needed (dryness). Reported on 09/17/2015   Unknown   sertraline  (ZOLOFT ) 50 MG tablet TAKE 1 TABLET BY MOUTH EVERY DAY 90 tablet 1 01/03/2024 Morning   simethicone  (MYLICON) 80 MG chewable tablet Chew 80 mg by mouth every 6 (six) hours as needed for flatulence.   Past Week   tamsulosin  (FLOMAX ) 0.4 MG CAPS capsule Take 0.4 mg by mouth daily after supper.   01/03/2024 Bedtime   Testosterone  20.25 MG/ACT (1.62%) GEL APPLY 3 PUMP TOPICALLY DAILY. 75 g 3 01/03/2024 Morning   Testosterone  Undecanoate (JATENZO ) 237 MG CAPS Take 1 capsule (237 mg total) by mouth 2 (two) times daily. 60 capsule 5 01/03/2024 Bedtime   vitamin B-12 (CYANOCOBALAMIN) 100 MCG tablet Take 100 mcg by mouth daily.   01/03/2024 Morning   Allergies:  Allergies  Allergen Reactions   Cefprozil  Diarrhea and Nausea Only    Intestinal upset and diarrhea   Lipitor [Atorvastatin] Other (See Comments)    Myalgia   Xtandi [Enzalutamide] Other (See Comments)    GI upset    Family History  Problem Relation  Age of Onset   Heart failure Father    Hypertension Father    Social History:  reports that he has never smoked. He has never used smokeless tobacco. He reports current alcohol use. He reports that he does not use drugs.  ROS: A complete review of systems was performed.  All systems are negative except for pertinent findings as noted. ROS   Physical Exam:  Vital signs in last 24 hours: Temp:  [97.9 F (36.6 C)-99.9 F (37.7 C)] 98.5 F (36.9 C) (07/02 9357) Pulse Rate:  [83-115] 83 (07/02 0642) Resp:  [14-19] 18 (07/02 0642) BP: (123-184)/(73-116) 153/96 (07/02 0646) SpO2:  [90 %-96 %] 93 % (07/02  0642) Weight:  [112.9 kg] 112.9 kg (07/02 0703) General:  Alert and oriented, No acute distress HEENT: Normocephalic, atraumatic Neck: No JVD or lymphadenopathy Cardiovascular: Regular rate and rhythm Lungs: Regular rate and effort Abdomen: Soft, nontender, nondistended, no abdominal masses Back: No CVA tenderness Extremities: No edema Neurologic: Grossly intact  Laboratory Data:  Results for orders placed or performed during the hospital encounter of 01/04/24 (from the past 24 hours)  Lipase, blood     Status: None   Collection Time: 01/04/24  1:52 PM  Result Value Ref Range   Lipase 22 11 - 51 U/L  Comprehensive metabolic panel     Status: Abnormal   Collection Time: 01/04/24  1:52 PM  Result Value Ref Range   Sodium 136 135 - 145 mmol/L   Potassium 4.3 3.5 - 5.1 mmol/L   Chloride 100 98 - 111 mmol/L   CO2 18 (L) 22 - 32 mmol/L   Glucose, Bld 371 (H) 70 - 99 mg/dL   BUN 17 6 - 20 mg/dL   Creatinine, Ser 8.64 (H) 0.61 - 1.24 mg/dL   Calcium  9.2 8.9 - 10.3 mg/dL   Total Protein 8.2 (H) 6.5 - 8.1 g/dL   Albumin 4.4 3.5 - 5.0 g/dL   AST 51 (H) 15 - 41 U/L   ALT 80 (H) 0 - 44 U/L   Alkaline Phosphatase 82 38 - 126 U/L   Total Bilirubin 1.4 (H) 0.0 - 1.2 mg/dL   GFR, Estimated >39 >39 mL/min   Anion gap 18 (H) 5 - 15  CBC with Differential     Status: Abnormal   Collection Time: 01/04/24  1:52 PM  Result Value Ref Range   WBC 15.9 (H) 4.0 - 10.5 K/uL   RBC 6.78 (H) 4.22 - 5.81 MIL/uL   Hemoglobin 19.9 (H) 13.0 - 17.0 g/dL   HCT 41.8 (H) 60.9 - 47.9 %   MCV 85.7 80.0 - 100.0 fL   MCH 29.4 26.0 - 34.0 pg   MCHC 34.3 30.0 - 36.0 g/dL   RDW 87.4 88.4 - 84.4 %   Platelets 331 150 - 400 K/uL   nRBC 0.0 0.0 - 0.2 %   Neutrophils Relative % 88 %   Neutro Abs 13.9 (H) 1.7 - 7.7 K/uL   Lymphocytes Relative 6 %   Lymphs Abs 1.0 0.7 - 4.0 K/uL   Monocytes Relative 5 %   Monocytes Absolute 0.8 0.1 - 1.0 K/uL   Eosinophils Relative 0 %   Eosinophils Absolute 0.0 0.0 - 0.5 K/uL    Basophils Relative 0 %   Basophils Absolute 0.0 0.0 - 0.1 K/uL   Immature Granulocytes 1 %   Abs Immature Granulocytes 0.13 (H) 0.00 - 0.07 K/uL  CBG monitoring, ED     Status: Abnormal   Collection Time:  01/04/24  3:50 PM  Result Value Ref Range   Glucose-Capillary 345 (H) 70 - 99 mg/dL  Basic metabolic panel     Status: Abnormal   Collection Time: 01/04/24  4:09 PM  Result Value Ref Range   Sodium 137 135 - 145 mmol/L   Potassium 4.8 3.5 - 5.1 mmol/L   Chloride 104 98 - 111 mmol/L   CO2 19 (L) 22 - 32 mmol/L   Glucose, Bld 344 (H) 70 - 99 mg/dL   BUN 17 6 - 20 mg/dL   Creatinine, Ser 8.75 0.61 - 1.24 mg/dL   Calcium  8.2 (L) 8.9 - 10.3 mg/dL   GFR, Estimated >39 >39 mL/min   Anion gap 14 5 - 15  POC CBG, ED     Status: Abnormal   Collection Time: 01/04/24  6:27 PM  Result Value Ref Range   Glucose-Capillary 327 (H) 70 - 99 mg/dL  Urinalysis, Routine w reflex microscopic -Urine, Clean Catch     Status: Abnormal   Collection Time: 01/04/24  6:36 PM  Result Value Ref Range   Color, Urine YELLOW YELLOW   APPearance HAZY (A) CLEAR   Specific Gravity, Urine 1.022 1.005 - 1.030   pH 5.0 5.0 - 8.0   Glucose, UA >=500 (A) NEGATIVE mg/dL   Hgb urine dipstick LARGE (A) NEGATIVE   Bilirubin Urine NEGATIVE NEGATIVE   Ketones, ur 20 (A) NEGATIVE mg/dL   Protein, ur NEGATIVE NEGATIVE mg/dL   Nitrite NEGATIVE NEGATIVE   Leukocytes,Ua NEGATIVE NEGATIVE   RBC / HPF 11-20 0 - 5 RBC/hpf   WBC, UA 0-5 0 - 5 WBC/hpf   Bacteria, UA RARE (A) NONE SEEN   Squamous Epithelial / HPF 0-5 0 - 5 /HPF   Mucus PRESENT    Hyaline Casts, UA PRESENT   Glucose, capillary     Status: Abnormal   Collection Time: 01/05/24  6:50 AM  Result Value Ref Range   Glucose-Capillary 217 (H) 70 - 99 mg/dL   Comment 1 Notify RN    Comment 2 Document in Chart    No results found for this or any previous visit (from the past 240 hours). Creatinine: Recent Labs    01/04/24 1352 01/04/24 1609  CREATININE  1.35* 1.24    Impression/Assessment:  Right ureteral calculus  Plan:  Proceed with right ureteral stent placement.  Continue antibiotics.  Plan for ureteroscopy in 1 to 2 weeks.  Sherwood JONETTA Edison, III 01/05/2024, 8:11 AM

## 2024-01-05 NOTE — Progress Notes (Incomplete)
     Patient Name: Bryan Gilbert           DOB: August 28, 1965  MRN: [id number]      Admission Date: 01/04/2024  Attending Provider: Vernon Ranks, MD  Primary Diagnosis: Nephrolithiasis   Level of care: Med-Surg   CROSS COVERAGE NOTE       Date of Service   01/05/2024   Bryan Gilbert, 58 y.o. male, was admitted on 01/04/2024 for Nephrolithiasis.    HPI/Events of Note   Severe abdominal pain related to abdominal migraine Sees [facility] neurologist (Dr. Evalene Collet) for migraines as well as [facility] for chronic pain. Chronically on Stadol  nasal spray, Flexeril , levorphanol, Norco. Wife verbalizes concerns that if pain is not well controlled, he will begin to have severe cyclic vomiting. No improvement after receiving 0.5 mg IV Dilaudid  and Stadol  nasal spray, c/o periumbilical abdominal pain 8/10 and dry heaving despite Zofran  use. Patient verbalizes Imitrex has not helped in the past. No improvement in the past after the use of morphine  or fentanyl . Wife states that Dilaudid  is the only medication that has helped with migraines while hospitalized.   Bedside Assessment:  Patient is awake, A/O x***, with no associated distress.   Respiratory: Bilaterally clear, no wheezing, no crackles. Normal effort. No accessory muscle use.  Cardiovascular: Regular rate and rhythm. No extremity edema. 2+ pedal pulses. No carotid bruits.  Abdomen: Abdomen is soft and nontender.  Positive bowel sounds in all quadrants.    Addendum:    Interventions/ Plan   Additional IV Dilaudid  for severe breakthrough pain.  Migraine cocktail - IV Phenergan  and IV Toradol . IV Robaxin 2 mg IV magnesium  Continue reassessing pain and nausea.       Yasuko Lapage, DNP, ACNPC- AG [facility] [place]

## 2024-01-05 NOTE — H&P (View-Only) (Signed)
 H&P Physician requesting consult: Bryan Gilbert   Chief Complaint: Right ureteral calculus  History of Present Illness: 58 year old male presented with severe right-sided flank pain.  CT scan showed a 6 mm obstructing right ureteral calculus.  Also had a leukocytosis of 15 and maximum temperature 99.9.  Feeling a little bit better this morning but presents for ureteral stent placement given ureteral obstruction, leukocytosis, low-grade temperature.  Received ceftriaxone last night.  Past Medical History:  Diagnosis Date   Abdominal migraine    Cervical neuralgia    Cyclical vomiting    Elevated hemoglobin (HCC)    Hypertension    Renal disorder    kidney stone   Sleep apnea    states uses O2 and CPAP at night   Slipped intervertebral disc    Past Surgical History:  Procedure Laterality Date   ESOPHAGOGASTRODUODENOSCOPY      Home Medications:  Medications Prior to Admission  Medication Sig Dispense Refill Last Dose/Taking   butorphanol  (STADOL ) 10 MG/ML nasal spray Place 1 spray into the nose every 4 (four) hours as needed for headache or migraine.    Unknown   cyclobenzaprine  (FLEXERIL ) 10 MG tablet Take 10 mg by mouth 3 (three) times daily as needed for muscle spasms.    Unknown   HYDROcodone -acetaminophen  (NORCO) 10-325 MG tablet Take 1 tablet by mouth 4 (four) times daily as needed for moderate pain (pain score 4-6).   01/04/2024 Morning   levorphanol (LEVODROMORAN) 2 MG tablet Take 3 mg by mouth every 6 (six) hours as needed for pain.   01/04/2024 Morning   lisinopril -hydrochlorothiazide  (ZESTORETIC ) 10-12.5 MG tablet Take 1 tablet by mouth daily. 90 tablet 1 01/03/2024 Morning   loperamide  (IMODIUM ) 2 MG capsule Take 2 mg by mouth as needed for diarrhea or loose stools.   Past Week   meloxicam  (MOBIC ) 15 MG tablet TAKE 1 TABLET (15 MG TOTAL) BY MOUTH DAILY. 90 tablet 1 01/03/2024 Morning   ondansetron  (ZOFRAN -ODT) 8 MG disintegrating tablet Take 8 mg by mouth every 8 (eight)  hours as needed for nausea or vomiting.   01/04/2024 Morning   pseudoephedrine (SUDAFED) 120 MG 12 hr tablet Take 120 mg by mouth daily as needed for congestion.   Past Week   QUEtiapine  (SEROQUEL ) 25 MG tablet Take 25 mg by mouth every evening.   01/03/2024 Bedtime   SALINE NASAL SPRAY NA Place 1 spray into the nose daily as needed (dryness). Reported on 09/17/2015   Unknown   sertraline  (ZOLOFT ) 50 MG tablet TAKE 1 TABLET BY MOUTH EVERY DAY 90 tablet 1 01/03/2024 Morning   simethicone  (MYLICON) 80 MG chewable tablet Chew 80 mg by mouth every 6 (six) hours as needed for flatulence.   Past Week   tamsulosin  (FLOMAX ) 0.4 MG CAPS capsule Take 0.4 mg by mouth daily after supper.   01/03/2024 Bedtime   Testosterone  20.25 MG/ACT (1.62%) GEL APPLY 3 PUMP TOPICALLY DAILY. 75 g 3 01/03/2024 Morning   Testosterone  Undecanoate (JATENZO ) 237 MG CAPS Take 1 capsule (237 mg total) by mouth 2 (two) times daily. 60 capsule 5 01/03/2024 Bedtime   vitamin B-12 (CYANOCOBALAMIN) 100 MCG tablet Take 100 mcg by mouth daily.   01/03/2024 Morning   Allergies:  Allergies  Allergen Reactions   Cefprozil  Diarrhea and Nausea Only    Intestinal upset and diarrhea   Lipitor [Atorvastatin] Other (See Comments)    Myalgia   Xtandi [Enzalutamide] Other (See Comments)    GI upset    Family History  Problem Relation  Age of Onset   Heart failure Father    Hypertension Father    Social History:  reports that he has never smoked. He has never used smokeless tobacco. He reports current alcohol use. He reports that he does not use drugs.  ROS: A complete review of systems was performed.  All systems are negative except for pertinent findings as noted. ROS   Physical Exam:  Vital signs in last 24 hours: Temp:  [97.9 F (36.6 C)-99.9 F (37.7 C)] 98.5 F (36.9 C) (07/02 9357) Pulse Rate:  [83-115] 83 (07/02 0642) Resp:  [14-19] 18 (07/02 0642) BP: (123-184)/(73-116) 153/96 (07/02 0646) SpO2:  [90 %-96 %] 93 % (07/02  0642) Weight:  [112.9 kg] 112.9 kg (07/02 0703) General:  Alert and oriented, No acute distress HEENT: Normocephalic, atraumatic Neck: No JVD or lymphadenopathy Cardiovascular: Regular rate and rhythm Lungs: Regular rate and effort Abdomen: Soft, nontender, nondistended, no abdominal masses Back: No CVA tenderness Extremities: No edema Neurologic: Grossly intact  Laboratory Data:  Results for orders placed or performed during the hospital encounter of 01/04/24 (from the past 24 hours)  Lipase, blood     Status: None   Collection Time: 01/04/24  1:52 PM  Result Value Ref Range   Lipase 22 11 - 51 U/L  Comprehensive metabolic panel     Status: Abnormal   Collection Time: 01/04/24  1:52 PM  Result Value Ref Range   Sodium 136 135 - 145 mmol/L   Potassium 4.3 3.5 - 5.1 mmol/L   Chloride 100 98 - 111 mmol/L   CO2 18 (L) 22 - 32 mmol/L   Glucose, Bld 371 (H) 70 - 99 mg/dL   BUN 17 6 - 20 mg/dL   Creatinine, Ser 8.64 (H) 0.61 - 1.24 mg/dL   Calcium  9.2 8.9 - 10.3 mg/dL   Total Protein 8.2 (H) 6.5 - 8.1 g/dL   Albumin 4.4 3.5 - 5.0 g/dL   AST 51 (H) 15 - 41 U/L   ALT 80 (H) 0 - 44 U/L   Alkaline Phosphatase 82 38 - 126 U/L   Total Bilirubin 1.4 (H) 0.0 - 1.2 mg/dL   GFR, Estimated >39 >39 mL/min   Anion gap 18 (H) 5 - 15  CBC with Differential     Status: Abnormal   Collection Time: 01/04/24  1:52 PM  Result Value Ref Range   WBC 15.9 (H) 4.0 - 10.5 K/uL   RBC 6.78 (H) 4.22 - 5.81 MIL/uL   Hemoglobin 19.9 (H) 13.0 - 17.0 g/dL   HCT 41.8 (H) 60.9 - 47.9 %   MCV 85.7 80.0 - 100.0 fL   MCH 29.4 26.0 - 34.0 pg   MCHC 34.3 30.0 - 36.0 g/dL   RDW 87.4 88.4 - 84.4 %   Platelets 331 150 - 400 K/uL   nRBC 0.0 0.0 - 0.2 %   Neutrophils Relative % 88 %   Neutro Abs 13.9 (H) 1.7 - 7.7 K/uL   Lymphocytes Relative 6 %   Lymphs Abs 1.0 0.7 - 4.0 K/uL   Monocytes Relative 5 %   Monocytes Absolute 0.8 0.1 - 1.0 K/uL   Eosinophils Relative 0 %   Eosinophils Absolute 0.0 0.0 - 0.5 K/uL    Basophils Relative 0 %   Basophils Absolute 0.0 0.0 - 0.1 K/uL   Immature Granulocytes 1 %   Abs Immature Granulocytes 0.13 (H) 0.00 - 0.07 K/uL  CBG monitoring, ED     Status: Abnormal   Collection Time:  01/04/24  3:50 PM  Result Value Ref Range   Glucose-Capillary 345 (H) 70 - 99 mg/dL  Basic metabolic panel     Status: Abnormal   Collection Time: 01/04/24  4:09 PM  Result Value Ref Range   Sodium 137 135 - 145 mmol/L   Potassium 4.8 3.5 - 5.1 mmol/L   Chloride 104 98 - 111 mmol/L   CO2 19 (L) 22 - 32 mmol/L   Glucose, Bld 344 (H) 70 - 99 mg/dL   BUN 17 6 - 20 mg/dL   Creatinine, Ser 8.75 0.61 - 1.24 mg/dL   Calcium  8.2 (L) 8.9 - 10.3 mg/dL   GFR, Estimated >39 >39 mL/min   Anion gap 14 5 - 15  POC CBG, ED     Status: Abnormal   Collection Time: 01/04/24  6:27 PM  Result Value Ref Range   Glucose-Capillary 327 (H) 70 - 99 mg/dL  Urinalysis, Routine w reflex microscopic -Urine, Clean Catch     Status: Abnormal   Collection Time: 01/04/24  6:36 PM  Result Value Ref Range   Color, Urine YELLOW YELLOW   APPearance HAZY (A) CLEAR   Specific Gravity, Urine 1.022 1.005 - 1.030   pH 5.0 5.0 - 8.0   Glucose, UA >=500 (A) NEGATIVE mg/dL   Hgb urine dipstick LARGE (A) NEGATIVE   Bilirubin Urine NEGATIVE NEGATIVE   Ketones, ur 20 (A) NEGATIVE mg/dL   Protein, ur NEGATIVE NEGATIVE mg/dL   Nitrite NEGATIVE NEGATIVE   Leukocytes,Ua NEGATIVE NEGATIVE   RBC / HPF 11-20 0 - 5 RBC/hpf   WBC, UA 0-5 0 - 5 WBC/hpf   Bacteria, UA RARE (A) NONE SEEN   Squamous Epithelial / HPF 0-5 0 - 5 /HPF   Mucus PRESENT    Hyaline Casts, UA PRESENT   Glucose, capillary     Status: Abnormal   Collection Time: 01/05/24  6:50 AM  Result Value Ref Range   Glucose-Capillary 217 (H) 70 - 99 mg/dL   Comment 1 Notify RN    Comment 2 Document in Chart    No results found for this or any previous visit (from the past 240 hours). Creatinine: Recent Labs    01/04/24 1352 01/04/24 1609  CREATININE  1.35* 1.24    Impression/Assessment:  Right ureteral calculus  Plan:  Proceed with right ureteral stent placement.  Continue antibiotics.  Plan for ureteroscopy in 1 to 2 weeks.  Sherwood JONETTA Edison, III 01/05/2024, 8:11 AM

## 2024-01-05 NOTE — Anesthesia Postprocedure Evaluation (Signed)
 Anesthesia Post Note  Patient: Bryan Gilbert  Procedure(s) Performed: CYSTOSCOPY, WITH RETROGRADE PYELOGRAM AND URETERAL STENT INSERTION (Right)     Patient location during evaluation: PACU Anesthesia Type: General Level of consciousness: awake and alert Pain management: pain level controlled Vital Signs Assessment: post-procedure vital signs reviewed and stable Respiratory status: spontaneous breathing, nonlabored ventilation, respiratory function stable and patient connected to nasal cannula oxygen Cardiovascular status: blood pressure returned to baseline and stable Postop Assessment: no apparent nausea or vomiting Anesthetic complications: no   No notable events documented.  Last Vitals:  Vitals:   01/05/24 1019 01/05/24 1402  BP: (!) 142/84 (!) 147/84  Pulse: 64 84  Resp: 20 17  Temp:    SpO2: 99% 94%    Last Pain:  Vitals:   01/05/24 1158  TempSrc:   PainSc: 8                  Tomeka Kantner S

## 2024-01-05 NOTE — Progress Notes (Signed)
 PROGRESS NOTE    Bryan Gilbert  FMW:[id number] DOB: 05-28-1966 DOA: 01/04/2024 PCP: Alphonsa Glendia LABOR, MD   Brief Narrative:  HPI: Bryan Gilbert is a 58 y.o. male with medical history significant of OSA on CPAP, abdominal migraine, hypertension, chronic back pain and history of kidney stone who presents to the emergency department due to right flank pain which started this morning around 6:30 AM, pain was rated as 8/10 on pain scale.  He also complained of dizziness and nausea as well as difficulty in being able to urinate.   ED Course:  In the emergency department, patient was tachycardic, BP was 171/115, other vital signs were within normal range.  Workup in the ED showed WBC of 15.9, hemoglobin 19.9, hematocrit 50.1, MCV 85.7, platelets 331.  BMP was normal except for bicarb of 18, blood glucose 371, BUN 17, creatinine 1.35, AST 51, ALT 80, ALP 82.  Urinalysis was positive for glycosuria and hematuria CT abdomen and pelvis without contrast showed obstructing 4 x 6 mm stone in the mid right ureter with mild proximal hydroureteronephrosis and perinephric stranding. He was treated with IV Dilaudid , IV fentanyl , Zofran , Compazine , Protonix .  IV NS 2L was provided. Urologist (Dr. Carolee) was consulted and recommended admitting patient to [facility] with plan to possibly place a stent in the morning.  IV ceftriaxone was given.  [facility] was asked to admit patient  Assessment & Plan:   Principal Problem:   Nephrolithiasis Active Problems:   Essential hypertension   Abdominal migraine   Chronic back pain   Leukocytosis   AKI (acute kidney injury) (HCC)   Dehydration   Nausea and vomiting  Obstructive nephrolithiasis/right-sided hydroureteronephrosis/sepsis secondary to right-sided pyelonephritis, POA: UA indicative of UTI.  CT scan also shows right pyelonephritis.  Patient met criteria for sepsis based on tachycardia and leukocytosis.  No lactic acid in chart.  Will check that today.  Also repeat CBC.   Received a dose of Rocephin yesterday.  Will resume that.  Will also order urine as well as blood culture.  Urology on board, patient underwent cystoscopy with right retrograde pyelogram and right ureteral stent placement by Dr. Carolee on 01/05/2024.  AKI: Baseline creatinine around 0.8-1.  Presented 1.35 which resolved.  AKI likely secondary to ATN in the setting of sepsis.  Nausea and vomiting: Secondary to sepsis.  Improving.  Treat symptomatically.  Mild metabolic acidosis: CO2 19 yesterday.  Received some fluids.  Rechecking BMP.  If is still low, will hydrate with IV fluids.  Type 2 diabetes mellitus, uncontrolled with hyperglycemia: Last hemoglobin A1c in chart from February 2025 was 8.2.  Patient does not appear to be on any medications for diabetes.  Significantly hyperglycemic here.  Will check hemoglobin A1c, start on resistant scale SSI as well as Lantus 10 units.  OSA on CPAP: Continue CPAP.  Essential hypertension: Blood pressure slightly elevated but that is likely due to pain.  Due to AKI, will discontinue lisinopril .  Placed him on as needed hydralazine .  Chronic back pain: Continue Dilaudid .  Abdominal migraine: Wife at the bedside explains that patient was diagnosed with abdominal migraine by neurologist at [facility] several years ago.  Patient is complaining of abdominal pain, 7 out of 10, located at periumbilical area.  Does not complain of right flank pain.  Is nauseous.  Has received Zofran  just 15 minutes ago.  If no improvement in 30 minutes, will try Phenergan .  Wife insisted that neurologist has said that once the pain kicks in  like he does have now, they recommend Dilaudid  2 mg followed by another 2 mg 30 minutes and followed by another 2 mg an hour after that and to monitor him closely for respiratory pression.  Had a lengthy discussion with the wife that these are really high dose medications and we are unable to do that as his pain is only 7 out of 10.  She further told me that  patient is on Stadol  nasal spray every 4 hours as needed which I have resumed per her request.  He is also on Flexeril  which also have resumed.  However patient is also on levorphanol 3 mg every 6 hours as needed and Norco 10/325 mg to be 4 hours as needed but wife claims that patient takes combination of both but takes levorphanol 4.5 mg instead of 3 mg.  Again, discussed with the wife, these are really heavy medications.  Patient already has 1 mg Dilaudid  ordered by urology which we are going to give him along with his home dose of Norco and see how he does.  Wife understands that.  DVT prophylaxis: SCDs Start: 01/04/24 2157   Code Status: Full Code  Family Communication: Wife present at bedside.  Plan of care discussed with patient in length and he/she verbalized understanding and agreed with it.  Status is: Inpatient Remains inpatient appropriate because: Just underwent urological procedure   Estimated body mass index is 38.98 kg/m as calculated from the following:   Height as of this encounter: 5' 7 (1.702 m).   Weight as of this encounter: 112.9 kg.    Nutritional Assessment: Body mass index is 38.98 kg/m.SABRA Seen by dietician.  I agree with the assessment and plan as outlined below: Nutrition Status:        . Skin Assessment: I have examined the patient's skin and I agree with the wound assessment as performed by the wound care RN as outlined below:    Consultants:   urology  Procedures:  As above  Antimicrobials:  Anti-infectives (From admission, onward)    Start     Dose/Rate Route Frequency Ordered Stop   01/04/24 2000  cefTRIAXone (ROCEPHIN) 2 g in sodium chloride  0.9 % 100 mL IVPB        2 g 200 mL/hr over 30 Minutes Intravenous  Once 01/04/24 1948 01/04/24 2054         Subjective: Patient seen and examined, wife and nurse at the bedside.  Patient complaining of 7 out of 10.  Little pain and is nauseous.  No other complaint.  Objective: Vitals:    01/05/24 0551 01/05/24 0642 01/05/24 0646 01/05/24 0703  BP: (!) 140/86 (!) 184/116 (!) 153/96   Pulse: 87 83    Resp: 18 18    Temp:  98.5 F (36.9 C)    TempSrc:  Oral    SpO2: 95% 93%    Weight:    112.9 kg  Height:    5' 7 (1.702 m)    Intake/Output Summary (Last 24 hours) at 01/05/2024 0744 Last data filed at 01/05/2024 0511 Gross per 24 hour  Intake 150 ml  Output --  Net 150 ml   Filed Weights   01/04/24 1310 01/05/24 0703  Weight: 112.9 kg 112.9 kg    Examination:  General exam: Appears in pain and curled up Respiratory system: Clear to auscultation. Respiratory effort normal. Cardiovascular system: S1 & S2 heard, RRR. No JVD, murmurs, rubs, gallops or clicks. No pedal edema. Gastrointestinal system: Abdomen is nondistended,  soft and nontender. No organomegaly or masses felt. Normal bowel sounds heard. Central nervous system: Alert and oriented. No focal neurological deficits. Extremities: Symmetric 5 x 5 power. Skin: No rashes, lesions or ulcers  Data Reviewed: I have personally reviewed following labs and imaging studies  CBC: Recent Labs  Lab 01/04/24 1352  WBC 15.9*  NEUTROABS 13.9*  HGB 19.9*  HCT 58.1*  MCV 85.7  PLT 331   Basic Metabolic Panel: Recent Labs  Lab 01/04/24 1352 01/04/24 1609  NA 136 137  K 4.3 4.8  CL 100 104  CO2 18* 19*  GLUCOSE 371* 344*  BUN 17 17  CREATININE 1.35* 1.24  CALCIUM  9.2 8.2*   GFR: Estimated Creatinine Clearance: 78.8 mL/min (by C-G formula based on SCr of 1.24 mg/dL). Liver Function Tests: Recent Labs  Lab 01/04/24 1352  AST 51*  ALT 80*  ALKPHOS 82  BILITOT 1.4*  PROT 8.2*  ALBUMIN 4.4   Recent Labs  Lab 01/04/24 1352  LIPASE 22   No results for input(s): AMMONIA in the last 168 hours. Coagulation Profile: No results for input(s): INR, PROTIME in the last 168 hours. Cardiac Enzymes: No results for input(s): CKTOTAL, CKMB, CKMBINDEX, TROPONINI in the last 168 hours. BNP (last  3 results) No results for input(s): PROBNP in the last 8760 hours. HbA1C: No results for input(s): HGBA1C in the last 72 hours. CBG: Recent Labs  Lab 01/04/24 1550 01/04/24 1827 01/05/24 0650  GLUCAP 345* 327* 217*   Lipid Profile: No results for input(s): CHOL, HDL, LDLCALC, TRIG, CHOLHDL, LDLDIRECT in the last 72 hours. Thyroid Function Tests: No results for input(s): TSH, T4TOTAL, FREET4, T3FREE, THYROIDAB in the last 72 hours. Anemia Panel: No results for input(s): VITAMINB12, FOLATE, FERRITIN, TIBC, IRON, RETICCTPCT in the last 72 hours. Sepsis Labs: No results for input(s): PROCALCITON, LATICACIDVEN in the last 168 hours.  No results found for this or any previous visit (from the past 240 hours).   Radiology Studies: CT Renal Stone Study Result Date: 01/04/2024 CLINICAL DATA:  Abdominal/flank pain, stone suspected right flank pain EXAM: CT ABDOMEN AND PELVIS WITHOUT CONTRAST TECHNIQUE: Multidetector CT imaging of the abdomen and pelvis was performed following the standard protocol without IV contrast. RADIATION DOSE REDUCTION: This exam was performed according to the departmental dose-optimization program which includes automated exposure control, adjustment of the mA and/or kV according to patient size and/or use of iterative reconstruction technique. COMPARISON:  CT 04/18/2023 FINDINGS: Lower chest: Clear lung bases. Hepatobiliary: The liver is enlarged spanning 19.3 cm cranial caudal with diffuse steatosis. No evidence of focal liver abnormality on this unenhanced exam. The gallbladder is partially distended. No calcified gallstone or pericholecystic inflammation. Stable biliary tree. Pancreas: Fatty atrophy.  No ductal dilatation or inflammation. Spleen: No splenomegaly.  No focal abnormality. Adrenals/Urinary Tract: No adrenal nodule. Obstructing 4 x 6 mm stone in the mid right ureter (at the L4 level) with mild proximal  hydroureteronephrosis and perinephric stranding. Simple cyst in the upper right kidney. No further follow-up imaging is recommended. Two 5 mm nonobstructing stones in the left kidney. No left hydronephrosis. Unremarkable urinary bladder. Stomach/Bowel: No bowel obstruction or inflammation. Moderate colonic stool burden. Normal appendix. Tiny hiatal hernia. Vascular/Lymphatic: Normal caliber abdominal aorta. Shotty periportal and portal caval nodes are likely reactive. No enlarged lymph nodes in the abdomen or pelvis. Reproductive: Prostate is unremarkable. Other: No free air, free fluid, or intra-abdominal fluid collection. Musculoskeletal: There are no acute or suspicious osseous abnormalities. L5-S1 degenerative disc disease IMPRESSION:  1. Obstructing 4 x 6 mm stone in the mid right ureter with mild proximal hydroureteronephrosis and perinephric stranding. 2. Nonobstructing left nephrolithiasis. 3. Hepatomegaly with hepatic steatosis. Electronically Signed   By: Andrea Gasman M.D.   On: 01/04/2024 16:48    Scheduled Meds:  insulin aspart       [MAR Hold] lisinopril   10 mg Oral Daily   [MAR Hold] pantoprazole  (PROTONIX ) IV  40 mg Intravenous Daily   Continuous Infusions:  lactated ringers  10 mL/hr at 01/05/24 0707     LOS: 1 day   Fredia Skeeter, MD [facility]  01/05/2024, 7:44 AM   *Please note that this is a verbal dictation therefore any spelling or grammatical errors are due to the Dragon Medical One system interpretation.  Please page via Amion and do not message via secure chat for urgent patient care matters. Secure chat can be used for non urgent patient care matters.  How to contact the [facility] Attending or Consulting provider 7A - 7P or covering provider during after hours 7P -7A, for this patient?  Check the care team in [facility] and look for a) attending/consulting [facility] provider listed and b) the [facility] team listed. Page or secure chat 7A-7P. Log into [email or web address] and use [facility]'s universal password to access. If you do not have the password, please contact the hospital operator. Locate the [facility] provider you are looking for under [facility] and page to a number that you can be directly reached. If you still have difficulty reaching the provider, please page the [facility] (Director on Call) for the Hospitalists listed on amion for assistance.

## 2024-01-05 NOTE — Anesthesia Preprocedure Evaluation (Signed)
 Anesthesia Evaluation  Patient identified by MRN, date of birth, ID band Patient awake    Reviewed: Allergy & Precautions, H&P , NPO status , Patient's Chart, lab work & pertinent test results  Airway Mallampati: II   Neck ROM: full    Dental   Pulmonary sleep apnea    breath sounds clear to auscultation       Cardiovascular hypertension,  Rhythm:regular Rate:Normal     Neuro/Psych  Neuromuscular disease    GI/Hepatic   Endo/Other    Renal/GU Renal diseasestones     Musculoskeletal   Abdominal   Peds  Hematology   Anesthesia Other Findings   Reproductive/Obstetrics                              Anesthesia Physical Anesthesia Plan  ASA: 2  Anesthesia Plan: General   Post-op Pain Management:    Induction: Intravenous  PONV Risk Score and Plan: 2 and Ondansetron , Dexamethasone, Midazolam and Treatment may vary due to age or medical condition  Airway Management Planned: LMA  Additional Equipment:   Intra-op Plan:   Post-operative Plan: Extubation in OR  Informed Consent: I have reviewed the patients History and Physical, chart, labs and discussed the procedure including the risks, benefits and alternatives for the proposed anesthesia with the patient or authorized representative who has indicated his/her understanding and acceptance.     Dental advisory given  Plan Discussed with: CRNA, Anesthesiologist and Surgeon  Anesthesia Plan Comments:         Anesthesia Quick Evaluation

## 2024-01-05 NOTE — Progress Notes (Signed)
   01/05/24 1033  TOC Brief Assessment  Insurance and Status Reviewed  Patient has primary care physician Yes  Home environment has been reviewed resides in privater residence  Prior level of function: Independent  Prior/Current Home Services No current home services  Social Drivers of Health Review SDOH reviewed no interventions necessary  Readmission risk has been reviewed Yes  Transition of care needs no transition of care needs at this time

## 2024-01-05 NOTE — Discharge Instructions (Signed)
   You may follow-up with Dr. Sherrilee in [place].  Give his office a call for scheduling.  If you prefer to follow-up with [facility] in [place], we are happy to see you here as well.  Definitions:  Ureter: The duct that transports urine from the kidney to the bladder. Stent:   A plastic hollow tube that is placed into the ureter, from the kidney to the                 bladder to prevent the ureter from swelling shut.  GENERAL INSTRUCTIONS:  Despite the fact that no skin incisions were used, the area around the ureter and bladder is raw and irritated. The stent is a foreign body which will further irritate the bladder wall. This irritation is manifested by increased frequency of urination, both day and night, and by an increase in the urge to urinate. In some, the urge to urinate is present almost always. Sometimes the urge is strong enough that you may not be able to stop yourself from urinating. The only real cure is to remove the stent and then give time for the bladder wall to heal which can't be done until the danger of the ureter swelling shut has passed, which varies.  You may see some blood in your urine while the stent is in place and a few days afterwards. Do not be alarmed, even if the urine was clear for a while. Get off your feet and drink lots of fluids until clearing occurs. If you start to pass clots or don't improve, call us .  DIET: You may return to your normal diet immediately. Because of the raw surface of your bladder, alcohol, spicy foods, acid type foods and drinks with caffeine may cause irritation or frequency and should be used in moderation. To keep your urine flowing freely and to avoid constipation, drink plenty of fluids during the day ( 8-10 glasses ). Tip: Avoid cranberry juice because it is very acidic.  ACTIVITY: Your physical activity doesn't need to be restricted. However, if you are very active, you may see some blood in your urine. We suggest that  you reduce your activity under these circumstances until the bleeding has stopped.  BOWELS: It is important to keep your bowels regular during the postoperative period. Straining with bowel movements can cause bleeding. A bowel movement every other day is reasonable. Use a mild laxative if needed, such as Milk of Magnesia 2-3 tablespoons, or 2 Dulcolax tablets. Call if you continue to have problems. If you have been taking narcotics for pain, before, during or after your surgery, you may be constipated. Take a laxative if necessary.   MEDICATION: You should resume your pre-surgery medications unless told not to. You may take oxybutynin or flomax  if prescribed for bladder spasms or discomfort from the stent Take pain medication as directed for pain refractory to conservative management  PROBLEMS YOU SHOULD REPORT TO US : Fevers over 100.5 Fahrenheit. Heavy bleeding, or clots ( See above notes about blood in urine ). Inability to urinate. Drug reactions ( hives, rash, nausea, vomiting, diarrhea ). Severe burning or pain with urination that is not improving.

## 2024-01-05 NOTE — Transfer of Care (Signed)
 Immediate Anesthesia Transfer of Care Note  Patient: Bryan Gilbert  Procedure(s) Performed: CYSTOSCOPY, WITH RETROGRADE PYELOGRAM AND URETERAL STENT INSERTION (Right)  Patient Location: PACU  Anesthesia Type:General  Level of Consciousness: drowsy  Airway & Oxygen Therapy: Patient Spontanous Breathing and Patient connected to face mask oxygen  Post-op Assessment: Report given to RN and Post -op Vital signs reviewed and stable  Post vital signs: Reviewed and stable  Last Vitals:  Vitals Value Taken Time  BP 122/100 01/05/24 08:54  Temp    Pulse 86 01/05/24 08:57  Resp 19 01/05/24 08:57  SpO2 97 % 01/05/24 08:57  Vitals shown include unfiled device data.  Last Pain:  Vitals:   01/05/24 0703  TempSrc:   PainSc: 1       Patients Stated Pain Goal: 5 (01/05/24 0703)  Complications: No notable events documented.

## 2024-01-05 NOTE — Plan of Care (Signed)

## 2024-01-05 NOTE — Anesthesia Procedure Notes (Signed)
 Procedure Name: LMA Insertion Date/Time: 01/05/2024 8:27 AM  Performed by: Deeann Eva BROCKS, CRNAPre-anesthesia Checklist: Patient identified, Emergency Drugs available, Suction available and Patient being monitored Patient Re-evaluated:Patient Re-evaluated prior to induction Oxygen Delivery Method: Circle System Utilized Preoxygenation: Pre-oxygenation with 100% oxygen Induction Type: IV induction Ventilation: Mask ventilation without difficulty LMA: LMA inserted LMA Size: 4.0 Number of attempts: 1 Airway Equipment and Method: Bite block Placement Confirmation: positive ETCO2 Tube secured with: Tape Dental Injury: Teeth and Oropharynx as per pre-operative assessment

## 2024-01-05 NOTE — Op Note (Signed)
 Operative Note  Preoperative diagnosis:  1.  Right ureteral calculus  Postoperative diagnosis: 1.  Right ureteral calculus  Procedure(s): 1.  Cystoscopy with right retrograde pyelogram, right ureteral stent placement  Surgeon: Sherwood Edison, MD  Assistants: None  Anesthesia: General  Complications: None immediate  EBL: Minimal  Specimens: 1.  None  Drains/Catheters: 1.  6 x 26 double-J ureteral stent  Intraoperative findings: 1.  Normal urethra 2.  Normal bladder mucosa without any tumors or masses  3.  Right retrograde pyelogram with filling defect to the level of the stone with upstream mild hydronephrosis  Indication: 58 year old male with right ureteral stone, leukocytosis, and low-grade fever presents for stent placement.  Description of procedure:  The patient was identified and consent was obtained.  The patient was taken to the operating room and placed in the supine position.  The patient was placed under general anesthesia.  Perioperative antibiotics were administered.  The patient was  Placed in dorsal lithotomy.  Patient was prepped and draped in a standard sterile fashion and a timeout was performed.  a 21 French rigid cystoscope was advanced into the urethra and into the bladder.  Complete cystoscopy was performed with findings noted above.  The right ureter was cannulated with an open-ended ureteral catheter and a retrograde pyelogram was performed with the findings noted above.  I advanced a sensor wire up the right ureter and into the kidney under fluoroscopic guidance followed by routine placement of a 6 x 26 double-J ureteral stent.  Fluoroscopy confirmed proximal placement and direct visualization confirmed a good coil within the bladder.  I drained the bladder and withdrew the scope.  Patient tolerated the procedure well was stable postoperatively.  Plan: Follow-up in 1 to 2 weeks with Dr. Sherrilee for ureteroscopy

## 2024-01-05 NOTE — Progress Notes (Signed)
     Patient Name: Bryan Gilbert           DOB: [birth year]  MRN: [id number]      Admission Date: 01/04/2024  Attending Provider: Vernon Ranks, MD  Primary Diagnosis: Nephrolithiasis   Level of care: Med-Surg   CROSS COVERAGE NOTE       Date of Service   01/05/2024   Bryan Gilbert, 58 y.o. male, was admitted on 01/04/2024 for Nephrolithiasis.    HPI/Events of Note   Severe pain related to abdominal migraine Sees [facility] neurologist (Dr. Evalene Collet) for migraines as well as [facility] clinic for chronic pain. Home meds include: Stadol  nasal spray, Flexeril , levorphanol, Norco. Wife verbalizes concerns that if pain is not well controlled, he will begin to have severe cyclic vomiting. No improvement after receiving 0.5 mg IV Dilaudid  and Stadol  nasal spray, c/o periumbilical abdominal pain 8/10 and dry heaving despite Zofran  use. Patient verbalizes Imitrex has not helped in the past. No improvement in the past after morphine , fentanyl . Tried Ketamine and reports having dissociative side effects. Wife states that Dilaudid  is the only medication that has helped with migraines while hospitalized.   Bedside Assessment:  Patient is A/O x4.  Appears to be resting comfortably in bed.  RN reports patient looks more relaxed. He has received 2 additional dilaudid  doses and IV phenergan , Toradol , robaxin. Currently receiving magnesium .  Does not appear to be in acute distress. Patient states pain is 7.5/10.  Respiratory: Bilaterally clear, no wheezing, no crackles. Normal effort. No accessory muscle use.  Cardiovascular: Regular rate and rhythm. No extremity edema. 2+ pedal pulses.  Abdomen: Abdomen is soft and nontender.  Positive bowel sounds in all quadrants.  No guarding or rebound tenderness.    Interventions/ Plan   Additional IV Dilaudid  for severe breakthrough pain.  Migraine cocktail - IV Phenergan  and IV Toradol . IV Robaxin 2 mg IV magnesium  Continue reassessing pain and nausea. If  no improvement, trial IVPB haldol . QTc 447.        Walda Hertzog, DNP, ACNPC- AG [facility] [place]

## 2024-01-05 NOTE — Progress Notes (Signed)
   01/05/24 2334  BiPAP/CPAP/SIPAP  $ Non-Invasive Ventilator  Non-Invasive Vent Set Up  $ Non-Invasive Home Ventilator  Initial  $ Face Mask Large  Yes  BiPAP/CPAP/SIPAP DREAMSTATIOND  Patient Home Machine No  Patient Home Mask No  Patient Home Tubing No  Auto Titrate Yes  CPAP/SIPAP surface wiped down Yes  BiPAP/CPAP /SiPAP Vitals  Pulse Rate 96  Resp (!) 22  SpO2 92 %  Bilateral Breath Sounds Clear;Diminished  MEWS Score/Color  MEWS Score 1  MEWS Score Color Green

## 2024-01-06 ENCOUNTER — Other Ambulatory Visit (HOSPITAL_COMMUNITY): Payer: Self-pay

## 2024-01-06 ENCOUNTER — Other Ambulatory Visit: Payer: Self-pay | Admitting: Urology

## 2024-01-06 ENCOUNTER — Encounter (HOSPITAL_COMMUNITY): Payer: Self-pay | Admitting: Urology

## 2024-01-06 DIAGNOSIS — N200 Calculus of kidney: Secondary | ICD-10-CM | POA: Diagnosis not present

## 2024-01-06 LAB — CBC WITH DIFFERENTIAL/PLATELET
Abs Immature Granulocytes: 0.14 10*3/uL — ABNORMAL HIGH (ref 0.00–0.07)
Basophils Absolute: 0 10*3/uL (ref 0.0–0.1)
Basophils Relative: 0 %
Eosinophils Absolute: 0 10*3/uL (ref 0.0–0.5)
Eosinophils Relative: 0 %
HCT: 52.8 % — ABNORMAL HIGH (ref 39.0–52.0)
Hemoglobin: 17.9 g/dL — ABNORMAL HIGH (ref 13.0–17.0)
Immature Granulocytes: 1 %
Lymphocytes Relative: 15 %
Lymphs Abs: 2.4 10*3/uL (ref 0.7–4.0)
MCH: 29.7 pg (ref 26.0–34.0)
MCHC: 33.9 g/dL (ref 30.0–36.0)
MCV: 87.7 fL (ref 80.0–100.0)
Monocytes Absolute: 0.9 10*3/uL (ref 0.1–1.0)
Monocytes Relative: 6 %
Neutro Abs: 12.3 10*3/uL — ABNORMAL HIGH (ref 1.7–7.7)
Neutrophils Relative %: 78 %
Platelets: 300 10*3/uL (ref 150–400)
RBC: 6.02 MIL/uL — ABNORMAL HIGH (ref 4.22–5.81)
RDW: 12.3 % (ref 11.5–15.5)
WBC: 15.8 10*3/uL — ABNORMAL HIGH (ref 4.0–10.5)
nRBC: 0 % (ref 0.0–0.2)

## 2024-01-06 LAB — BASIC METABOLIC PANEL WITH GFR
Anion gap: 12 (ref 5–15)
BUN: 26 mg/dL — ABNORMAL HIGH (ref 6–20)
CO2: 21 mmol/L — ABNORMAL LOW (ref 22–32)
Calcium: 8.7 mg/dL — ABNORMAL LOW (ref 8.9–10.3)
Chloride: 105 mmol/L (ref 98–111)
Creatinine, Ser: 0.97 mg/dL (ref 0.61–1.24)
GFR, Estimated: >60 mL/min (ref >60)
Glucose, Bld: 268 mg/dL — ABNORMAL HIGH (ref 70–99)
Potassium: 4.2 mmol/L (ref 3.5–5.1)
Sodium: 138 mmol/L (ref 135–145)

## 2024-01-06 LAB — URINE CULTURE: Culture: NO GROWTH

## 2024-01-06 LAB — GLUCOSE, CAPILLARY
Glucose-Capillary: 283 mg/dL — ABNORMAL HIGH (ref 70–99)
Glucose-Capillary: 210 mg/dL — ABNORMAL HIGH (ref 70–99)

## 2024-01-06 MED ORDER — PEN NEEDLES 31G X 5 MM MISC
1.0000 | Freq: Three times a day (TID) | 0 refills | Status: DC
  Filled 2024-01-06: qty 100, 30d supply, fill #0

## 2024-01-06 MED ORDER — LISINOPRIL-HYDROCHLOROTHIAZIDE 10-12.5 MG PO TABS: 1.0000 | ORAL_TABLET | Freq: Every day | ORAL | Status: DC

## 2024-01-06 MED ORDER — SODIUM CHLORIDE 0.9 % IV SOLN
25.0000 mg | Freq: Four times a day (QID) | INTRAVENOUS | Status: DC | PRN
  Administered 2024-01-06: 25 mg via INTRAVENOUS
  Filled 2024-01-06: qty 25

## 2024-01-06 MED ORDER — LANCET DEVICE MISC
1.0000 | Freq: Three times a day (TID) | 0 refills | Status: AC
  Filled 2024-01-06: qty 1, fill #0

## 2024-01-06 MED ORDER — HYDROCHLOROTHIAZIDE 12.5 MG PO TABS
12.5000 mg | ORAL_TABLET | Freq: Every day | ORAL | Status: DC
  Administered 2024-01-06: 12.5 mg via ORAL
  Filled 2024-01-06: qty 1

## 2024-01-06 MED ORDER — LANCETS MISC
1.0000 | Freq: Three times a day (TID) | 0 refills | Status: AC
  Filled 2024-01-06: qty 100, 30d supply, fill #0

## 2024-01-06 MED ORDER — INSULIN STARTER KIT- PEN NEEDLES (ENGLISH)
1.0000 | Freq: Once | Status: AC
  Administered 2024-01-06: 1
  Filled 2024-01-06: qty 1

## 2024-01-06 MED ORDER — BLOOD GLUCOSE TEST VI STRP
1.0000 | ORAL_STRIP | Freq: Three times a day (TID) | 0 refills | Status: AC
  Filled 2024-01-06: qty 100, 34d supply, fill #0

## 2024-01-06 MED ORDER — LISINOPRIL 10 MG PO TABS
10.0000 mg | ORAL_TABLET | Freq: Every day | ORAL | Status: DC
  Administered 2024-01-06: 10 mg via ORAL
  Filled 2024-01-06: qty 1

## 2024-01-06 MED ORDER — INSULIN ASPART 100 UNIT/ML FLEXPEN
0.0000 [IU] | PEN_INJECTOR | Freq: Three times a day (TID) | SUBCUTANEOUS | 0 refills | Status: DC
  Filled 2024-01-06: qty 15, 30d supply, fill #0

## 2024-01-06 MED ORDER — SULFAMETHOXAZOLE-TRIMETHOPRIM 800-160 MG PO TABS
1.0000 | ORAL_TABLET | Freq: Two times a day (BID) | ORAL | 0 refills | Status: AC
  Filled 2024-01-06: qty 16, 8d supply, fill #0

## 2024-01-06 MED ORDER — INSULIN GLARGINE 100 UNIT/ML SOLOSTAR PEN
20.0000 [IU] | PEN_INJECTOR | Freq: Every day | SUBCUTANEOUS | 0 refills | Status: DC
  Filled 2024-01-06: qty 15, 75d supply, fill #0

## 2024-01-06 MED ORDER — BLOOD GLUCOSE MONITOR SYSTEM W/DEVICE KIT
1.0000 | PACK | Freq: Three times a day (TID) | 0 refills | Status: AC
  Filled 2024-01-06: qty 1, 30d supply, fill #0

## 2024-01-06 NOTE — Discharge Summary (Signed)
 Physician Discharge Summary  Bryan Gilbert FMW:[id number] DOB: January 10, 1966 DOA: 01/04/2024  PCP: Alphonsa Glendia LABOR, MD  Admit date: 01/04/2024 Discharge date: 01/06/2024 30 Day Unplanned Readmission Risk Score    Flowsheet Row ED to Hosp-Admission (Current) from 01/04/2024 in [facility] [place] [facility]  30 Day Unplanned Readmission Risk Score (%) 22.37 Filed at 01/06/2024 0801    This score is the patient's risk of an unplanned readmission within 30 days of being discharged (0 -100%). The score is based on dignosis, age, lab data, medications, orders, and past utilization.   Low:  0-14.9   Medium: 15-21.9   High: 22-29.9   Extreme: 30 and above          Admitted From: Home Disposition: Home  Recommendations for Outpatient Follow-up:  Follow up with PCP in 1-2 weeks Please obtain BMP/CBC in one week Follow-up with urology in 2 weeks Please follow up with your PCP on the following pending results: Unresulted Labs (From admission, onward)     Start     Ordered   01/05/24 0748  Urine Culture (for pregnant, neutropenic or urologic patients or patients with an indwelling urinary catheter)  (Urine Labs)  Once,   R       Question:  Indication  Answer:  Sepsis   01/05/24 0747              Home Health: None Equipment/Devices: None  Discharge Condition: Stable CODE STATUS: Full code Diet recommendation: Diabetic and cardiac  Subjective: Seen and examined, pain is improved.  He wants to go home.  Urology PA also see him along with me.  They cleared him for discharge.  Details below.  Brief/Interim Summary: Bryan Gilbert is a 58 y.o. male with medical history significant of OSA on CPAP, abdominal migraine, hypertension, chronic back pain and history of kidney stone who presents to the emergency department due to right flank pain which started this morning around 6:30 AM, pain was rated as 8/10 on pain scale.  He also complained of dizziness and nausea as well as difficulty in being able to  urinate.   ED Course:  In the emergency department, patient was tachycardic, BP was 171/115, other vital signs were within normal range.  Workup in the ED showed WBC of 15.9, hemoglobin 19.9, hematocrit 50.1, MCV 85.7, platelets 331.  BMP was normal except for bicarb of 18, blood glucose 371, BUN 17, creatinine 1.35, AST 51, ALT 80, ALP 82.  Urinalysis was positive for glycosuria and hematuria CT abdomen and pelvis without contrast showed obstructing 4 x 6 mm stone in the mid right ureter with mild proximal hydroureteronephrosis and perinephric stranding. He was treated with IV Dilaudid , IV fentanyl , Zofran , Compazine , Protonix .  IV NS 2L was provided. Urologist (Dr. Carolee) was consulted and recommended admitting patient to [facility] with plan to possibly place a stent in the morning.  IV ceftriaxone was given.  TRH was asked to admit patient   Assessment & Plan:   Principal Problem:   Nephrolithiasis Active Problems:   Essential hypertension   Abdominal migraine   Chronic back pain   Leukocytosis   AKI (acute kidney injury) (HCC)   Dehydration   Nausea and vomiting   Obstructive nephrolithiasis/right-sided hydroureteronephrosis/sepsis secondary to right-sided pyelonephritis, POA: UA indicative of UTI.  CT scan also shows right pyelonephritis.  Patient met criteria for sepsis based on tachycardia and leukocytosis.  Lactic acid normal.  Urology on board, patient underwent cystoscopy with right retrograde pyelogram and  right ureteral stent placement by Dr. Carolee on 01/05/2024.  Urine and blood culture in process and negative thus far.  Patient is afebrile, leukocytosis improved.  Pain is improved.  Seen by urology, patient desires to go home and urology is okay with him going home.  They recommended discharging on 8 days of Bactrim DS.  They will follow-up with him as outpatient.   AKI: Baseline creatinine around 0.8-1.  Presented 1.35 which resolved.  AKI likely secondary to ATN in the setting of  sepsis.   Nausea and vomiting: Secondary to sepsis.  Resolved.   Mild metabolic acidosis: CO2 19 yesterday.  Improved with IV fluids.   Type 2 diabetes mellitus, uncontrolled with hyperglycemia: Last hemoglobin A1c in chart from February 2025 was 8.2.  Patient does not appear to be on any medications for diabetes.  Significantly hyperglycemic here.  He says that his PCP told him that he has prediabetes.  Hemoglobin A1c at this time is 9.4.  He was managed with combination of long as well as short acting insulin while inpatient.  Seen by diabetes coordinator/educator, they recommended discharging on 20 units of Lantus Solostar pen and resistant scale sliding scale insulin.  Patient has been educated on injecting insulin to himself.   OSA on CPAP: Continue CPAP.   Essential hypertension: Blood pressure slightly elevated since we were holding his lisinopril  and hydrochlorothiazide  due to AKI, both of these medications have been resumed.   Chronic back pain: Resume home medications.   Abdominal migraine: Following documentation on 01/05/2024.   Wife at the bedside explains that patient was diagnosed with abdominal migraine by neurologist at [facility] several years ago.  Patient is complaining of abdominal pain, 7 out of 10, located at periumbilical area.  Does not complain of right flank pain.  Is nauseous.  Has received Zofran  just 15 minutes ago.  If no improvement in 30 minutes, will try Phenergan .  Wife insisted that neurologist has said that once the pain kicks in like he does have now, they recommend Dilaudid  2 mg followed by another 2 mg 30 minutes and followed by another 2 mg an hour after that and to monitor him closely for respiratory pression.  Had a lengthy discussion with the wife that these are really high dose medications and we are unable to do that as his pain is only 7 out of 10.  She further told me that patient is on Stadol  nasal spray every 4 hours as needed which I have resumed per her  request.  He is also on Flexeril  which also have resumed.  However patient is also on levorphanol 3 mg every 6 hours as needed and Norco 10/325 mg to be 4 hours as needed but wife claims that patient takes combination of both but takes levorphanol 4.5 mg instead of 3 mg.  Again, discussed with the wife, these are really heavy medications.  Patient already has 1 mg Dilaudid  ordered by urology which we are going to give him along with his home dose of Norco and see how he does.  Wife understands that. Patient did not require any extra dosages of Dilaudid .  He is completely pain-free at the moment.  Discharge plan was discussed with patient and/or family member and they verbalized understanding and agreed with it.  Discharge Diagnoses:  Principal Problem:   Nephrolithiasis Active Problems:   Essential hypertension   Abdominal migraine   Chronic back pain   Leukocytosis   AKI (acute kidney injury) (HCC)   Dehydration  Nausea and vomiting    Discharge Instructions  Discharge Instructions     Amb Referral to Nutrition and Diabetic Education   Complete by: As directed       Allergies as of 01/06/2024       Reactions   Cefprozil  Diarrhea, Nausea Only   Intestinal upset and diarrhea   Lipitor [atorvastatin] Other (See Comments)   Myalgia   Xtandi [enzalutamide] Other (See Comments)   GI upset        Medication List     TAKE these medications    Blood Glucose Monitor System w/Device Kit Use to check blood sugar as directed   BLOOD GLUCOSE TEST STRIPS Strp Use to check blood sugar three times daily   butorphanol  10 MG/ML nasal spray Commonly known as: STADOL  Place 1 spray into the nose every 4 (four) hours as needed for headache or migraine.   cyclobenzaprine  10 MG tablet Commonly known as: FLEXERIL  Take 10 mg by mouth 3 (three) times daily as needed for muscle spasms.   HYDROcodone -acetaminophen  10-325 MG tablet Commonly known as: NORCO Take 1 tablet by mouth 4  (four) times daily as needed for moderate pain (pain score 4-6).   insulin aspart 100 UNIT/ML FlexPen Commonly known as: NOVOLOG Inject 0-18 Units into the skin 3 (three) times daily with meals. Check Blood Glucose (BG) and inject per scale: BG <150= 0 unit; BG 150-200= 3 unit; BG 201-250= 6 unit; BG 251-300= 9 unit; BG 301-350= 12 unit; BG 351-400= 15 unit; BG >400= 18 unit and Call Primary Care.   insulin glargine 100 UNIT/ML Solostar Pen Commonly known as: LANTUS Inject 20 Units into the skin at bedtime.   Jatenzo  237 MG Caps Generic drug: Testosterone  Undecanoate Take 1 capsule (237 mg total) by mouth 2 (two) times daily.   Lancet Device Misc 1 each by Does not apply route 3 (three) times daily. May dispense any manufacturer covered by patient's insurance.   Lancets Misc Use to check blood sugar three times daily as directed   levorphanol 2 MG tablet Commonly known as: LEVODROMORAN Take 3 mg by mouth every 6 (six) hours as needed for pain.   lisinopril -hydrochlorothiazide  10-12.5 MG tablet Commonly known as: ZESTORETIC  Take 1 tablet by mouth daily.   loperamide  2 MG capsule Commonly known as: IMODIUM  Take 2 mg by mouth as needed for diarrhea or loose stools.   meloxicam  15 MG tablet Commonly known as: MOBIC  TAKE 1 TABLET (15 MG TOTAL) BY MOUTH DAILY.   ondansetron  8 MG disintegrating tablet Commonly known as: ZOFRAN -ODT Take 8 mg by mouth every 8 (eight) hours as needed for nausea or vomiting.   Pen Needles 31G X 5 MM Misc Use to inject insulin as directed   pseudoephedrine 120 MG 12 hr tablet Commonly known as: SUDAFED Take 120 mg by mouth daily as needed for congestion.   QUEtiapine  25 MG tablet Commonly known as: SEROQUEL  Take 25 mg by mouth every evening.   SALINE NASAL SPRAY NA Place 1 spray into the nose daily as needed (dryness). Reported on 09/17/2015   sertraline  50 MG tablet Commonly known as: ZOLOFT  TAKE 1 TABLET BY MOUTH EVERY DAY   simethicone   80 MG chewable tablet Commonly known as: MYLICON Chew 80 mg by mouth every 6 (six) hours as needed for flatulence.   sulfamethoxazole-trimethoprim 800-160 MG tablet Commonly known as: Bactrim DS Take 1 tablet by mouth 2 (two) times daily for 8 days.   tamsulosin  0.4 MG Caps capsule Commonly known  as: FLOMAX  Take 0.4 mg by mouth daily after supper.   Testosterone  20.25 MG/ACT (1.62%) Gel APPLY 3 PUMP TOPICALLY DAILY.   vitamin B-12 100 MCG tablet Commonly known as: CYANOCOBALAMIN Take 100 mcg by mouth daily.         Follow-up Information     Alphonsa Glendia LABOR, MD Follow up in 1 week(s).   Specialty: Family Medicine Contact information: [address] [place] [address] [phone]                Allergies  Allergen Reactions   Cefprozil  Diarrhea and Nausea Only    Intestinal upset and diarrhea   Lipitor [Atorvastatin] Other (See Comments)    Myalgia   Xtandi [Enzalutamide] Other (See Comments)    GI upset    Consultations: Urology   Procedures/Studies: DG C-Arm 1-60 Min-No Report Result Date: 01/05/2024 Fluoroscopy was utilized by the requesting physician.  No radiographic interpretation.   CT Renal Stone Study Result Date: 01/04/2024 CLINICAL DATA:  Abdominal/flank pain, stone suspected right flank pain EXAM: CT ABDOMEN AND PELVIS WITHOUT CONTRAST TECHNIQUE: Multidetector CT imaging of the abdomen and pelvis was performed following the standard protocol without IV contrast. RADIATION DOSE REDUCTION: This exam was performed according to the departmental dose-optimization program which includes automated exposure control, adjustment of the mA and/or kV according to patient size and/or use of iterative reconstruction technique. COMPARISON:  CT 04/18/2023 FINDINGS: Lower chest: Clear lung bases. Hepatobiliary: The liver is enlarged spanning 19.3 cm cranial caudal with diffuse steatosis. No evidence of focal liver abnormality on this unenhanced exam. The  gallbladder is partially distended. No calcified gallstone or pericholecystic inflammation. Stable biliary tree. Pancreas: Fatty atrophy.  No ductal dilatation or inflammation. Spleen: No splenomegaly.  No focal abnormality. Adrenals/Urinary Tract: No adrenal nodule. Obstructing 4 x 6 mm stone in the mid right ureter (at the L4 level) with mild proximal hydroureteronephrosis and perinephric stranding. Simple cyst in the upper right kidney. No further follow-up imaging is recommended. Two 5 mm nonobstructing stones in the left kidney. No left hydronephrosis. Unremarkable urinary bladder. Stomach/Bowel: No bowel obstruction or inflammation. Moderate colonic stool burden. Normal appendix. Tiny hiatal hernia. Vascular/Lymphatic: Normal caliber abdominal aorta. Shotty periportal and portal caval nodes are likely reactive. No enlarged lymph nodes in the abdomen or pelvis. Reproductive: Prostate is unremarkable. Other: No free air, free fluid, or intra-abdominal fluid collection. Musculoskeletal: There are no acute or suspicious osseous abnormalities. L5-S1 degenerative disc disease IMPRESSION: 1. Obstructing 4 x 6 mm stone in the mid right ureter with mild proximal hydroureteronephrosis and perinephric stranding. 2. Nonobstructing left nephrolithiasis. 3. Hepatomegaly with hepatic steatosis. Electronically Signed   By: Andrea Gasman M.D.   On: 01/04/2024 16:48     Discharge Exam: Vitals:   01/06/24 0624 01/06/24 0951  BP: (!) 161/84 (!) 141/104  Pulse: 96 89  Resp: 18 17  Temp: 98.2 F (36.8 C) (!) 97.4 F (36.3 C)  SpO2: 91% 95%   Vitals:   01/05/24 2334 01/06/24 0128 01/06/24 0624 01/06/24 0951  BP:  (!) 171/89 (!) 161/84 (!) 141/104  Pulse: 96 95 96 89  Resp: (!) 22 19 18 17   Temp:  98 F (36.7 C) 98.2 F (36.8 C) (!) 97.4 F (36.3 C)  TempSrc:  Oral Oral Oral  SpO2: 92% 95% 91% 95%  Weight:      Height:        General: Pt is alert, awake, not in acute distress Cardiovascular: RRR,  S1/S2 +,  no rubs, no gallops Respiratory: CTA bilaterally, no wheezing, no rhonchi Abdominal: Soft, NT, ND, bowel sounds + Extremities: no edema, no cyanosis    The results of significant diagnostics from this hospitalization (including imaging, microbiology, ancillary and laboratory) are listed below for reference.     Microbiology: Recent Results (from the past 240 hours)  Culture, blood (Routine X 2) w Reflex to ID Panel     Status: None (Preliminary result)   Collection Time: 01/05/24 10:08 AM   Specimen: BLOOD LEFT ARM  Result Value Ref Range Status   Specimen Description   Final    BLOOD LEFT ARM Performed at [facility], [address] [place], [place] [address]    Special Requests   Final    BOTTLES DRAWN AEROBIC ONLY Blood Culture results may not be optimal due to an inadequate volume of blood received in culture bottles Performed at [facility], [address] [place], [place] [address]    Culture   Final    NO GROWTH < 24 HOURS Performed at [facility], [address], [place], [place] [address]    Report Status PENDING  Incomplete  Culture, blood (Routine X 2) w Reflex to ID Panel     Status: None (Preliminary result)   Collection Time: 01/05/24 10:09 AM   Specimen: BLOOD RIGHT ARM  Result Value Ref Range Status   Specimen Description   Final    BLOOD RIGHT ARM Performed at [facility], [address] [place], [place] [address]    Special Requests   Final    BOTTLES DRAWN AEROBIC ONLY Blood Culture results may not be optimal due to an inadequate volume of blood received in culture bottles Performed at [facility], [address], [place], [place] [address]    Culture   Final    NO GROWTH < 24 HOURS Performed at [facility], [address], [place], [place] [address]    Report Status PENDING  Incomplete     Labs: BNP (last 3 results) No results for input(s): BNP in the last 8760  hours. Basic Metabolic Panel: Recent Labs  Lab 01/04/24 1352 01/04/24 1609 01/05/24 1008 01/05/24 1653 01/06/24 0443  NA 136 137 139 139 138  K 4.3 4.8 4.1 3.9 4.2  CL 100 104 106 103 105  CO2 18* 19* 21* 23 21*  GLUCOSE 371* 344* 166* 328* 268*  BUN 17 17 24* 23* 26*  CREATININE 1.35* 1.24 0.86 1.01 0.97  CALCIUM  9.2 8.2* 8.6* 9.1 8.7*  MG  --   --  2.1  --   --   PHOS  --   --  1.8*  --   --    Liver Function Tests: Recent Labs  Lab 01/04/24 1352 01/05/24 1008  AST 51* 32  ALT 80* 53*  ALKPHOS 82 65  BILITOT 1.4* 1.5*  PROT 8.2* 7.2  ALBUMIN 4.4 3.7   Recent Labs  Lab 01/04/24 1352  LIPASE 22   No results for input(s): AMMONIA in the last 168 hours. CBC: Recent Labs  Lab 01/04/24 1352 01/05/24 1008 01/05/24 1009 01/06/24 0443  WBC 15.9* 11.6* DUP 15.8*  NEUTROABS 13.9* 8.8* PENDING 12.3*  HGB 19.9* 18.0* DUP 17.9*  HCT 58.1* 54.0* DUP 52.8*  MCV 85.7 90.0 DUP 87.7  PLT 331 251 DUP 300   Cardiac Enzymes: No results for input(s): CKTOTAL, CKMB, CKMBINDEX, TROPONINI in the last 168 hours. BNP: Invalid  input(s): POCBNP CBG: Recent Labs  Lab 01/05/24 1034 01/05/24 1701 01/05/24 2145 01/06/24 0720 01/06/24 1112  GLUCAP 228* 374* 196* 283* 210*   D-Dimer No results for input(s): DDIMER in the last 72 hours. Hgb A1c Recent Labs    01/05/24 1008  HGBA1C 9.4*   Lipid Profile No results for input(s): CHOL, HDL, LDLCALC, TRIG, CHOLHDL, LDLDIRECT in the last 72 hours. Thyroid function studies No results for input(s): TSH, T4TOTAL, T3FREE, THYROIDAB in the last 72 hours.  Invalid input(s): FREET3 Anemia work up No results for input(s): VITAMINB12, FOLATE, FERRITIN, TIBC, IRON, RETICCTPCT in the last 72 hours. Urinalysis    Component Value Date/Time   COLORURINE YELLOW 01/04/2024 1836   APPEARANCEUR HAZY (A) 01/04/2024 1836   APPEARANCEUR Clear 12/18/2022 1242   LABSPEC 1.022 01/04/2024 1836    PHURINE 5.0 01/04/2024 1836   GLUCOSEU >=500 (A) 01/04/2024 1836   HGBUR LARGE (A) 01/04/2024 1836   BILIRUBINUR NEGATIVE 01/04/2024 1836   BILIRUBINUR Negative 12/18/2022 1242   KETONESUR 20 (A) 01/04/2024 1836   PROTEINUR NEGATIVE 01/04/2024 1836   UROBILINOGEN 0.2 05/19/2013 1732   NITRITE NEGATIVE 01/04/2024 1836   LEUKOCYTESUR NEGATIVE 01/04/2024 1836   Sepsis Labs Recent Labs  Lab 01/04/24 1352 01/05/24 1008 01/05/24 1009 01/06/24 0443  WBC 15.9* 11.6* DUP 15.8*   Microbiology Recent Results (from the past 240 hours)  Culture, blood (Routine X 2) w Reflex to ID Panel     Status: None (Preliminary result)   Collection Time: 01/05/24 10:08 AM   Specimen: BLOOD LEFT ARM  Result Value Ref Range Status   Specimen Description   Final    BLOOD LEFT ARM Performed at [facility], [address] [place], [place] [address]    Special Requests   Final    BOTTLES DRAWN AEROBIC ONLY Blood Culture results may not be optimal due to an inadequate volume of blood received in culture bottles Performed at [facility], [address] [place], [place] [address]    Culture   Final    NO GROWTH < 24 HOURS Performed at [facility], [address] [place], [place] [address]    Report Status PENDING  Incomplete  Culture, blood (Routine X 2) w Reflex to ID Panel     Status: None (Preliminary result)   Collection Time: 01/05/24 10:09 AM   Specimen: BLOOD RIGHT ARM  Result Value Ref Range Status   Specimen Description   Final    BLOOD RIGHT ARM Performed at [facility], [address], [place], [place] [address]    Special Requests   Final    BOTTLES DRAWN AEROBIC ONLY Blood Culture results may not be optimal due to an inadequate volume of blood received in culture bottles Performed at [facility], [address] [place], [place] [address]    Culture   Final    NO GROWTH < 24 HOURS Performed at [facility], [address] [place], [place] [address]    Report Status PENDING  Incomplete    FURTHER DISCHARGE INSTRUCTIONS:   Get Medicines reviewed and adjusted: Please take all your medications with you for your next visit with your Primary MD   Laboratory/radiological data: Please request your Primary MD to go over all hospital tests and procedure/radiological results at the follow up, please ask your Primary MD to get all Hospital records sent to his/her office.   In some cases, they will be blood  work, cultures and biopsy results pending at the time of your discharge. Please request that your primary care M.D. goes through all the records of your hospital data and follows up on these results.   Also Note the following: If you experience worsening of your admission symptoms, develop shortness of breath, life threatening emergency, suicidal or homicidal thoughts you must seek medical attention immediately by calling 911 or calling your MD immediately  if symptoms less severe.   You must read complete instructions/literature along with all the possible adverse reactions/side effects for all the Medicines you take and that have been prescribed to you. Take any new Medicines after you have completely understood and accpet all the possible adverse reactions/side effects.    patient was instructed, not to drive, operate heavy machinery, perform activities at heights, swimming or participation in water activities or provide baby-sitting services while on Pain, Sleep and Anxiety Medications; until their outpatient Physician has advised to do so again. Also recommended to not to take more than prescribed Pain, Sleep and Anxiety Medications.  It is not advisable to combine anxiety, sleep and pain medications without talking with your primary care provider.     Wear Seat belts while driving.   Please note: You were cared for by a hospitalist during your hospital stay. Once you are discharged, your primary care  physician will handle any further medical issues. Please note that NO REFILLS for any discharge medications will be authorized once you are discharged, as it is imperative that you return to your primary care physician (or establish a relationship with a primary care physician if you do not have one) for your post hospital discharge needs so that they can reassess your need for medications and monitor your lab values  Time coordinating discharge: Over 30 minutes  SIGNED:   Fredia Skeeter, MD  [facility] 01/06/2024, 11:15 AM *Please note that this is a verbal dictation therefore any spelling or grammatical errors are due to the Dragon [employer] One system interpretation. If 7PM-7AM, please contact night-coverage [email or web address]

## 2024-01-06 NOTE — Plan of Care (Signed)
 ?  Problem: Clinical Measurements: ?Goal: Will remain free from infection ?Outcome: Progressing ?  ?

## 2024-01-06 NOTE — Progress Notes (Signed)
 1 Day Post-Op Subjective: First time meeting patient.  He looks well this morning and has no specific urologic complaints.  We briefly reviewed the case, plan, and next steps.  I was accompanied by the hospitalist and his primary issue is surrounding his previously undiagnosed diabetes.    Objective: Vital signs in last 24 hours: Temp:  [97.4 F (36.3 C)-98.8 F (37.1 C)] 97.4 F (36.3 C) (07/03 0951) Pulse Rate:  [64-96] 89 (07/03 0951) Resp:  [16-22] 17 (07/03 0951) BP: (141-171)/(84-104) 141/104 (07/03 0951) SpO2:  [91 %-99 %] 95 % (07/03 0951)  Assessment/Plan: 58 year old male newly diagnosed diabetic s/p ureteral stent placement with Dr. Carolee on 01/05/2024.  # Right ureteral stone # T2DM-new Ambulatory referral placed to follow-up in 1-2 weeks with Dr. Sherrilee for ureteroscopy. Patient to discharge after insulin administration education and meeting with diabetes counselor  Intake/Output from previous day: 07/02 0701 - 07/03 0700 In: 1198.3 [P.O.:340; I.V.:698; IV Piggyback:160.3] Out: 100 [Urine:100]  Intake/Output this shift: Total I/O In: 120 [P.O.:120] Out: -   Physical Exam:  General: Alert and oriented CV: No cyanosis Lungs: equal chest rise Abdomen: Soft, NTND, no rebound or guarding   Lab Results: Recent Labs    01/05/24 1008 01/05/24 1009 01/06/24 0443  HGB 18.0* DUP 17.9*  HCT 54.0* DUP 52.8*   BMET Recent Labs    01/05/24 1009 01/05/24 1653 01/06/24 0443  NA  --  139 138  K  --  3.9 4.2  CL  --  103 105  CO2  --  23 21*  GLUCOSE  --  328* 268*  BUN  --  23* 26*  CREATININE  --  1.01 0.97  CALCIUM   --  9.1 8.7*  HGB DUP  --  17.9*  WBC DUP  --  15.8*     Studies/Results: DG C-Arm 1-60 Min-No Report Result Date: 01/05/2024 Fluoroscopy was utilized by the requesting physician.  No radiographic interpretation.   CT Renal Stone Study Result Date: 01/04/2024 CLINICAL DATA:  Abdominal/flank pain, stone suspected right flank pain EXAM:  CT ABDOMEN AND PELVIS WITHOUT CONTRAST TECHNIQUE: Multidetector CT imaging of the abdomen and pelvis was performed following the standard protocol without IV contrast. RADIATION DOSE REDUCTION: This exam was performed according to the departmental dose-optimization program which includes automated exposure control, adjustment of the mA and/or kV according to patient size and/or use of iterative reconstruction technique. COMPARISON:  CT 04/18/2023 FINDINGS: Lower chest: Clear lung bases. Hepatobiliary: The liver is enlarged spanning 19.3 cm cranial caudal with diffuse steatosis. No evidence of focal liver abnormality on this unenhanced exam. The gallbladder is partially distended. No calcified gallstone or pericholecystic inflammation. Stable biliary tree. Pancreas: Fatty atrophy.  No ductal dilatation or inflammation. Spleen: No splenomegaly.  No focal abnormality. Adrenals/Urinary Tract: No adrenal nodule. Obstructing 4 x 6 mm stone in the mid right ureter (at the L4 level) with mild proximal hydroureteronephrosis and perinephric stranding. Simple cyst in the upper right kidney. No further follow-up imaging is recommended. Two 5 mm nonobstructing stones in the left kidney. No left hydronephrosis. Unremarkable urinary bladder. Stomach/Bowel: No bowel obstruction or inflammation. Moderate colonic stool burden. Normal appendix. Tiny hiatal hernia. Vascular/Lymphatic: Normal caliber abdominal aorta. Shotty periportal and portal caval nodes are likely reactive. No enlarged lymph nodes in the abdomen or pelvis. Reproductive: Prostate is unremarkable. Other: No free air, free fluid, or intra-abdominal fluid collection. Musculoskeletal: There are no acute or suspicious osseous abnormalities. L5-S1 degenerative disc disease IMPRESSION: 1. Obstructing 4  x 6 mm stone in the mid right ureter with mild proximal hydroureteronephrosis and perinephric stranding. 2. Nonobstructing left nephrolithiasis. 3. Hepatomegaly with hepatic  steatosis. Electronically Signed   By: Andrea Gasman M.D.   On: 01/04/2024 16:48      LOS: 2 days   Ole Bourdon, NP [facility] Pager: [phone]  01/06/2024, 9:59 AM

## 2024-01-10 ENCOUNTER — Telehealth: Payer: Self-pay | Admitting: *Deleted

## 2024-01-10 LAB — CULTURE, BLOOD (ROUTINE X 2)
Culture: NO GROWTH
Culture: NO GROWTH

## 2024-01-10 NOTE — Transitions of Care (Post Inpatient/ED Visit) (Signed)
   01/10/2024  Name: Bryan Gilbert MRN: [id number] DOB: 08-22-1965  Today's TOC FU Call Status: Today's TOC FU Call Status:: Unsuccessful Call (1st Attempt) Unsuccessful Call (1st Attempt) Date: 01/10/24  Attempted to reach the patient regarding the most recent Inpatient/ED visit.  Follow Up Plan: Additional outreach attempts will be made to reach the patient to complete the Transitions of Care (Post Inpatient/ED visit) call.   Mliss Creed [facility], BSN RN Care Manager/ Transition of Care [place]/ [facility] [phone]

## 2024-01-11 ENCOUNTER — Telehealth: Payer: Self-pay | Admitting: *Deleted

## 2024-01-11 NOTE — Transitions of Care (Post Inpatient/ED Visit) (Signed)
   01/11/2024  Name: Bryan Gilbert MRN: [id number] DOB: 1965-09-03  Today's TOC FU Call Status: Today's TOC FU Call Status:: Successful TOC FU Call Completed TOC FU Call Complete Date: 01/11/24 Patient's Name and Date of Birth confirmed.  Transition Care Management Follow-up Telephone Call Date of Discharge: 01/06/24 Discharge Facility: [facility]) Type of Discharge: Inpatient Admission Primary Inpatient Discharge Diagnosis:: Nephrolithiasis How have you been since you were released from the hospital?:  (spoke with patient who states  I'm doing fine, I don't want to talk  pt declines phone call today and does not want further outreach) Any questions or concerns?: No     Mliss Creed [facility], BSN RN Care Manager/ Transition of Care [place]/ [facility] [phone]

## 2024-01-12 ENCOUNTER — Other Ambulatory Visit: Payer: Self-pay

## 2024-01-12 ENCOUNTER — Encounter (HOSPITAL_COMMUNITY)
Admission: RE | Admit: 2024-01-12 | Discharge: 2024-01-12 | Disposition: A | Discharge status: Home / Self Care | Source: Ambulatory Visit | Attending: Urology | Admitting: Urology

## 2024-01-13 ENCOUNTER — Ambulatory Visit (HOSPITAL_COMMUNITY): Admitting: Anesthesiology

## 2024-01-13 ENCOUNTER — Ambulatory Visit (HOSPITAL_BASED_OUTPATIENT_CLINIC_OR_DEPARTMENT_OTHER): Admitting: Anesthesiology

## 2024-01-13 ENCOUNTER — Ambulatory Visit (HOSPITAL_COMMUNITY)

## 2024-01-13 ENCOUNTER — Encounter (HOSPITAL_COMMUNITY)
Admission: RE | Disposition: A | Discharge status: Home / Self Care | Payer: Self-pay | Source: Home / Self Care | Attending: Urology

## 2024-01-13 ENCOUNTER — Ambulatory Visit (HOSPITAL_COMMUNITY)
Admission: RE | Admit: 2024-01-13 | Discharge: 2024-01-13 | Disposition: A | Discharge status: Home / Self Care | Attending: Urology | Admitting: Urology

## 2024-01-13 ENCOUNTER — Encounter (HOSPITAL_COMMUNITY): Payer: Self-pay | Admitting: Urology

## 2024-01-13 DIAGNOSIS — G4730 Sleep apnea, unspecified: Secondary | ICD-10-CM | POA: Diagnosis not present

## 2024-01-13 DIAGNOSIS — N201 Calculus of ureter: Secondary | ICD-10-CM | POA: Diagnosis present

## 2024-01-13 DIAGNOSIS — G709 Myoneural disorder, unspecified: Secondary | ICD-10-CM | POA: Diagnosis not present

## 2024-01-13 DIAGNOSIS — I10 Essential (primary) hypertension: Secondary | ICD-10-CM | POA: Diagnosis not present

## 2024-01-13 DIAGNOSIS — Z8249 Family history of ischemic heart disease and other diseases of the circulatory system: Secondary | ICD-10-CM | POA: Diagnosis not present

## 2024-01-13 HISTORY — PX: CYSTOSCOPY/RETROGRADE/URETEROSCOPY/STONE EXTRACTION WITH BASKET: SHX5317

## 2024-01-13 HISTORY — PX: HOLMIUM LASER APPLICATION: SHX5852

## 2024-01-13 HISTORY — PX: CYSTOSCOPY/URETEROSCOPY/HOLMIUM LASER/STENT PLACEMENT: SHX6546

## 2024-01-13 LAB — GLUCOSE, CAPILLARY
Glucose-Capillary: 250 mg/dL — ABNORMAL HIGH (ref 70–99)
Glucose-Capillary: 217 mg/dL — ABNORMAL HIGH (ref 70–99)

## 2024-01-13 SURGERY — CYSTOSCOPY/URETEROSCOPY/HOLMIUM LASER/STENT PLACEMENT
Anesthesia: General | Site: Ureter | Laterality: Right

## 2024-01-13 MED ORDER — ONDANSETRON HCL 4 MG/2ML IJ SOLN
INTRAMUSCULAR | Status: AC
  Administered 2024-01-13: 4 mg
  Filled 2024-01-13: qty 2

## 2024-01-13 MED ORDER — ONDANSETRON HCL 4 MG/2ML IJ SOLN
4.0000 mg | Freq: Once | INTRAMUSCULAR | Status: AC
  Administered 2024-01-13: 4 mg via INTRAVENOUS

## 2024-01-13 MED ORDER — FENTANYL CITRATE PF 50 MCG/ML IJ SOSY
PREFILLED_SYRINGE | INTRAMUSCULAR | Status: AC
  Administered 2024-01-13: 50 ug
  Filled 2024-01-13: qty 1

## 2024-01-13 MED ORDER — LACTATED RINGERS IV SOLN: INTRAVENOUS | Status: DC

## 2024-01-13 MED ORDER — DIATRIZOATE MEGLUMINE 30 % UR SOLN
URETHRAL | Status: DC | PRN
  Administered 2024-01-13: 10 mL via URETHRAL

## 2024-01-13 MED ORDER — PROPOFOL 10 MG/ML IV BOLUS
INTRAVENOUS | Status: AC
  Filled 2024-01-13: qty 20

## 2024-01-13 MED ORDER — CEFAZOLIN SODIUM-DEXTROSE 2-4 GM/100ML-% IV SOLN
INTRAVENOUS | Status: AC
  Filled 2024-01-13: qty 100

## 2024-01-13 MED ORDER — FENTANYL CITRATE PF 50 MCG/ML IJ SOSY: 50.0000 ug | PREFILLED_SYRINGE | Freq: Once | INTRAMUSCULAR | Status: DC

## 2024-01-13 MED ORDER — FENTANYL CITRATE PF 50 MCG/ML IJ SOSY: 25.0000 ug | PREFILLED_SYRINGE | INTRAMUSCULAR | Status: DC | PRN

## 2024-01-13 MED ORDER — ONDANSETRON 8 MG PO TBDP: 8.0000 mg | ORAL_TABLET | Freq: Three times a day (TID) | ORAL | 0 refills | Status: AC | PRN

## 2024-01-13 MED ORDER — DIATRIZOATE MEGLUMINE 30 % UR SOLN
URETHRAL | Status: AC
  Filled 2024-01-13: qty 100

## 2024-01-13 MED ORDER — ORAL CARE MOUTH RINSE: 15.0000 mL | Freq: Once | OROMUCOSAL | Status: DC

## 2024-01-13 MED ORDER — CHLORHEXIDINE GLUCONATE 0.12 % MT SOLN: 15.0000 mL | Freq: Once | OROMUCOSAL | Status: DC

## 2024-01-13 MED ORDER — LIDOCAINE 2% (20 MG/ML) 5 ML SYRINGE
INTRAMUSCULAR | Status: DC | PRN
  Administered 2024-01-13: 100 mg via INTRAVENOUS

## 2024-01-13 MED ORDER — TAMSULOSIN HCL 0.4 MG PO CAPS: 0.4000 mg | ORAL_CAPSULE | Freq: Every day | ORAL | 0 refills | Status: DC

## 2024-01-13 MED ORDER — MIDAZOLAM HCL 2 MG/2ML IJ SOLN
INTRAMUSCULAR | Status: AC
  Filled 2024-01-13: qty 2

## 2024-01-13 MED ORDER — MIDAZOLAM HCL 2 MG/2ML IJ SOLN
INTRAMUSCULAR | Status: DC | PRN
Start: 2024-01-13 — End: 2024-01-13
  Administered 2024-01-13: 2 mg via INTRAVENOUS

## 2024-01-13 MED ORDER — OXYCODONE-ACETAMINOPHEN 5-325 MG PO TABS: 1.0000 | ORAL_TABLET | ORAL | 0 refills | Status: AC | PRN

## 2024-01-13 MED ORDER — OXYCODONE HCL 5 MG PO TABS
5.0000 mg | ORAL_TABLET | Freq: Once | ORAL | Status: AC | PRN
  Administered 2024-01-13: 5 mg via ORAL
  Filled 2024-01-13: qty 1

## 2024-01-13 MED ORDER — SODIUM CHLORIDE 0.9 % IV SOLN
12.5000 mg | Freq: Once | INTRAVENOUS | Status: AC
  Administered 2024-01-13: 12.5 mg via INTRAVENOUS
  Filled 2024-01-13: qty 0.5

## 2024-01-13 MED ORDER — FENTANYL CITRATE (PF) 100 MCG/2ML IJ SOLN
INTRAMUSCULAR | Status: DC | PRN
  Administered 2024-01-13: 100 ug via INTRAVENOUS

## 2024-01-13 MED ORDER — FENTANYL CITRATE (PF) 100 MCG/2ML IJ SOLN
INTRAMUSCULAR | Status: AC
  Filled 2024-01-13: qty 2

## 2024-01-13 MED ORDER — ONDANSETRON HCL 4 MG/2ML IJ SOLN: 4.0000 mg | Freq: Once | INTRAMUSCULAR | Status: DC | PRN

## 2024-01-13 MED ORDER — CEFAZOLIN SODIUM-DEXTROSE 2-4 GM/100ML-% IV SOLN
2.0000 g | INTRAVENOUS | Status: AC
  Administered 2024-01-13: 2 g via INTRAVENOUS

## 2024-01-13 MED ORDER — OXYCODONE HCL 5 MG/5ML PO SOLN: 5.0000 mg | Freq: Once | ORAL | Status: AC | PRN

## 2024-01-13 MED ORDER — WATER FOR IRRIGATION, STERILE IR SOLN
Status: DC | PRN
  Administered 2024-01-13: 1000 mL

## 2024-01-13 MED ORDER — PROPOFOL 10 MG/ML IV BOLUS
INTRAVENOUS | Status: DC | PRN
Start: 2024-01-13 — End: 2024-01-13
  Administered 2024-01-13: 200 mg via INTRAVENOUS

## 2024-01-13 MED ORDER — DEXMEDETOMIDINE HCL IN NACL 80 MCG/20ML IV SOLN
INTRAVENOUS | Status: DC | PRN
  Administered 2024-01-13: 20 ug via INTRAVENOUS

## 2024-01-13 MED ORDER — SODIUM CHLORIDE 0.9 % IR SOLN
Status: DC | PRN
Start: 2024-01-13 — End: 2024-01-13
  Administered 2024-01-13: 3000 mL

## 2024-01-13 SURGICAL SUPPLY — 21 items
BAG DRAIN URO TABLE W/ADPT NS (BAG) ×2 IMPLANT
BAG HAMPER (MISCELLANEOUS) ×2 IMPLANT
CLOTH BEACON ORANGE TIMEOUT ST (SAFETY) ×2 IMPLANT
EXTRACTOR STONE NITINOL NGAGE (UROLOGICAL SUPPLIES) ×2 IMPLANT
GLOVE BIO SURGEON STRL SZ8 (GLOVE) ×2 IMPLANT
GLOVE BIOGEL PI IND STRL 7.0 (GLOVE) ×4 IMPLANT
GOWN STRL REUS W/TWL LRG LVL3 (GOWN DISPOSABLE) ×2 IMPLANT
GOWN STRL REUS W/TWL XL LVL3 (GOWN DISPOSABLE) ×2 IMPLANT
GUIDEWIRE ANG ZIPWIRE 038X150 (WIRE) ×2 IMPLANT
GUIDEWIRE STR DUAL SENSOR (WIRE) ×2 IMPLANT
KIT TURNOVER CYSTO (KITS) ×2 IMPLANT
MANIFOLD NEPTUNE II (INSTRUMENTS) ×2 IMPLANT
PACK CYSTO (CUSTOM PROCEDURE TRAY) ×2 IMPLANT
PAD ARMBOARD POSITIONER FOAM (MISCELLANEOUS) ×2 IMPLANT
POSITIONER HEAD 8X9X4 ADT (SOFTGOODS) ×2 IMPLANT
SOL .9 NS 3000ML IRR UROMATIC (IV SOLUTION) ×4 IMPLANT
STENT URET 6FRX26 CONTOUR (STENTS) IMPLANT
SYR 10ML LL (SYRINGE) ×2 IMPLANT
TOWEL OR 17X26 4PK STRL BLUE (TOWEL DISPOSABLE) ×2 IMPLANT
TRACTIP FLEXIVA PULS ID 200XHI (Laser) IMPLANT
WATER STERILE IRR 500ML POUR (IV SOLUTION) ×2 IMPLANT

## 2024-01-13 NOTE — Anesthesia Preprocedure Evaluation (Signed)
 Anesthesia Evaluation  Patient identified by MRN, date of birth, ID band Patient awake    Reviewed: Allergy & Precautions, H&P , NPO status , Patient's Chart, lab work & pertinent test results, reviewed documented beta blocker date and time   Airway Mallampati: II  TM Distance: >3 FB Neck ROM: full    Dental no notable dental hx.    Pulmonary sleep apnea    Pulmonary exam normal breath sounds clear to auscultation       Cardiovascular Exercise Tolerance: Good hypertension,  Rhythm:regular Rate:Normal     Neuro/Psych  PSYCHIATRIC DISORDERS       Neuromuscular disease    GI/Hepatic negative GI ROS, Neg liver ROS,,,  Endo/Other  negative endocrine ROS    Renal/GU Renal disease  negative genitourinary   Musculoskeletal   Abdominal   Peds  Hematology negative hematology ROS (+)   Anesthesia Other Findings   Reproductive/Obstetrics negative OB ROS                              Anesthesia Physical Anesthesia Plan  ASA: 2  Anesthesia Plan: General and General LMA   Post-op Pain Management:    Induction:   PONV Risk Score and Plan: Ondansetron   Airway Management Planned:   Additional Equipment:   Intra-op Plan:   Post-operative Plan:   Informed Consent: I have reviewed the patients History and Physical, chart, labs and discussed the procedure including the risks, benefits and alternatives for the proposed anesthesia with the patient or authorized representative who has indicated his/her understanding and acceptance.     Dental Advisory Given  Plan Discussed with: CRNA  Anesthesia Plan Comments:         Anesthesia Quick Evaluation

## 2024-01-13 NOTE — Interval H&P Note (Signed)
 History and Physical Interval Note:  01/13/2024 9:01 AM  Bryan Gilbert  has presented today for surgery, with the diagnosis of right ureteral stone.  The various methods of treatment have been discussed with the patient and family. After consideration of risks, benefits and other options for treatment, the patient has consented to  Procedure(s): CYSTOSCOPY/URETEROSCOPY/HOLMIUM LASER/STENT PLACEMENT (Right) HOLMIUM LASER APPLICATION (Right) as a surgical intervention.  The patient's history has been reviewed, patient examined, no change in status, stable for surgery.  I have reviewed the patient's chart and labs.  Questions were answered to the patient's satisfaction.     [name]

## 2024-01-13 NOTE — Transfer of Care (Signed)
 Immediate Anesthesia Transfer of Care Note  Patient: Bryan Gilbert  Procedure(s) Performed: CYSTOSCOPY/URETEROSCOPY/HOLMIUM LASER/STENT PLACEMENT (Right: Ureter) HOLMIUM LASER APPLICATION (Right) CYSTOSCOPY, WITH CALCULUS REMOVAL USING BASKET (Right: Ureter)  Patient Location: PACU  Anesthesia Type:General  Level of Consciousness: sedated and responds to stimulation  Airway & Oxygen Therapy: Patient Spontanous Breathing and Patient connected to face mask oxygen  Post-op Assessment: Report given to RN, Post -op Vital signs reviewed and stable, and Patient moving all extremities X 4  Post vital signs: Reviewed and stable  Last Vitals:  Vitals Value Taken Time  BP 128/106 01/13/24 11:30  Temp 98.7 1130  Pulse 98 01/13/24 11:31  Resp 13 01/13/24 11:31  SpO2 100 % 01/13/24 11:31  Vitals shown include unfiled device data.  Last Pain:  Vitals:   01/13/24 0900  PainSc: 3          Complications: No notable events documented.

## 2024-01-13 NOTE — Op Note (Signed)
 Preoperative diagnosis: Right renal stone  Postoperative diagnosis: Same  Procedure: 1 cystoscopy 2. Right retrograde pyelography 3.  Intraoperative fluoroscopy, under one hour, with interpretation 4.  Right ureteroscopic stone manipulation with laser lithotripsy 5.  right 6 x 26 JJ stent placement  Attending: Belvie Standing  Anesthesia: General  Estimated blood loss: None  Drains: Right 6 x 26 JJ ureteral stent with tether  Specimens: stone for analysis  Antibiotics: ancef   Findings: Right UPJ stone. No hydronephrosis. No masses/lesions in the bladder. Ureteral orifices in normal anatomic location.  Indications: Patient is a 58 year old male with a history of right ureteral stone who underwent stent placement for sepsis 1 week ago. After discussing treatment options, he decided proceed with right ureteroscopic stone manipulation.  Procedure in detail: The patient was brought to the operating room and a brief timeout was done to ensure correct patient, correct procedure, correct site.  General anesthesia was administered patient was placed in dorsal lithotomy position.  His genitalia was then prepped and draped in usual sterile fashion.  A rigid 22 French cystoscope was passed in the urethra and the bladder.  Bladder was inspected free masses or lesions.  the ureteral orifices were in the normal orthotopic locations.  a 6 french ureteral catheter was then instilled into the right ureteral orifice.  a gentle retrograde was obtained and findings noted above. Using a grasper the right ureteral stent was brought to the urethral meatus.  we then placed a zip wire through the ureteral stent and advanced up to the renal pelvis.  The stent was then removed we then removed the cystoscope and cannulated the right ureteral orifice with a semirigid ureteroscope.  No stone was found in the ureter. Once we reached the UPJ a sensor wire was advanced in to the renal pelvis. We then advanced a flexible  ureteroscope over the wire and up to the renal pelvis. We then used the flexible ureteroscope to perform nephroscopy. We encountered the stone at the UPJ. Using a 242nm laser fiber the stone was fragmented and the fragments were removed with a Ngage basket.    once all stone fragments were removed we then placed a 6 x 26 double-j ureteral stent over the original zip wire.  We then removed the wire and good coil was noted in the the renal pelvis under fluoroscopy and the bladder under direct vision. the bladder was then drained and this concluded the procedure which was well tolerated by patient.  Complications: None  Condition: Stable, extubated, transferred to PACU  Plan: Patient is to be discharged home as to follow-up in one week. She is to remove his stent in 72 hours by pulling the tether

## 2024-01-13 NOTE — Anesthesia Procedure Notes (Signed)
 Procedure Name: LMA Insertion Date/Time: 01/13/2024 10:33 AM  Performed by: Cordella Elvie HERO, CRNAPre-anesthesia Checklist: Patient identified, Emergency Drugs available, Suction available, Patient being monitored and Timeout performed Patient Re-evaluated:Patient Re-evaluated prior to induction Oxygen Delivery Method: Circle system utilized Preoxygenation: Pre-oxygenation with 100% oxygen Induction Type: IV induction LMA: LMA inserted LMA Size: 4.0 Number of attempts: 1 Placement Confirmation: positive ETCO2, CO2 detector and breath sounds checked- equal and bilateral Tube secured with: Tape Dental Injury: Teeth and Oropharynx as per pre-operative assessment

## 2024-01-14 ENCOUNTER — Encounter (HOSPITAL_COMMUNITY): Payer: Self-pay | Admitting: Urology

## 2024-01-14 NOTE — Anesthesia Postprocedure Evaluation (Signed)
 Anesthesia Post Note  Patient: Bryan Gilbert  Procedure(s) Performed: CYSTOSCOPY/URETEROSCOPY/HOLMIUM LASER/STENT PLACEMENT (Right: Ureter) HOLMIUM LASER APPLICATION (Right) CYSTOSCOPY, WITH CALCULUS REMOVAL USING BASKET (Right: Ureter)  Patient location during evaluation: Phase II Anesthesia Type: General Level of consciousness: awake Pain management: pain level controlled Vital Signs Assessment: post-procedure vital signs reviewed and stable Respiratory status: spontaneous breathing and respiratory function stable Cardiovascular status: blood pressure returned to baseline and stable Postop Assessment: no headache and no apparent nausea or vomiting Anesthetic complications: no Comments: Late entry   No notable events documented.   Last Vitals:  Vitals:   01/13/24 1211 01/13/24 1229  BP: 132/81 (!) 159/87  Pulse:  82  Resp: 15 16  Temp:  36.4 C  SpO2: 100% 97%    Last Pain:  Vitals:   01/14/24 1250  TempSrc:   PainSc: 0-No pain                 Yvonna JINNY Bosworth

## 2024-01-24 ENCOUNTER — Other Ambulatory Visit: Payer: Self-pay | Admitting: Family Medicine

## 2024-01-24 ENCOUNTER — Encounter: Payer: Self-pay | Admitting: Family Medicine

## 2024-01-24 ENCOUNTER — Other Ambulatory Visit: Payer: Self-pay | Admitting: Urology

## 2024-01-24 LAB — STONE ANALYSIS
Calcium Oxalate Monohydrate: 100 %
Weight Calculi: 23 mg

## 2024-01-26 ENCOUNTER — Ambulatory Visit (INDEPENDENT_AMBULATORY_CARE_PROVIDER_SITE_OTHER): Admitting: Urology

## 2024-01-26 VITALS — BP 129/87 | HR 109

## 2024-01-26 DIAGNOSIS — N200 Calculus of kidney: Secondary | ICD-10-CM

## 2024-01-26 LAB — URINALYSIS, ROUTINE W REFLEX MICROSCOPIC
Bilirubin, UA: NEGATIVE
Glucose, UA: NEGATIVE
Nitrite, UA: NEGATIVE
Specific Gravity, UA: 1.025 (ref 1.005–1.030)
Urobilinogen, Ur: 4 mg/dL — ABNORMAL HIGH (ref 0.2–1.0)
pH, UA: 6.5 (ref 5.0–7.5)

## 2024-01-26 LAB — MICROSCOPIC EXAMINATION
Bacteria, UA: NONE SEEN
RBC, Urine: >30 /HPF — AB (ref 0–2)

## 2024-01-26 MED ORDER — JATENZO 237 MG PO CAPS: 1.0000 | ORAL_CAPSULE | Freq: Two times a day (BID) | ORAL | 5 refills | Status: DC

## 2024-01-26 MED ORDER — SULFAMETHOXAZOLE-TRIMETHOPRIM 800-160 MG PO TABS: 1.0000 | ORAL_TABLET | Freq: Two times a day (BID) | ORAL | 0 refills | Status: DC

## 2024-01-26 NOTE — Progress Notes (Signed)
 01/26/2024 8:35 AM   Bryan Gilbert Aug 22, 1965 [id number]  Referring provider: Alphonsa Gilbert LABOR, MD [address] [place],  [place] [address]  nephrolithiasis   HPI: Bryan Gilbert is a 58yo here for followup for nephrolithiasis. He underwent ureteroscopy 1 week ago and has not removed his tethered stent yet. He denies any flank pain. He has urinary frequency, urgency and mild dysuria. No other complaints today   PMH: Past Medical History:  Diagnosis Date   Abdominal migraine    Cervical neuralgia    Cyclical vomiting    Elevated hemoglobin (HCC)    Hypertension    Renal disorder    kidney stone   Sleep apnea    states uses O2 and CPAP at night   Slipped intervertebral disc     Surgical History: Past Surgical History:  Procedure Laterality Date   CYSTOSCOPY W/ URETERAL STENT PLACEMENT Right 01/05/2024   Procedure: CYSTOSCOPY, WITH RETROGRADE PYELOGRAM AND URETERAL STENT INSERTION;  Surgeon: Bryan Sherwood JONETTA DOUGLAS, MD;  Location: [facility];  Service: Urology;  Laterality: Right;   CYSTOSCOPY/RETROGRADE/URETEROSCOPY/STONE EXTRACTION WITH BASKET Right 01/13/2024   Procedure: CYSTOSCOPY, WITH CALCULUS REMOVAL USING BASKET;  Surgeon: Bryan Belvie CROME, MD;  Location: [facility];  Service: Urology;  Laterality: Right;   CYSTOSCOPY/URETEROSCOPY/HOLMIUM LASER/STENT PLACEMENT Right 01/13/2024   Procedure: CYSTOSCOPY/URETEROSCOPY/HOLMIUM LASER/STENT PLACEMENT;  Surgeon: Bryan Belvie CROME, MD;  Location: [facility];  Service: Urology;  Laterality: Right;   ESOPHAGOGASTRODUODENOSCOPY     HOLMIUM LASER APPLICATION Right 01/13/2024   Procedure: HOLMIUM LASER APPLICATION;  Surgeon: Bryan Belvie CROME, MD;  Location: [facility];  Service: Urology;  Laterality: Right;    Home Medications:  Allergies as of 01/26/2024       Reactions   Cefprozil  Diarrhea, Nausea Only   Intestinal upset and diarrhea   Lipitor [atorvastatin] Other (See Comments)   Myalgia   Xtandi [enzalutamide] Other (See Comments)    GI upset        Medication List        Accurate as of January 26, 2024  8:35 AM. If you have any questions, ask your nurse or doctor.          Accu-Chek Guide Test test strip Generic drug: glucose blood Use to check blood sugar three times daily   Accu-Chek Guide w/Device Kit Use to check blood sugar as directed   Accu-Chek Softclix Lancets lancets Use to check blood sugar three times daily as directed   BD Pen Needle Mini Ultrafine 31G X 5 MM Misc Generic drug: Insulin  Pen Needle Use to inject insulin  as directed   butorphanol  10 MG/ML nasal spray Commonly known as: STADOL  Place 1 spray into the nose every 4 (four) hours as needed for headache or migraine.   cyclobenzaprine  10 MG tablet Commonly known as: FLEXERIL  Take 10 mg by mouth 3 (three) times daily as needed for muscle spasms.   HYDROcodone -acetaminophen  10-325 MG tablet Commonly known as: NORCO Take 1 tablet by mouth 4 (four) times daily as needed for moderate pain (pain score 4-6).   Jatenzo  237 MG Caps Generic drug: Testosterone  Undecanoate TAKE 1 CAPSULE (237 MG TOTAL) BY MOUTH 2 (TWO) TIMES DAILY.   Lantus  SoloStar 100 UNIT/ML Solostar Pen Generic drug: insulin  glargine Inject 20 Units into the skin at bedtime.   levorphanol 2 MG tablet Commonly known as: LEVODROMORAN Take 3 mg by mouth every 6 (six) hours as needed for pain.   lisinopril -hydrochlorothiazide  10-12.5 MG tablet Commonly known as: ZESTORETIC  Take 1  tablet by mouth daily.   loperamide  2 MG capsule Commonly known as: IMODIUM  Take 2 mg by mouth as needed for diarrhea or loose stools.   meloxicam  15 MG tablet Commonly known as: MOBIC  TAKE 1 TABLET (15 MG TOTAL) BY MOUTH DAILY.   NovoLOG  FlexPen 100 UNIT/ML FlexPen Generic drug: insulin  aspart Inject 0-18 Units into the skin 3 (three) times daily with meals. Check Blood Glucose (BG) and inject per scale: BG <150= 0 unit; BG 150-200= 3 unit; BG 201-250= 6 unit; BG 251-300= 9 unit;  BG 301-350= 12 unit; BG 351-400= 15 unit; BG >400= 18 unit and Call Primary Care.   ondansetron  8 MG disintegrating tablet Commonly known as: ZOFRAN -ODT Take 1 tablet (8 mg total) by mouth every 8 (eight) hours as needed for nausea or vomiting.   OneTouch Delica Plus Lancing Misc 1 each by Does not apply route 3 (three) times daily. May dispense any manufacturer covered by patient's insurance.   oxyCODONE -acetaminophen  5-325 MG tablet Commonly known as: Percocet Take 1 tablet by mouth every 4 (four) hours as needed for severe pain (pain score 7-10).   pseudoephedrine 120 MG 12 hr tablet Commonly known as: SUDAFED Take 120 mg by mouth daily as needed for congestion.   QUEtiapine  25 MG tablet Commonly known as: SEROQUEL  Take 25 mg by mouth every evening.   SALINE NASAL SPRAY NA Place 1 spray into the nose daily as needed (dryness). Reported on 09/17/2015   sertraline  50 MG tablet Commonly known as: ZOLOFT  TAKE 1 TABLET BY MOUTH EVERY DAY   simethicone  80 MG chewable tablet Commonly known as: MYLICON Chew 80 mg by mouth every 6 (six) hours as needed for flatulence.   tamsulosin  0.4 MG Caps capsule Commonly known as: FLOMAX  Take 1 capsule (0.4 mg total) by mouth daily after supper.   Testosterone  20.25 MG/ACT (1.62%) Gel APPLY 3 PUMP TOPICALLY DAILY.   vitamin B-12 100 MCG tablet Commonly known as: CYANOCOBALAMIN Take 100 mcg by mouth daily.        Allergies:  Allergies  Allergen Reactions   Cefprozil  Diarrhea and Nausea Only    Intestinal upset and diarrhea   Lipitor [Atorvastatin] Other (See Comments)    Myalgia   Xtandi [Enzalutamide] Other (See Comments)    GI upset    Family History: Family History  Problem Relation Age of Onset   Heart failure Father    Hypertension Father     Social History:  reports that he has never smoked. He has never used smokeless tobacco. He reports current alcohol use. He reports that he does not use drugs.  ROS: All other  review of systems were reviewed and are negative except what is noted above in HPI  Physical Exam: BP 129/87   Pulse (!) 109   Constitutional:  Alert and oriented, No acute distress. HEENT: [place] AT, moist mucus membranes.  Trachea midline, no masses. Cardiovascular: No clubbing, cyanosis, or edema. Respiratory: Normal respiratory effort, no increased work of breathing. GI: Abdomen is soft, nontender, nondistended, no abdominal masses GU: No CVA tenderness.  Lymph: No cervical or inguinal lymphadenopathy. Skin: No rashes, bruises or suspicious lesions. Neurologic: Grossly intact, no focal deficits, moving all 4 extremities. Psychiatric: Normal mood and affect.  Laboratory Data: Lab Results  Component Value Date   WBC 15.8 (H) 01/06/2024   HGB 17.9 (H) 01/06/2024   HCT 52.8 (H) 01/06/2024   MCV 87.7 01/06/2024   PLT 300 01/06/2024    Lab Results  Component Value Date  CREATININE 0.97 01/06/2024    No results found for: PSA  Lab Results  Component Value Date   TESTOSTERONE  612 12/03/2023    Lab Results  Component Value Date   HGBA1C 9.4 (H) 01/05/2024    Urinalysis    Component Value Date/Time   COLORURINE YELLOW 01/04/2024 1836   APPEARANCEUR HAZY (A) 01/04/2024 1836   APPEARANCEUR Clear 12/18/2022 1242   LABSPEC 1.022 01/04/2024 1836   PHURINE 5.0 01/04/2024 1836   GLUCOSEU >=500 (A) 01/04/2024 1836   HGBUR LARGE (A) 01/04/2024 1836   BILIRUBINUR NEGATIVE 01/04/2024 1836   BILIRUBINUR Negative 12/18/2022 1242   KETONESUR 20 (A) 01/04/2024 1836   PROTEINUR NEGATIVE 01/04/2024 1836   UROBILINOGEN 0.2 05/19/2013 1732   NITRITE NEGATIVE 01/04/2024 1836   LEUKOCYTESUR NEGATIVE 01/04/2024 1836    Lab Results  Component Value Date   LABMICR 37.5 08/11/2023   BACTERIA RARE (A) 01/04/2024    Pertinent Imaging:  No results found for this or any previous visit.  No results found for this or any previous visit.  No results found for this or any previous  visit.  No results found for this or any previous visit.  No results found for this or any previous visit.  No results found for this or any previous visit.  No results found for this or any previous visit.  Results for orders placed during the hospital encounter of 01/04/24  CT Renal Stone Study  Narrative CLINICAL DATA:  Abdominal/flank pain, stone suspected right flank pain  EXAM: CT ABDOMEN AND PELVIS WITHOUT CONTRAST  TECHNIQUE: Multidetector CT imaging of the abdomen and pelvis was performed following the standard protocol without IV contrast.  RADIATION DOSE REDUCTION: This exam was performed according to the departmental dose-optimization program which includes automated exposure control, adjustment of the mA and/or kV according to patient size and/or use of iterative reconstruction technique.  COMPARISON:  CT 04/18/2023  FINDINGS: Lower chest: Clear lung bases.  Hepatobiliary: The liver is enlarged spanning 19.3 cm cranial caudal with diffuse steatosis. No evidence of focal liver abnormality on this unenhanced exam. The gallbladder is partially distended. No calcified gallstone or pericholecystic inflammation. Stable biliary tree.  Pancreas: Fatty atrophy.  No ductal dilatation or inflammation.  Spleen: No splenomegaly.  No focal abnormality.  Adrenals/Urinary Tract: No adrenal nodule. Obstructing 4 x 6 mm stone in the mid right ureter (at the L4 level) with mild proximal hydroureteronephrosis and perinephric stranding. Simple cyst in the upper right kidney. No further follow-up imaging is recommended. Two 5 mm nonobstructing stones in the left kidney. No left hydronephrosis. Unremarkable urinary bladder.  Stomach/Bowel: No bowel obstruction or inflammation. Moderate colonic stool burden. Normal appendix. Tiny hiatal hernia.  Vascular/Lymphatic: Normal caliber abdominal aorta. Shotty periportal and portal caval nodes are likely reactive. No  enlarged lymph nodes in the abdomen or pelvis.  Reproductive: Prostate is unremarkable.  Other: No free air, free fluid, or intra-abdominal fluid collection.  Musculoskeletal: There are no acute or suspicious osseous abnormalities. L5-S1 degenerative disc disease  IMPRESSION: 1. Obstructing 4 x 6 mm stone in the mid right ureter with mild proximal hydroureteronephrosis and perinephric stranding. 2. Nonobstructing left nephrolithiasis. 3. Hepatomegaly with hepatic steatosis.   Electronically Signed By: Andrea Gasman M.D. On: 01/04/2024 16:48   Assessment & Plan:    1. Nephrolithiasis (Primary) Followup 3 months with renal US    No follow-ups on file.  [name], MD  [facility] [place]

## 2024-01-27 ENCOUNTER — Other Ambulatory Visit: Payer: Self-pay

## 2024-01-27 DIAGNOSIS — Z794 Long term (current) use of insulin: Secondary | ICD-10-CM

## 2024-01-27 MED ORDER — ACCU-CHEK SOFTCLIX LANCETS MISC: 1.0000 | Freq: Three times a day (TID) | 2 refills | Status: AC

## 2024-01-27 MED ORDER — PEN NEEDLES 31G X 5 MM MISC: 1.0000 | Freq: Three times a day (TID) | 2 refills | Status: DC

## 2024-01-27 MED ORDER — ACCU-CHEK GUIDE TEST VI STRP: 1.0000 | ORAL_STRIP | Freq: Three times a day (TID) | 2 refills | Status: DC

## 2024-02-02 ENCOUNTER — Encounter: Payer: Self-pay | Admitting: Urology

## 2024-02-02 NOTE — Patient Instructions (Signed)

## 2024-02-04 ENCOUNTER — Other Ambulatory Visit: Payer: Self-pay | Admitting: Urology

## 2024-02-09 ENCOUNTER — Encounter: Payer: Self-pay | Admitting: Family Medicine

## 2024-02-09 ENCOUNTER — Ambulatory Visit: Payer: No Typology Code available for payment source | Admitting: Family Medicine

## 2024-02-09 VITALS — BP 120/82 | HR 88 | Temp 97.3°F | Ht 67.0 in | Wt 224.0 lb

## 2024-02-09 DIAGNOSIS — I10 Essential (primary) hypertension: Secondary | ICD-10-CM | POA: Diagnosis not present

## 2024-02-09 DIAGNOSIS — E785 Hyperlipidemia, unspecified: Secondary | ICD-10-CM

## 2024-02-09 DIAGNOSIS — G43D1 Abdominal migraine, intractable: Secondary | ICD-10-CM

## 2024-02-09 DIAGNOSIS — E1169 Type 2 diabetes mellitus with other specified complication: Secondary | ICD-10-CM

## 2024-02-09 DIAGNOSIS — Z794 Long term (current) use of insulin: Secondary | ICD-10-CM

## 2024-02-09 DIAGNOSIS — E119 Type 2 diabetes mellitus without complications: Secondary | ICD-10-CM

## 2024-02-09 MED ORDER — QUETIAPINE FUMARATE 25 MG PO TABS
25.0000 mg | ORAL_TABLET | Freq: Every day | ORAL | 1 refills | Status: AC
Start: 2024-02-09 — End: ?

## 2024-02-09 MED ORDER — TIRZEPATIDE 2.5 MG/0.5ML ~~LOC~~ SOAJ: 2.5000 mg | SUBCUTANEOUS | 5 refills | Status: DC

## 2024-02-09 NOTE — Progress Notes (Signed)
   Subjective:    Patient ID: Bryan Gilbert, male    DOB: 12/12/1965, 58 y.o.   MRN: [id number]  HPI 6 month follow up  Diagnosed with diabetes in hospital while there for kidney stones Needs refills for both insulin   Patient recently in the hospital Had significant issues including hyperglycemia as well as stone issues.  Being managed by urology Hospitalist put him on insulin  Patient has a history of obesity as well as carbohydrate rich diet as well as cyclical vomiting syndrome and abdominal migraines Is under the care of neurology at [facility] and they have him on Stadol  for his abdominal migraines Patient did have an elevated A1c earlier this year and at that time did not want to go on medications All of this got worse and he is now willing to be on medicine so therefore they have him currently on insulin    Review of Systems     Objective:   Physical Exam General-in no acute distress Eyes-no discharge Lungs-respiratory rate normal, CTA CV-no murmurs,RRR Extremities skin warm dry no edema Neuro grossly normal Behavior normal, alert        Assessment & Plan:  1. Primary hypertension (Primary) Blood pressure decent control continue current measures  2. Morbid obesity (HCC) Portion control regular physical activity healthy diet  3. Intractable abdominal migraine Managed by specialist under good control currently  4. Diabetes mellitus without complication (HCC) On insulin  Patient will need continuous glucose monitor He does not want one that works through his smart phone We will connect with clinical pharmacist Parameters of glucose and diabetes control were discussed in detail Warning signs regarding low sugars discussed detail  Patient also a reasonable candidate for GLP-1's but because of his abdominal migraines he is at a higher risk that he could have side effects Side effects discussed No history of abnormal thyroid cancers No family history of thyroid  cancers Patient is willing to try GLP-1's and is desiring to do so He will keep us  updated regarding side effects   5. Hyperlipidemia associated with type 2 diabetes mellitus (HCC) Patient did not tolerate statins Is interested in other measures We did discuss Praluent as well as Repatha but patient is going to look into these and let us  know  He will send us  communications within 3 to 4 weeks of starting GLP-1 He will follow-up in approximately 4 months

## 2024-02-10 ENCOUNTER — Encounter: Payer: Self-pay | Admitting: Family Medicine

## 2024-02-11 ENCOUNTER — Other Ambulatory Visit: Payer: Self-pay | Admitting: Family Medicine

## 2024-02-11 ENCOUNTER — Telehealth: Payer: Self-pay | Admitting: Pharmacy Technician

## 2024-02-11 ENCOUNTER — Other Ambulatory Visit (HOSPITAL_COMMUNITY): Payer: Self-pay

## 2024-02-11 MED ORDER — LISINOPRIL-HYDROCHLOROTHIAZIDE 10-12.5 MG PO TABS: 1.0000 | ORAL_TABLET | Freq: Every day | ORAL | 1 refills | Status: AC

## 2024-02-11 MED ORDER — INSULIN GLARGINE 100 UNIT/ML SOLOSTAR PEN: 20.0000 [IU] | PEN_INJECTOR | Freq: Every day | SUBCUTANEOUS | 5 refills | Status: AC

## 2024-02-11 MED ORDER — INSULIN ASPART 100 UNIT/ML FLEXPEN: 0.0000 [IU] | PEN_INJECTOR | Freq: Three times a day (TID) | SUBCUTANEOUS | 5 refills | Status: AC

## 2024-02-11 MED ORDER — SERTRALINE HCL 50 MG PO TABS: 50.0000 mg | ORAL_TABLET | Freq: Every day | ORAL | 1 refills | Status: AC

## 2024-02-11 NOTE — Telephone Encounter (Signed)
 Pharmacy Patient Advocate Encounter   Received notification from Onbase that prior authorization for INSULIN  ASPART FLEXPEN 100UNIT/ML PEN is required/requested.   Insurance verification completed.   The patient is insured through [facility] .   Per test claim:  BRAND NAME NOVOLOG  FLEXPEN is preferred by the insurance.  If suggested medication is appropriate, Please send in a new RX and discontinue this one. If not, please advise as to why it's not appropriate so that we may request a Prior Authorization. Please note, some preferred medications may still require a PA.  If the suggested medications have not been trialed and there are no contraindications to their use, the PA will not be submitted, as it will not be approved.  New Rx is not required. Med can be changed at the pharmacy as brand name preferred. Called [facility] and advised them to fill for brand name and confirmed that the medication did go through for $0.00 copay.

## 2024-02-18 ENCOUNTER — Other Ambulatory Visit: Payer: Self-pay

## 2024-02-18 DIAGNOSIS — E119 Type 2 diabetes mellitus without complications: Secondary | ICD-10-CM

## 2024-02-28 ENCOUNTER — Other Ambulatory Visit (HOSPITAL_COMMUNITY): Payer: Self-pay

## 2024-03-01 ENCOUNTER — Other Ambulatory Visit: Payer: Self-pay

## 2024-03-01 ENCOUNTER — Telehealth: Payer: Self-pay | Admitting: Family Medicine

## 2024-03-01 NOTE — Telephone Encounter (Signed)
 Nurses Patient has follow-up visit in February Please order A1c, lipid, liver, metabolic 7, PSA, urine ACR Screening prostate cancer, type 2 diabetes, hypertension, hyperlipidemia, high risk med Patient is aware to do these before his follow-up visit

## 2024-03-01 NOTE — Telephone Encounter (Signed)
 Nurses Please order freestyle libre 3 sensors with the reader, may have 1 years worth of refills on the sensors  Send patient message letting him know that this has been ordered If his insurance does not cover that 1 we may have to order Dexcom Patient is a type II diabetic on insulin   See message below from [facility].D.  Billee Mliss BIRCH, RPH  Alphonsa Glendia LABOR, MD Both libre 3 plus and dexcom G7 come with readers. Libre 3 plus sensors #2 for 30 days, libre 3 reader #1 Dexcom g7 sensors #3 for 30 days, dexcom g7 reader/device #1  Feel free to put in a [id number] if you want us  to assist.  He seems really private, so I understand if he doesn't want pharmacy involved.  Thanks, Julie

## 2024-03-02 ENCOUNTER — Other Ambulatory Visit: Payer: Self-pay

## 2024-03-02 DIAGNOSIS — Z79899 Other long term (current) drug therapy: Secondary | ICD-10-CM

## 2024-03-02 DIAGNOSIS — Z794 Long term (current) use of insulin: Secondary | ICD-10-CM

## 2024-03-02 DIAGNOSIS — E7849 Other hyperlipidemia: Secondary | ICD-10-CM

## 2024-03-02 DIAGNOSIS — Z125 Encounter for screening for malignant neoplasm of prostate: Secondary | ICD-10-CM

## 2024-03-02 DIAGNOSIS — I10 Essential (primary) hypertension: Secondary | ICD-10-CM

## 2024-03-03 ENCOUNTER — Other Ambulatory Visit: Payer: Self-pay

## 2024-03-03 MED ORDER — FREESTYLE LIBRE 3 READER DEVI: 2 refills | Status: AC

## 2024-03-03 MED ORDER — FREESTYLE LIBRE 3 PLUS SENSOR MISC: 12 refills | Status: AC

## 2024-04-29 ENCOUNTER — Other Ambulatory Visit: Payer: Self-pay | Admitting: Family Medicine

## 2024-04-29 DIAGNOSIS — E119 Type 2 diabetes mellitus without complications: Secondary | ICD-10-CM

## 2024-05-15 NOTE — Telephone Encounter (Signed)
 Patient called with no answer Detailed message left regarding scheduling renial us  before upcoming appointment.

## 2024-05-27 ENCOUNTER — Other Ambulatory Visit: Payer: Self-pay | Admitting: Family Medicine

## 2024-05-28 ENCOUNTER — Other Ambulatory Visit: Payer: Self-pay | Admitting: Family Medicine

## 2024-05-28 DIAGNOSIS — E119 Type 2 diabetes mellitus without complications: Secondary | ICD-10-CM

## 2024-06-08 ENCOUNTER — Other Ambulatory Visit

## 2024-06-08 DIAGNOSIS — N200 Calculus of kidney: Secondary | ICD-10-CM

## 2024-06-08 DIAGNOSIS — E291 Testicular hypofunction: Secondary | ICD-10-CM

## 2024-06-10 LAB — CBC
Hematocrit: 59.6 % — ABNORMAL HIGH (ref 37.5–51.0)
Hemoglobin: 20.5 g/dL (ref 13.0–17.7)
MCH: 30.5 pg (ref 26.6–33.0)
MCHC: 34.4 g/dL (ref 31.5–35.7)
MCV: 89 fL (ref 79–97)
Platelets: 284 x10E3/uL (ref 150–450)
RBC: 6.72 x10E6/uL — ABNORMAL HIGH (ref 4.14–5.80)
RDW: 11.8 % (ref 11.6–15.4)
WBC: 9.5 x10E3/uL (ref 3.4–10.8)

## 2024-06-10 LAB — COMPREHENSIVE METABOLIC PANEL WITH GFR
ALT: 37 IU/L (ref 0–44)
AST: 31 IU/L (ref 0–40)
Albumin: 4.4 g/dL (ref 3.8–4.9)
Alkaline Phosphatase: 77 IU/L (ref 47–123)
BUN/Creatinine Ratio: 14 (ref 9–20)
BUN: 15 mg/dL (ref 6–24)
Bilirubin Total: 0.7 mg/dL (ref 0.0–1.2)
CO2: 25 mmol/L (ref 20–29)
Calcium: 9.4 mg/dL (ref 8.7–10.2)
Chloride: 98 mmol/L (ref 96–106)
Creatinine, Ser: 1.04 mg/dL (ref 0.76–1.27)
Globulin, Total: 2.5 g/dL (ref 1.5–4.5)
Glucose: 138 mg/dL — ABNORMAL HIGH (ref 70–99)
Potassium: 4.1 mmol/L (ref 3.5–5.2)
Sodium: 141 mmol/L (ref 134–144)
Total Protein: 6.9 g/dL (ref 6.0–8.5)
eGFR: 84 mL/min/1.73 (ref >59)

## 2024-06-10 LAB — TESTOSTERONE,FREE AND TOTAL
Testosterone, Free: 4.4 pg/mL — ABNORMAL LOW (ref 7.2–24.0)
Testosterone: 87 ng/dL — ABNORMAL LOW (ref 264–916)

## 2024-06-15 ENCOUNTER — Ambulatory Visit (HOSPITAL_COMMUNITY)
Admission: RE | Admit: 2024-06-15 | Discharge: 2024-06-15 | Disposition: A | Discharge status: Home / Self Care | Source: Ambulatory Visit | Attending: Urology | Admitting: Urology

## 2024-06-15 DIAGNOSIS — N200 Calculus of kidney: Secondary | ICD-10-CM | POA: Insufficient documentation

## 2024-06-16 ENCOUNTER — Ambulatory Visit: Admitting: Urology

## 2024-06-16 VITALS — BP 118/79 | HR 83

## 2024-06-16 DIAGNOSIS — Z87442 Personal history of urinary calculi: Secondary | ICD-10-CM

## 2024-06-16 DIAGNOSIS — E291 Testicular hypofunction: Secondary | ICD-10-CM

## 2024-06-16 DIAGNOSIS — N200 Calculus of kidney: Secondary | ICD-10-CM

## 2024-06-16 MED ORDER — JATENZO 237 MG PO CAPS: 1.0000 | ORAL_CAPSULE | Freq: Two times a day (BID) | ORAL | 5 refills | Status: AC

## 2024-06-16 NOTE — Progress Notes (Unsigned)
 06/16/2024 11:13 AM   Bryan Gilbert Feb 27, 1966 [id number]  Referring provider: Alphonsa Glendia LABOR, MD [address] [place],  [place] [address]  No chief complaint on file.   HPI: He has been off his Jatenzo  for 10 days. Testosterone  87 off medication. Hemoglobin 9.5. Renal US  shows no calculi   PMH: Past Medical History:  Diagnosis Date   Abdominal migraine    Cervical neuralgia    Cyclical vomiting    Elevated hemoglobin    Hypertension    Renal disorder    kidney stone   Sleep apnea    states uses O2 and CPAP at night   Slipped intervertebral disc     Surgical History: Past Surgical History:  Procedure Laterality Date   CYSTOSCOPY W/ URETERAL STENT PLACEMENT Right 01/05/2024   Procedure: CYSTOSCOPY, WITH RETROGRADE PYELOGRAM AND URETERAL STENT INSERTION;  Surgeon: Carolee Sherwood JONETTA DOUGLAS, MD;  Location: [facility];  Service: Urology;  Laterality: Right;   CYSTOSCOPY/RETROGRADE/URETEROSCOPY/STONE EXTRACTION WITH BASKET Right 01/13/2024   Procedure: CYSTOSCOPY, WITH CALCULUS REMOVAL USING BASKET;  Surgeon: Sherrilee Belvie CROME, MD;  Location: [facility];  Service: Urology;  Laterality: Right;   CYSTOSCOPY/URETEROSCOPY/HOLMIUM LASER/STENT PLACEMENT Right 01/13/2024   Procedure: CYSTOSCOPY/URETEROSCOPY/HOLMIUM LASER/STENT PLACEMENT;  Surgeon: Sherrilee Belvie CROME, MD;  Location: [facility];  Service: Urology;  Laterality: Right;   ESOPHAGOGASTRODUODENOSCOPY     HOLMIUM LASER APPLICATION Right 01/13/2024   Procedure: HOLMIUM LASER APPLICATION;  Surgeon: Sherrilee Belvie CROME, MD;  Location: [facility];  Service: Urology;  Laterality: Right;    Home Medications:  Allergies as of 06/16/2024       Reactions   Cefprozil  Diarrhea, Nausea Only   Intestinal upset and diarrhea   Lipitor [atorvastatin] Other (See Comments)   Myalgia   Xtandi [enzalutamide] Other (See Comments)   GI upset        Medication List        Accurate as of June 16, 2024 11:13 AM. If you have any questions,  ask your nurse or doctor.          Accu-Chek Guide Test test strip Generic drug: glucose blood Use to check blood sugar three times daily   Accu-Chek Guide Test test strip Generic drug: glucose blood 1 EACH BY OTHER ROUTE IN THE MORNING, AT NOON, AND AT BEDTIME. USE AS INSTRUCTED   Accu-Chek Guide w/Device Kit Use to check blood sugar as directed   Accu-Chek Softclix Lancets lancets Use to check blood sugar three times daily as directed   Accu-Chek Softclix Lancets lancets 1 each by Other route in the morning, at noon, and at bedtime. Use as instructed   butorphanol  10 MG/ML nasal spray Commonly known as: STADOL  Place 1 spray into the nose every 4 (four) hours as needed for headache or migraine.   cyclobenzaprine  10 MG tablet Commonly known as: FLEXERIL  Take 10 mg by mouth 3 (three) times daily as needed for muscle spasms.   Embecta Pen Needle Ultrafine 31G X 5 MM Misc Generic drug: Insulin  Pen Needle USE TO INJECT INSULIN  AS DIRECTED   FreeStyle Libre 3 Plus Sensor Misc Change sensor every 15 days.   FreeStyle Libre 3 Reader Espiridion As directed   HYDROcodone -acetaminophen  10-325 MG tablet Commonly known as: NORCO Take 1 tablet by mouth 4 (four) times daily as needed for moderate pain (pain score 4-6).   insulin  aspart 100 UNIT/ML FlexPen Commonly known as: NOVOLOG  Inject 0-18 Units into the skin 3 (three) times daily with meals. Check Blood Glucose (  BG) and inject per scale: BG <150= 0 unit; BG 150-200= 3 unit; BG 201-250= 6 unit; BG 251-300= 9 unit; BG 301-350= 12 unit; BG 351-400= 15 unit; BG >400= 18 unit and Call Primary Care.   insulin  glargine 100 UNIT/ML Solostar Pen Commonly known as: LANTUS  Inject 20 Units into the skin at bedtime.   Jatenzo  237 MG Caps Generic drug: Testosterone  Undecanoate Take 1 capsule (237 mg total) by mouth 2 (two) times daily.   levorphanol 2 MG tablet Commonly known as: LEVODROMORAN Take 3 mg by mouth every 6 (six) hours as  needed for pain.   lisinopril -hydrochlorothiazide  10-12.5 MG tablet Commonly known as: ZESTORETIC  Take 1 tablet by mouth daily.   loperamide  2 MG capsule Commonly known as: IMODIUM  Take 2 mg by mouth as needed for diarrhea or loose stools.   meloxicam  15 MG tablet Commonly known as: MOBIC  TAKE 1 TABLET (15 MG TOTAL) BY MOUTH DAILY.   ondansetron  8 MG disintegrating tablet Commonly known as: ZOFRAN -ODT Take 1 tablet (8 mg total) by mouth every 8 (eight) hours as needed for nausea or vomiting.   OneTouch Delica Plus Lancing Misc 1 each by Does not apply route 3 (three) times daily. May dispense any manufacturer covered by patient's insurance.   oxyCODONE -acetaminophen  5-325 MG tablet Commonly known as: Percocet Take 1 tablet by mouth every 4 (four) hours as needed for severe pain (pain score 7-10).   pseudoephedrine 120 MG 12 hr tablet Commonly known as: SUDAFED Take 120 mg by mouth daily as needed for congestion.   QUEtiapine  25 MG tablet Commonly known as: SEROquel  Take 1 tablet (25 mg total) by mouth at bedtime.   SALINE NASAL SPRAY NA Place 1 spray into the nose daily as needed (dryness). Reported on 09/17/2015   sertraline  50 MG tablet Commonly known as: ZOLOFT  Take 1 tablet (50 mg total) by mouth daily.   simethicone  80 MG chewable tablet Commonly known as: MYLICON Chew 80 mg by mouth every 6 (six) hours as needed for flatulence.   tamsulosin  0.4 MG Caps capsule Commonly known as: FLOMAX  TAKE 1 CAPSULE BY MOUTH EVERY DAY AFTER SUPPER   tirzepatide  2.5 MG/0.5ML Pen Commonly known as: MOUNJARO  Inject 2.5 mg into the skin once a week.   vitamin B-12 100 MCG tablet Commonly known as: CYANOCOBALAMIN Take 100 mcg by mouth daily.        Allergies: Allergies[1]  Family History: Family History  Problem Relation Age of Onset   Heart failure Father    Hypertension Father     Social History:  reports that he has never smoked. He has never used smokeless  tobacco. He reports current alcohol use. He reports that he does not use drugs.  ROS: All other review of systems were reviewed and are negative except what is noted above in HPI  Physical Exam: BP 118/79   Pulse 83   Constitutional:  Alert and oriented, No acute distress. HEENT: [place] AT, moist mucus membranes.  Trachea midline, no masses. Cardiovascular: No clubbing, cyanosis, or edema. Respiratory: Normal respiratory effort, no increased work of breathing. GI: Abdomen is soft, nontender, nondistended, no abdominal masses GU: No CVA tenderness.  Lymph: No cervical or inguinal lymphadenopathy. Skin: No rashes, bruises or suspicious lesions. Neurologic: Grossly intact, no focal deficits, moving all 4 extremities. Psychiatric: Normal mood and affect.  Laboratory Data: Lab Results  Component Value Date   WBC 9.5 06/08/2024   HGB 20.5 (HH) 06/08/2024   HCT 59.6 (H) 06/08/2024   MCV  89 06/08/2024   PLT 284 06/08/2024    Lab Results  Component Value Date   CREATININE 1.04 06/08/2024    No results found for: PSA  Lab Results  Component Value Date   TESTOSTERONE  87 (L) 06/08/2024    Lab Results  Component Value Date   HGBA1C 9.4 (H) 01/05/2024    Urinalysis    Component Value Date/Time   COLORURINE YELLOW 01/04/2024 1836   APPEARANCEUR Clear 01/26/2024 0855   LABSPEC 1.022 01/04/2024 1836   PHURINE 5.0 01/04/2024 1836   GLUCOSEU Negative 01/26/2024 0855   HGBUR LARGE (A) 01/04/2024 1836   BILIRUBINUR Negative 01/26/2024 0855   KETONESUR 20 (A) 01/04/2024 1836   PROTEINUR 3+ (A) 01/26/2024 0855   PROTEINUR NEGATIVE 01/04/2024 1836   UROBILINOGEN 0.2 05/19/2013 1732   NITRITE Negative 01/26/2024 0855   NITRITE NEGATIVE 01/04/2024 1836   LEUKOCYTESUR 2+ (A) 01/26/2024 0855   LEUKOCYTESUR NEGATIVE 01/04/2024 1836    Lab Results  Component Value Date   LABMICR See below: 01/26/2024   WBCUA 6-10 (A) 01/26/2024   LABEPIT 0-10 01/26/2024   BACTERIA None seen  01/26/2024    Pertinent Imaging: *** No results found for this or any previous visit.  No results found for this or any previous visit.  No results found for this or any previous visit.  No results found for this or any previous visit.  No results found for this or any previous visit.  No results found for this or any previous visit.  No results found for this or any previous visit.  Results for orders placed during the hospital encounter of 01/04/24  CT Renal Stone Study  Narrative CLINICAL DATA:  Abdominal/flank pain, stone suspected right flank pain  EXAM: CT ABDOMEN AND PELVIS WITHOUT CONTRAST  TECHNIQUE: Multidetector CT imaging of the abdomen and pelvis was performed following the standard protocol without IV contrast.  RADIATION DOSE REDUCTION: This exam was performed according to the departmental dose-optimization program which includes automated exposure control, adjustment of the mA and/or kV according to patient size and/or use of iterative reconstruction technique.  COMPARISON:  CT 04/18/2023  FINDINGS: Lower chest: Clear lung bases.  Hepatobiliary: The liver is enlarged spanning 19.3 cm cranial caudal with diffuse steatosis. No evidence of focal liver abnormality on this unenhanced exam. The gallbladder is partially distended. No calcified gallstone or pericholecystic inflammation. Stable biliary tree.  Pancreas: Fatty atrophy.  No ductal dilatation or inflammation.  Spleen: No splenomegaly.  No focal abnormality.  Adrenals/Urinary Tract: No adrenal nodule. Obstructing 4 x 6 mm stone in the mid right ureter (at the L4 level) with mild proximal hydroureteronephrosis and perinephric stranding. Simple cyst in the upper right kidney. No further follow-up imaging is recommended. Two 5 mm nonobstructing stones in the left kidney. No left hydronephrosis. Unremarkable urinary bladder.  Stomach/Bowel: No bowel obstruction or inflammation.  Moderate colonic stool burden. Normal appendix. Tiny hiatal hernia.  Vascular/Lymphatic: Normal caliber abdominal aorta. Shotty periportal and portal caval nodes are likely reactive. No enlarged lymph nodes in the abdomen or pelvis.  Reproductive: Prostate is unremarkable.  Other: No free air, free fluid, or intra-abdominal fluid collection.  Musculoskeletal: There are no acute or suspicious osseous abnormalities. L5-S1 degenerative disc disease  IMPRESSION: 1. Obstructing 4 x 6 mm stone in the mid right ureter with mild proximal hydroureteronephrosis and perinephric stranding. 2. Nonobstructing left nephrolithiasis. 3. Hepatomegaly with hepatic steatosis.   Electronically Signed By: Andrea Gasman M.D. On: 01/04/2024 16:48   Assessment &  Plan:    1. Nephrolithiasis (Primary) -followup 1 year renal US   2. Hypogonadism -continue jatenzo  37mg  BID -followup 6 months with labs   No follow-ups on file.  [name], MD  [facility] [address]      [1]  Allergies Allergen Reactions   Cefprozil  Diarrhea and Nausea Only    Intestinal upset and diarrhea   Lipitor [Atorvastatin] Other (See Comments)    Myalgia   Xtandi [Enzalutamide] Other (See Comments)    GI upset

## 2024-06-16 NOTE — Patient Instructions (Signed)
 Hypogonadism, Male  Male hypogonadism is a condition of having a level of testosterone  that is lower than normal. Testosterone  is a chemical, or hormone, that is made mainly in the testicles. In boys, testosterone  is responsible for the development of male characteristics during puberty. These include: Making the penis bigger. Growing and building the muscles. Growing facial hair. Deepening the voice. In adult men, testosterone  is responsible for maintaining: An interest in sex and the ability to have sex. Muscle mass. Sperm production. Red blood cell production. Bone strength. Testosterone  also gives men energy and a sense of well-being. Testosterone  normally decreases as men age and the testicles make less testosterone . Testosterone  levels can vary from man to man. Not all men will have signs and symptoms of low testosterone . Weight, alcohol use, medicines, and certain medical conditions can affect a man's testosterone  level. What are the causes? This condition is caused by: A natural decrease in testosterone  that occurs as a man grows older. This is the main cause of this condition. Use of medicines, such as antidepressants, steroids, and opioids. Diseases and conditions that affect the testicles or the making of testosterone . These include: Injury or damage to the testicles from trauma, cancer, cancer treatment, or infection. Diabetes. Sleep apnea. Genetic conditions that men are born with. Disease of the pituitary gland. This gland is in the brain. It produces hormones. Obesity. Metabolic syndrome. This is a group of diseases that affect blood pressure, blood sugar, cholesterol, and belly fat. HIV or AIDS. Alcohol abuse. Kidney failure. Other long-term or chronic diseases. What are the signs or symptoms? Common symptoms of this condition include: Loss of interest in sex (low sex drive). Inability to have or maintain an erection (erectile dysfunction). Feeling tired  (fatigue). Mood changes, like irritability or depression. Loss of muscle and body hair. Infertility. Large breasts. Weight gain (obesity). How is this diagnosed? Your health care provider can diagnose hypogonadism based on: Your signs and symptoms. A physical exam to check your testosterone  levels. This includes blood tests. Testosterone  levels can change throughout the day. Levels are highest in the morning. You may need to have repeat blood tests before getting a diagnosis of hypogonadism. Depending on your medical history and test results, your health care provider may also do other tests to find the cause of low testosterone . How is this treated? This condition is treated with testosterone  replacement therapy. Testosterone  can be given by: Injection or through pellets inserted under the skin. Gels or patches placed on the skin or in the mouth. Testosterone  therapy is not for everyone. It has risks and side effects. Your health care provider will consider your medical history, your risk for prostate cancer, your age, and your symptoms before putting you on testosterone  replacement therapy. Follow these instructions at home: Take over-the-counter and prescription medicines only as told by your health care provider. Eat foods that are high in fiber, such as beans, whole grains, and fresh fruits and vegetables. Limit foods that are high in fat and processed sugars, such as fried or sweet foods. If you drink alcohol: Limit how much you have to 0-2 drinks a day. Know how much alcohol is in your drink. In the [place] one drink equals one 12 oz bottle of beer (355 mL), one 5 oz glass of wine (148 mL), or one 1 oz glass of hard liquor (44 mL). Return to your normal activities as told by your health care provider. Ask your health care provider what activities are safe for you. Keep all  follow-up visits. This is important. Contact a health care provider if: You have any of the signs or symptoms of  low testosterone . You have any side effects from testosterone  therapy. Summary Male hypogonadism is a condition of having a level of testosterone  that is lower than normal. The natural drop in testosterone  production that occurs with age is the most common cause of this condition. Low testosterone  can also be caused by many diseases and conditions that affect the testicles and the making of testosterone . This condition is treated with testosterone  replacement therapy. There are risks and side effects of testosterone  therapy. Your health care provider will consider your age, medical history, symptoms, and risks for prostate cancer before putting you on testosterone  therapy. This information is not intended to replace advice given to you by your health care provider. Make sure you discuss any questions you have with your health care provider. Document Revised: 05/31/2023 Document Reviewed: 05/31/2023 Elsevier Patient Education  2025 [employer].

## 2024-06-27 ENCOUNTER — Encounter: Payer: Self-pay | Admitting: Urology

## 2024-07-03 ENCOUNTER — Other Ambulatory Visit: Payer: Self-pay

## 2024-07-03 ENCOUNTER — Emergency Department (HOSPITAL_COMMUNITY)
Admission: EM | Admit: 2024-07-03 | Discharge: 2024-07-03 | Disposition: A | Discharge status: Home / Self Care | Attending: Emergency Medicine | Admitting: Emergency Medicine

## 2024-07-03 ENCOUNTER — Emergency Department (HOSPITAL_COMMUNITY)

## 2024-07-03 ENCOUNTER — Encounter (HOSPITAL_COMMUNITY): Payer: Self-pay

## 2024-07-03 DIAGNOSIS — N132 Hydronephrosis with renal and ureteral calculous obstruction: Secondary | ICD-10-CM | POA: Insufficient documentation

## 2024-07-03 DIAGNOSIS — Z794 Long term (current) use of insulin: Secondary | ICD-10-CM | POA: Diagnosis not present

## 2024-07-03 DIAGNOSIS — Z79899 Other long term (current) drug therapy: Secondary | ICD-10-CM | POA: Insufficient documentation

## 2024-07-03 DIAGNOSIS — N200 Calculus of kidney: Secondary | ICD-10-CM

## 2024-07-03 DIAGNOSIS — E119 Type 2 diabetes mellitus without complications: Secondary | ICD-10-CM | POA: Diagnosis not present

## 2024-07-03 DIAGNOSIS — I10 Essential (primary) hypertension: Secondary | ICD-10-CM | POA: Insufficient documentation

## 2024-07-03 DIAGNOSIS — R10A2 Flank pain, left side: Secondary | ICD-10-CM | POA: Diagnosis present

## 2024-07-03 HISTORY — DX: Type 2 diabetes mellitus without complications: E11.9

## 2024-07-03 LAB — BASIC METABOLIC PANEL WITH GFR
Anion gap: 18 — ABNORMAL HIGH (ref 5–15)
BUN: 19 mg/dL (ref 6–20)
CO2: 21 mmol/L — ABNORMAL LOW (ref 22–32)
Calcium: 10.3 mg/dL (ref 8.9–10.3)
Chloride: 99 mmol/L (ref 98–111)
Creatinine, Ser: 1.02 mg/dL (ref 0.61–1.24)
GFR, Estimated: >60 mL/min
Glucose, Bld: 196 mg/dL — ABNORMAL HIGH (ref 70–99)
Potassium: 3.8 mmol/L (ref 3.5–5.1)
Sodium: 139 mmol/L (ref 135–145)

## 2024-07-03 LAB — CBC
HCT: 55.4 % — ABNORMAL HIGH (ref 39.0–52.0)
Hemoglobin: 19.1 g/dL — ABNORMAL HIGH (ref 13.0–17.0)
MCH: 29.4 pg (ref 26.0–34.0)
MCHC: 34.5 g/dL (ref 30.0–36.0)
MCV: 85.4 fL (ref 80.0–100.0)
Platelets: 314 K/uL (ref 150–400)
RBC: 6.49 MIL/uL — ABNORMAL HIGH (ref 4.22–5.81)
RDW: 12.6 % (ref 11.5–15.5)
WBC: 13.9 K/uL — ABNORMAL HIGH (ref 4.0–10.5)
nRBC: 0 % (ref 0.0–0.2)

## 2024-07-03 LAB — URINALYSIS, ROUTINE W REFLEX MICROSCOPIC
Bacteria, UA: NONE SEEN
Bilirubin Urine: NEGATIVE
Glucose, UA: 150 mg/dL — AB
Ketones, ur: 20 mg/dL — AB
Leukocytes,Ua: NEGATIVE
Nitrite: NEGATIVE
Protein, ur: 30 mg/dL — AB
Specific Gravity, Urine: 1.027 (ref 1.005–1.030)
pH: 5 (ref 5.0–8.0)

## 2024-07-03 LAB — MAGNESIUM: Magnesium: 2.2 mg/dL (ref 1.7–2.4)

## 2024-07-03 LAB — CBG MONITORING, ED: Glucose-Capillary: 244 mg/dL — ABNORMAL HIGH (ref 70–99)

## 2024-07-03 MED ORDER — LACTATED RINGERS IV BOLUS
1000.0000 mL | Freq: Once | INTRAVENOUS | Status: AC
  Administered 2024-07-03: 1000 mL via INTRAVENOUS

## 2024-07-03 MED ORDER — ONDANSETRON HCL 4 MG/2ML IJ SOLN
4.0000 mg | Freq: Once | INTRAMUSCULAR | Status: AC
  Administered 2024-07-03: 4 mg via INTRAVENOUS
  Filled 2024-07-03: qty 2

## 2024-07-03 MED ORDER — KETOROLAC TROMETHAMINE 15 MG/ML IJ SOLN
15.0000 mg | Freq: Once | INTRAMUSCULAR | Status: AC
  Administered 2024-07-03: 15 mg via INTRAVENOUS
  Filled 2024-07-03: qty 1

## 2024-07-03 MED ORDER — HYDROMORPHONE HCL 1 MG/ML IJ SOLN
1.0000 mg | Freq: Once | INTRAMUSCULAR | Status: AC
  Administered 2024-07-03: 1 mg via INTRAVENOUS
  Filled 2024-07-03: qty 1

## 2024-07-03 MED ORDER — ONDANSETRON 4 MG PO TBDP: 4.0000 mg | ORAL_TABLET | Freq: Three times a day (TID) | ORAL | 0 refills | Status: AC | PRN

## 2024-07-03 MED ORDER — FENTANYL CITRATE (PF) 100 MCG/2ML IJ SOLN
50.0000 ug | Freq: Once | INTRAMUSCULAR | Status: AC
  Administered 2024-07-03: 50 ug via INTRAVENOUS
  Filled 2024-07-03: qty 2

## 2024-07-03 MED ORDER — TAMSULOSIN HCL 0.4 MG PO CAPS
0.4000 mg | ORAL_CAPSULE | Freq: Once | ORAL | Status: AC
  Administered 2024-07-03: 0.4 mg via ORAL
  Filled 2024-07-03: qty 1

## 2024-07-03 NOTE — ED Provider Notes (Signed)
 " [place] EMERGENCY DEPARTMENT AT [facility] Provider Note   CSN: [id number] Arrival date & time: 07/03/24  9141     Patient presents with: Flank Pain   Bryan Gilbert is a 58 y.o. male.    Flank Pain  Patient presents for flank pain.  His medical history includes HTN, OSA, DM, cyclic vomiting.  He had a kidney stone earlier this year.  He did undergo stent placement.  He most recently followed up with urology 2 weeks ago.  Onset of current symptoms was yesterday at around noon.  He had onset of a left posterolateral flank pain.  He had associated nausea and vomiting.  He has had p.o. intolerance since that time.  He has not taken any pain medication.  He has been trying heating pads without relief.  Current pain is 8/10 in severity.  He does have ongoing nausea.     Prior to Admission medications  Medication Sig Start Date End Date Taking? Authorizing Provider  Continuous Glucose Receiver (FREESTYLE LIBRE 3 READER) DEVI As directed 03/03/24   Alphonsa Glendia LABOR, MD  Continuous Glucose Sensor (FREESTYLE LIBRE 3 PLUS SENSOR) MISC Change sensor every 15 days. 03/03/24   Alphonsa Glendia LABOR, MD  ondansetron  (ZOFRAN -ODT) 4 MG disintegrating tablet Take 1 tablet (4 mg total) by mouth every 8 (eight) hours as needed. 07/03/24  Yes Melvenia Motto, MD  ACCU-CHEK GUIDE TEST test strip 1 EACH BY OTHER ROUTE IN THE MORNING, AT NOON, AND AT BEDTIME. USE AS INSTRUCTED 05/29/24   Alphonsa Glendia LABOR, MD  Accu-Chek Softclix Lancets lancets 1 each by Other route in the morning, at noon, and at bedtime. Use as instructed 01/27/24   Alphonsa Glendia LABOR, MD  Blood Glucose Monitoring Suppl (BLOOD GLUCOSE MONITOR SYSTEM) w/Device KIT Use to check blood sugar as directed 01/06/24   Vernon Ranks, MD  butorphanol  (STADOL ) 10 MG/ML nasal spray Place 1 spray into the nose every 4 (four) hours as needed for headache or migraine.  07/28/17   [provider]  cyclobenzaprine  (FLEXERIL ) 10 MG tablet Take 10 mg by  mouth 3 (three) times daily as needed for muscle spasms.     [provider]  EMBECTA PEN NEEDLE ULTRAFINE 31G X 5 MM MISC USE TO INJECT INSULIN  AS DIRECTED 05/01/24   Alphonsa Glendia LABOR, MD  Glucose Blood (BLOOD GLUCOSE TEST STRIPS) STRP Use to check blood sugar three times daily 01/06/24   Vernon Ranks, MD  HYDROcodone -acetaminophen  (NORCO) 10-325 MG tablet Take 1 tablet by mouth 4 (four) times daily as needed for moderate pain (pain score 4-6). 12/11/23   [provider]  insulin  aspart (NOVOLOG ) 100 UNIT/ML FlexPen Inject 0-18 Units into the skin 3 (three) times daily with meals. Check Blood Glucose (BG) and inject per scale: BG <150= 0 unit; BG 150-200= 3 unit; BG 201-250= 6 unit; BG 251-300= 9 unit; BG 301-350= 12 unit; BG 351-400= 15 unit; BG >400= 18 unit and Call Primary Care. 02/11/24   Alphonsa Glendia LABOR, MD  insulin  glargine (LANTUS ) 100 UNIT/ML Solostar Pen Inject 20 Units into the skin at bedtime. 02/11/24   Alphonsa Glendia LABOR, MD  Lancet Device MISC 1 each by Does not apply route 3 (three) times daily. May dispense any manufacturer covered by patient's insurance. 01/06/24   Vernon Ranks, MD  Lancets MISC Use to check blood sugar three times daily as directed 01/06/24   Vernon Ranks, MD  levorphanol (LEVODROMORAN) 2 MG tablet Take 3 mg by mouth  every 6 (six) hours as needed for pain. 06/30/20   [provider]  lisinopril -hydrochlorothiazide  (ZESTORETIC ) 10-12.5 MG tablet Take 1 tablet by mouth daily. 02/11/24   Alphonsa Glendia LABOR, MD  loperamide  (IMODIUM ) 2 MG capsule Take 2 mg by mouth as needed for diarrhea or loose stools.    [provider]  meloxicam  (MOBIC ) 15 MG tablet TAKE 1 TABLET (15 MG TOTAL) BY MOUTH DAILY. 05/29/24   Alphonsa Glendia LABOR, MD  ondansetron  (ZOFRAN -ODT) 8 MG disintegrating tablet Take 1 tablet (8 mg total) by mouth every 8 (eight) hours as needed for nausea or vomiting. 01/13/24   McKenzie, Belvie CROME, MD  oxyCODONE -acetaminophen  (PERCOCET) 5-325 MG tablet  Take 1 tablet by mouth every 4 (four) hours as needed for severe pain (pain score 7-10). Patient not taking: Reported on 01/26/2024 01/13/24 01/12/25  Sherrilee Belvie CROME, MD  pseudoephedrine (SUDAFED) 120 MG 12 hr tablet Take 120 mg by mouth daily as needed for congestion.    [provider]  QUEtiapine  (SEROQUEL ) 25 MG tablet Take 1 tablet (25 mg total) by mouth at bedtime. 02/09/24   Alphonsa Glendia LABOR, MD  SALINE NASAL SPRAY NA Place 1 spray into the nose daily as needed (dryness). Reported on 09/17/2015    [provider]  sertraline  (ZOLOFT ) 50 MG tablet Take 1 tablet (50 mg total) by mouth daily. 02/11/24   Alphonsa Glendia LABOR, MD  simethicone  (MYLICON) 80 MG chewable tablet Chew 80 mg by mouth every 6 (six) hours as needed for flatulence.    [provider]  tamsulosin  (FLOMAX ) 0.4 MG CAPS capsule TAKE 1 CAPSULE BY MOUTH EVERY DAY AFTER SUPPER 02/16/24   McKenzie, Belvie CROME, MD  Testosterone  Undecanoate (JATENZO ) 237 MG CAPS Take 1 capsule (237 mg total) by mouth 2 (two) times daily. 06/16/24   McKenzie, Belvie CROME, MD  tirzepatide  (MOUNJARO ) 2.5 MG/0.5ML Pen Inject 2.5 mg into the skin once a week. 02/09/24   Alphonsa Glendia LABOR, MD  vitamin B-12 (CYANOCOBALAMIN) 100 MCG tablet Take 100 mcg by mouth daily.    [provider]    Allergies: Cefprozil , Lipitor [atorvastatin], and Xtandi [enzalutamide]    Review of Systems  Gastrointestinal:  Positive for nausea and vomiting.  Genitourinary:  Positive for flank pain.  All other systems reviewed and are negative.   Updated Vital Signs BP 129/76   Pulse 95   Temp 97.9 F (36.6 C) (Oral)   Resp 19   Ht 5' 7 (1.702 m)   Wt 106.6 kg   SpO2 91%   BMI 36.81 kg/m   Physical Exam Vitals and nursing note reviewed.  Constitutional:      General: He is not in acute distress.    Appearance: Normal appearance. He is well-developed. He is not ill-appearing, toxic-appearing or diaphoretic.  HENT:     Head: Normocephalic and  atraumatic.     Right Ear: External ear normal.     Left Ear: External ear normal.     Nose: Nose normal.     Mouth/Throat:     Mouth: Mucous membranes are moist.  Eyes:     Extraocular Movements: Extraocular movements intact.     Conjunctiva/sclera: Conjunctivae normal.  Cardiovascular:     Rate and Rhythm: Normal rate and regular rhythm.  Pulmonary:     Effort: Pulmonary effort is normal. No respiratory distress.  Abdominal:     General: There is no distension.     Palpations: Abdomen is soft.     Tenderness: There  is no abdominal tenderness.  Musculoskeletal:        General: No swelling.     Cervical back: Normal range of motion and neck supple.  Skin:    General: Skin is warm and dry.  Neurological:     General: No focal deficit present.     Mental Status: He is alert and oriented to person, place, and time.  Psychiatric:        Mood and Affect: Mood normal.        Behavior: Behavior normal.     (all labs ordered are listed, but only abnormal results are displayed) Labs Reviewed  URINALYSIS, ROUTINE W REFLEX MICROSCOPIC - Abnormal; Notable for the following components:      Result Value   Glucose, UA 150 (*)    Hgb urine dipstick MODERATE (*)    Ketones, ur 20 (*)    Protein, ur 30 (*)    All other components within normal limits  BASIC METABOLIC PANEL WITH GFR - Abnormal; Notable for the following components:   CO2 21 (*)    Glucose, Bld 196 (*)    Anion gap 18 (*)    All other components within normal limits  CBC - Abnormal; Notable for the following components:   WBC 13.9 (*)    RBC 6.49 (*)    Hemoglobin 19.1 (*)    HCT 55.4 (*)    All other components within normal limits  MAGNESIUM     EKG: None  Radiology: CT Renal Stone Study Result Date: 07/03/2024 EXAM: CT UROGRAM 07/03/2024 10:46:03 AM TECHNIQUE: CT of the abdomen and pelvis was performed without the administration of intravenous contrast as per CT urogram protocol. Multiplanar reformatted  images as well as MIP urogram images are provided for review. Automated exposure control, iterative reconstruction, and/or weight based adjustment of the mA/kV was utilized to reduce the radiation dose to as low as reasonably achievable. COMPARISON: 01/04/2024 CLINICAL HISTORY: Abdominal/flank pain, stone suspected FINDINGS: LOWER CHEST: No acute abnormality. LIVER: The liver is unremarkable. GALLBLADDER AND BILE DUCTS: Gallbladder is unremarkable. No biliary ductal dilatation. SPLEEN: No acute abnormality. PANCREAS: Fatty atrophy of pancreas. ADRENAL GLANDS: No acute abnormality. KIDNEYS, URETERS AND BLADDER: Obstructing 4 mm left lumbar ureteral stone with mild left hydroureteronephrosis, located at the L4-L5 level. Nonobstructing 3 mm lower left renal stone. No right renal stones. No right hydronephrosis. Normal caliber right ureter. Simple 2.1 cm upper right renal cyst, requiring no follow-up imaging. No additional ureteral stones. No perinephric or periureteral stranding. Urinary bladder is unremarkable. GI AND BOWEL: Stomach demonstrates no acute abnormality. Normal caliber small bowel. Normal appendix. No small or large bowel wall thickening. No significant colonic diverticulosis. PERITONEUM AND RETROPERITONEUM: No ascites. No free air. VASCULATURE: Aorta is normal in caliber. LYMPH NODES: No lymphadenopathy. REPRODUCTIVE ORGANS: No acute abnormality. BONES AND SOFT TISSUES: Thoracolumbar spondylosis, prominent at L5-S1. No acute osseous abnormality. No focal soft tissue abnormality. IMPRESSION: 1. Obstructing 4 mm left lumbar ureteral stone at the L4-L5 level. Mild left hydroureteronephrosis. 2. Nonobstructing 3 mm lower left renal stone. 3. Fatty atrophy of the pancreas. Electronically signed by: Selinda Blue MD 07/03/2024 10:59 AM EST RP Workstation: [id number]     Procedures   Medications Ordered in the ED  lactated ringers  bolus 1,000 mL (0 mLs Intravenous Stopped 07/03/24 1128)  HYDROmorphone   (DILAUDID ) injection 1 mg (1 mg Intravenous Given 07/03/24 1028)  ketorolac  (TORADOL ) 15 MG/ML injection 15 mg (15 mg Intravenous Given 07/03/24 1029)  ondansetron  (ZOFRAN ) injection 4 mg (  4 mg Intravenous Given 07/03/24 1028)  HYDROmorphone  (DILAUDID ) injection 1 mg (1 mg Intravenous Given 07/03/24 1215)  lactated ringers  bolus 1,000 mL (0 mLs Intravenous Stopped 07/03/24 1326)  tamsulosin  (FLOMAX ) capsule 0.4 mg (0.4 mg Oral Given 07/03/24 1220)  fentaNYL  (SUBLIMAZE ) injection 50 mcg (50 mcg Intravenous Given 07/03/24 1324)                                    Medical Decision Making Amount and/or Complexity of Data Reviewed Labs: ordered. Radiology: ordered.  Risk Prescription drug management.   This patient presents to the ED for concern of flank pain, this involves an extensive number of treatment options, and is a complaint that carries with it a high risk of complications and morbidity.  The differential diagnosis includes nephrolithiasis, pyelonephritis, colitis, constipation, skill skeletal etiology   Co morbidities / Chronic conditions that complicate the patient evaluation  HTN, OSA, DM, cyclic vomiting   Additional history obtained:  Additional history obtained from EMR External records from outside source obtained and reviewed including N/A   Lab Tests:  I Ordered, and personally interpreted labs.  The pertinent results include: Leukocytosis is present.  Erythrocytosis present consistent with prior lab work.  Kidney function and electrolytes are normal.  Urinalysis shows no evidence of UTI.   Imaging Studies ordered:  I ordered imaging studies including CT of abdomen and pelvis I independently visualized and interpreted imaging which showed obstructing 4 mm left ureteral stone with mild upstream hydroureteronephrosis.  No other acute findings. I agree with the radiologist interpretation   Cardiac Monitoring: / EKG:  The patient was maintained on a cardiac  monitor.  I personally viewed and interpreted the cardiac monitored which showed an underlying rhythm of: Sinus rhythm   Problem List / ED Course / Critical interventions / Medication management  Patient presenting for left-sided flank pain since noon yesterday.  On arrival in the ED, vital signs notable for tachycardia and hypertension.  He does have ongoing 8/10 severity pain which is likely contributing to his vital signs.  He has also had p.o. intolerance since yesterday and does suspect that he is dehydrated.  No tenderness is present on exam.  IV fluids, pain medication, and antiemetic ordered.  Workup initiated.  Lab work notable for leukocytosis.  Erythrocytosis is present but this is consistent with his prior lab work.  Notably, kidney function and electrolytes are normal.  CT imaging did show a 4 mm obstructing stone.  Fortunately, urinalysis did not show evidence of infection.  After several doses of pain medications, patient did have improved pain and resolved nausea.  He does have hydrocodone  at home to take as needed for pain.  He has a few more doses of ondansetron .  Will prescribe additional doses.  He does have tamsulosin .  Patient to follow-up with urology and to return for any worsening of symptoms.  He was discharged in stable condition. I ordered medication including IV fluids for hydration, fentanyl , Dilaudid , Toradol  for analgesia; Zofran  for nausea Reevaluation of the patient after these medicines showed that the patient improved I have reviewed the patients home medicines and have made adjustments as needed  Social Determinants of Health:  Lives independently     Final diagnoses:  Kidney stone    ED Discharge Orders          Ordered    ondansetron  (ZOFRAN -ODT) 4 MG disintegrating tablet  Every 8 hours  PRN        07/03/24 1538               Melvenia Motto, MD 07/03/24 1539  "

## 2024-07-03 NOTE — Discharge Instructions (Addendum)
 Call the urology office to schedule a close outpatient follow-up appointment.  In the meantime, drink plenty of fluids to help pass stone.  Take daily tamsulosin .  Take your home meloxicam  and hydrocodone  as needed for pain.  A prescription for additional doses of Zofran  was sent to your pharmacy.  Take as needed for nausea.  If you have uncontrolled symptoms at home or any new or worsening symptoms of concern, please return to the emergency department.

## 2024-07-03 NOTE — ED Notes (Signed)
 Patient transported to CT

## 2024-07-03 NOTE — ED Triage Notes (Signed)
 Patient come in POV for flank pain to left side that started yesterday. Vomiting and unable to keep anything down. Has a kidney stone earlier this year, and had stent placed in right kidney.

## 2024-07-03 NOTE — ED Notes (Signed)
 Pt reminded of urine specimen, stated he's not ready to give one yet.

## 2024-07-04 ENCOUNTER — Telehealth: Payer: Self-pay | Admitting: Urology

## 2024-07-04 NOTE — Telephone Encounter (Signed)
 Patient in severe pain with kidney stone, was seen in ER yesterday and told to see McKenzie, please call [phone]

## 2024-07-04 NOTE — Telephone Encounter (Signed)
 Please review OR note and CT from yesterday and advise if follow up or surgery is needed.

## 2024-07-05 ENCOUNTER — Other Ambulatory Visit: Payer: Self-pay

## 2024-07-05 NOTE — Progress Notes (Signed)
 Erroneous encounter- KUB already in

## 2024-07-05 NOTE — Telephone Encounter (Signed)
 Return call to pt and was transfer to [facility]

## 2024-07-13 ENCOUNTER — Other Ambulatory Visit (HOSPITAL_COMMUNITY): Payer: Self-pay

## 2024-07-13 ENCOUNTER — Other Ambulatory Visit: Payer: Self-pay

## 2024-07-17 ENCOUNTER — Other Ambulatory Visit (HOSPITAL_COMMUNITY): Payer: Self-pay

## 2024-07-18 ENCOUNTER — Other Ambulatory Visit (HOSPITAL_COMMUNITY): Payer: Self-pay

## 2024-07-27 ENCOUNTER — Other Ambulatory Visit (HOSPITAL_COMMUNITY): Payer: Self-pay

## 2024-08-11 ENCOUNTER — Telehealth: Payer: Self-pay | Admitting: Family Medicine

## 2024-08-11 ENCOUNTER — Ambulatory Visit: Admitting: Family Medicine

## 2024-08-11 VITALS — BP 153/99 | HR 76 | Temp 97.7°F | Ht 67.0 in | Wt 239.8 lb

## 2024-08-11 DIAGNOSIS — Z79899 Other long term (current) drug therapy: Secondary | ICD-10-CM

## 2024-08-11 DIAGNOSIS — I10 Essential (primary) hypertension: Secondary | ICD-10-CM

## 2024-08-11 DIAGNOSIS — E119 Type 2 diabetes mellitus without complications: Secondary | ICD-10-CM

## 2024-08-11 DIAGNOSIS — E7849 Other hyperlipidemia: Secondary | ICD-10-CM

## 2024-08-11 DIAGNOSIS — M25512 Pain in left shoulder: Secondary | ICD-10-CM

## 2024-08-11 NOTE — Telephone Encounter (Signed)
 Please do a referral for orthopedics Dr.Cairn [facility] [place] [facility]   Left shoulder pain

## 2024-08-11 NOTE — Progress Notes (Signed)
" ° °  Subjective:    Patient ID: Bryan Gilbert, male    DOB: [birth year], 59 y.o.   MRN: [id number]  HPI  Room 8  Pt is here for 6 month follow up  Pt states he is experiencing left shoulder pain   Over the past month intermittent certain movements will trigger it into the trapezius and into the upper deltoid no radiation down the arm no neck pain no weakness no known injury. He reports it started about 1 month ago - cannot get pain to go away with hear or stretching  Review of Systems     Objective:   Physical Exam General-in no acute distress Eyes-no discharge Lungs-respiratory rate normal, CTA CV-no murmurs,RRR Extremities skin warm dry no edema Neuro grossly normal Behavior normal, alert Shoulder positioning test as well as strength testing does not reveal any rotator cuff tears       Assessment & Plan:  1. Acute pain of left shoulder (Primary) Referral to orthopedics Dr Onesimo I do not believe the patient has a rotator cuff tear but would benefit from exercises as well as possible injections  2. Type 2 diabetes mellitus without complication, with long-term current use of insulin  (HCC) Keep A1c under good control healthy diet regular activity check A1c await results - Microalbumin / creatinine urine ratio  3. Primary hypertension Continue current measures blood pressure mildly elevated he will check blood pressure several times over the next few weeks and send us  readings Patient states he is taking in too much salt recently with some of his foods - Basic metabolic panel with GFR - Microalbumin / creatinine urine ratio  4. Other hyperlipidemia Continue current measures check lipid profile - Lipid panel, Patient cannot tolerate statins  5. High risk medication use Await labs  6. Morbid obesity (HCC) Portion control regular physical activity   "

## 2024-12-22 ENCOUNTER — Other Ambulatory Visit

## 2024-12-29 ENCOUNTER — Ambulatory Visit: Admitting: Urology

## 2025-02-09 ENCOUNTER — Ambulatory Visit: Admitting: Family Medicine
